# Patient Record
Sex: Female | Born: 1940
Health system: Southern US, Community
[De-identification: ages and names within clinical notes are randomized; demographics above are authoritative.]

## PROBLEM LIST (undated history)

## (undated) DIAGNOSIS — E119 Type 2 diabetes mellitus without complications: Secondary | ICD-10-CM

## (undated) DIAGNOSIS — N189 Chronic kidney disease, unspecified: Secondary | ICD-10-CM

## (undated) DIAGNOSIS — R55 Syncope and collapse: Secondary | ICD-10-CM

## (undated) DIAGNOSIS — K219 Gastro-esophageal reflux disease without esophagitis: Secondary | ICD-10-CM

## (undated) DIAGNOSIS — I10 Essential (primary) hypertension: Secondary | ICD-10-CM

## (undated) DIAGNOSIS — E7800 Pure hypercholesterolemia, unspecified: Secondary | ICD-10-CM

---

## 2003-11-12 ENCOUNTER — Ambulatory Visit: Payer: Self-pay | Admitting: Family Medicine

## 2004-11-16 ENCOUNTER — Ambulatory Visit: Payer: Self-pay | Admitting: Family Medicine

## 2005-11-29 ENCOUNTER — Emergency Department: Payer: Self-pay | Admitting: Emergency Medicine

## 2005-12-14 ENCOUNTER — Ambulatory Visit: Payer: Self-pay | Admitting: Family Medicine

## 2006-08-09 ENCOUNTER — Ambulatory Visit: Payer: Self-pay | Admitting: Gastroenterology

## 2006-09-13 ENCOUNTER — Ambulatory Visit: Payer: Self-pay | Admitting: Gastroenterology

## 2007-03-28 ENCOUNTER — Ambulatory Visit: Payer: Self-pay | Admitting: Family Medicine

## 2007-06-10 ENCOUNTER — Emergency Department: Payer: Self-pay | Admitting: Emergency Medicine

## 2007-06-16 ENCOUNTER — Ambulatory Visit: Payer: Self-pay | Admitting: Family Medicine

## 2008-05-24 ENCOUNTER — Ambulatory Visit: Payer: Self-pay | Admitting: Family Medicine

## 2008-06-06 DIAGNOSIS — E119 Type 2 diabetes mellitus without complications: Secondary | ICD-10-CM | POA: Insufficient documentation

## 2008-06-12 DIAGNOSIS — E1169 Type 2 diabetes mellitus with other specified complication: Secondary | ICD-10-CM | POA: Insufficient documentation

## 2008-06-15 DIAGNOSIS — N183 Chronic kidney disease, stage 3 unspecified: Secondary | ICD-10-CM | POA: Insufficient documentation

## 2008-06-18 LAB — HM PAP SMEAR: HM Pap smear: NORMAL

## 2008-06-26 DIAGNOSIS — D539 Nutritional anemia, unspecified: Secondary | ICD-10-CM | POA: Insufficient documentation

## 2008-06-26 DIAGNOSIS — E559 Vitamin D deficiency, unspecified: Secondary | ICD-10-CM | POA: Insufficient documentation

## 2008-07-05 DIAGNOSIS — E539 Vitamin B deficiency, unspecified: Secondary | ICD-10-CM | POA: Insufficient documentation

## 2008-12-31 ENCOUNTER — Ambulatory Visit: Payer: Self-pay | Admitting: Ophthalmology

## 2009-01-21 ENCOUNTER — Ambulatory Visit: Payer: Self-pay | Admitting: Ophthalmology

## 2009-01-21 HISTORY — PX: EYE SURGERY: SHX253

## 2009-03-20 ENCOUNTER — Ambulatory Visit: Payer: Self-pay | Admitting: Ophthalmology

## 2009-04-08 ENCOUNTER — Ambulatory Visit: Payer: Self-pay | Admitting: Ophthalmology

## 2009-04-08 HISTORY — PX: EYE SURGERY: SHX253

## 2009-05-02 DIAGNOSIS — R0789 Other chest pain: Secondary | ICD-10-CM | POA: Insufficient documentation

## 2009-06-10 ENCOUNTER — Ambulatory Visit: Payer: Self-pay | Admitting: Family Medicine

## 2009-09-22 ENCOUNTER — Ambulatory Visit: Payer: Self-pay | Admitting: General Practice

## 2009-10-08 ENCOUNTER — Inpatient Hospital Stay: Payer: Self-pay | Admitting: General Practice

## 2009-10-08 HISTORY — PX: REPLACEMENT TOTAL KNEE: SUR1224

## 2010-12-21 ENCOUNTER — Emergency Department: Payer: Self-pay | Admitting: Unknown Physician Specialty

## 2011-03-30 ENCOUNTER — Ambulatory Visit: Payer: Self-pay | Admitting: Family Medicine

## 2011-06-01 LAB — HM COLONOSCOPY

## 2012-04-21 ENCOUNTER — Ambulatory Visit: Payer: Self-pay | Admitting: Family Medicine

## 2013-04-23 ENCOUNTER — Ambulatory Visit: Payer: Self-pay | Admitting: Family Medicine

## 2013-06-11 LAB — LIPID PANEL
CHOLESTEROL: 201 mg/dL — AB (ref 0–200)
HDL: 70 mg/dL (ref 35–70)
LDL Cholesterol: 116 mg/dL
Triglycerides: 73 mg/dL (ref 40–160)

## 2013-10-03 LAB — TSH: TSH: 1.31 u[IU]/mL (ref 0.41–5.90)

## 2013-10-03 LAB — BASIC METABOLIC PANEL
BUN: 20 mg/dL (ref 4–21)
CREATININE: 1.2 mg/dL — AB (ref 0.5–1.1)
Glucose: 112 mg/dL
Potassium: 4.7 mmol/L (ref 3.4–5.3)
SODIUM: 138 mmol/L (ref 137–147)

## 2013-10-03 LAB — HEPATIC FUNCTION PANEL
ALT: 9 U/L (ref 7–35)
AST: 11 U/L — AB (ref 13–35)

## 2013-10-03 LAB — CBC AND DIFFERENTIAL
HEMATOCRIT: 33 % — AB (ref 36–46)
HEMOGLOBIN: 11.1 g/dL — AB (ref 12.0–16.0)
PLATELETS: 267 10*3/uL (ref 150–399)
WBC: 5.4 10*3/mL

## 2013-11-20 ENCOUNTER — Ambulatory Visit: Payer: Self-pay | Admitting: Family Medicine

## 2013-11-20 LAB — HM DEXA SCAN: HM Dexa Scan: NORMAL

## 2014-04-03 DIAGNOSIS — E119 Type 2 diabetes mellitus without complications: Secondary | ICD-10-CM | POA: Diagnosis not present

## 2014-04-03 DIAGNOSIS — I10 Essential (primary) hypertension: Secondary | ICD-10-CM | POA: Diagnosis not present

## 2014-04-03 DIAGNOSIS — E780 Pure hypercholesterolemia: Secondary | ICD-10-CM | POA: Diagnosis not present

## 2014-04-03 DIAGNOSIS — H6983 Other specified disorders of Eustachian tube, bilateral: Secondary | ICD-10-CM | POA: Diagnosis not present

## 2014-04-03 LAB — HEMOGLOBIN A1C: Hgb A1c MFr Bld: 6.7 % — AB (ref 4.0–6.0)

## 2014-05-02 ENCOUNTER — Ambulatory Visit
Admit: 2014-05-02 | Disposition: A | Discharge status: Home / Self Care | Payer: Self-pay | Attending: Family Medicine | Admitting: Family Medicine

## 2014-05-02 DIAGNOSIS — Z1231 Encounter for screening mammogram for malignant neoplasm of breast: Secondary | ICD-10-CM | POA: Diagnosis not present

## 2014-05-02 LAB — HM MAMMOGRAPHY

## 2014-05-30 DIAGNOSIS — K219 Gastro-esophageal reflux disease without esophagitis: Secondary | ICD-10-CM | POA: Insufficient documentation

## 2014-05-30 DIAGNOSIS — R202 Paresthesia of skin: Secondary | ICD-10-CM | POA: Insufficient documentation

## 2014-05-30 DIAGNOSIS — H6980 Other specified disorders of Eustachian tube, unspecified ear: Secondary | ICD-10-CM | POA: Insufficient documentation

## 2014-06-27 ENCOUNTER — Telehealth: Payer: Self-pay | Admitting: Family Medicine

## 2014-06-27 NOTE — Telephone Encounter (Signed)
Estill Bamberg from [facility] advised.   Thanks,   -Mickel Baas

## 2014-06-27 NOTE — Telephone Encounter (Signed)
Miranda Olsen with [facility] called to request the Rx for Ferrous Sulfate 325 changed to plain Ferrous Sulfate due to cost.  CB#[phone]/[id number]

## 2014-06-27 NOTE — Telephone Encounter (Signed)
Please call and give verbal approval.

## 2014-07-31 ENCOUNTER — Encounter: Payer: Self-pay | Admitting: Family Medicine

## 2014-07-31 ENCOUNTER — Ambulatory Visit (INDEPENDENT_AMBULATORY_CARE_PROVIDER_SITE_OTHER): Payer: Commercial Managed Care - HMO | Admitting: Family Medicine

## 2014-07-31 VITALS — BP 138/80 | HR 72 | Temp 98.3°F | Resp 16 | Ht 62.0 in | Wt 188.0 lb

## 2014-07-31 DIAGNOSIS — E119 Type 2 diabetes mellitus without complications: Secondary | ICD-10-CM

## 2014-07-31 DIAGNOSIS — E539 Vitamin B deficiency, unspecified: Secondary | ICD-10-CM | POA: Diagnosis not present

## 2014-07-31 DIAGNOSIS — E7800 Pure hypercholesterolemia, unspecified: Secondary | ICD-10-CM

## 2014-07-31 DIAGNOSIS — E559 Vitamin D deficiency, unspecified: Secondary | ICD-10-CM | POA: Diagnosis not present

## 2014-07-31 DIAGNOSIS — K219 Gastro-esophageal reflux disease without esophagitis: Secondary | ICD-10-CM | POA: Diagnosis not present

## 2014-07-31 DIAGNOSIS — N182 Chronic kidney disease, stage 2 (mild): Secondary | ICD-10-CM | POA: Diagnosis not present

## 2014-07-31 DIAGNOSIS — N183 Chronic kidney disease, stage 3 unspecified: Secondary | ICD-10-CM | POA: Insufficient documentation

## 2014-07-31 DIAGNOSIS — I129 Hypertensive chronic kidney disease with stage 1 through stage 4 chronic kidney disease, or unspecified chronic kidney disease: Secondary | ICD-10-CM

## 2014-07-31 DIAGNOSIS — I10 Essential (primary) hypertension: Secondary | ICD-10-CM

## 2014-07-31 LAB — POCT GLYCOSYLATED HEMOGLOBIN (HGB A1C): HEMOGLOBIN A1C: 6.4

## 2014-07-31 NOTE — Progress Notes (Signed)
Subjective:    Patient ID: Miranda Olsen, female    DOB: Aug 19, 1940, 74 y.o.   MRN: [id number]  Hypertension This is a chronic problem. The problem is unchanged. The problem is controlled. Associated symptoms include malaise/fatigue (Occasionally). Pertinent negatives include no anxiety, blurred vision, chest pain, headaches, neck pain, palpitations, peripheral edema, PND, shortness of breath or sweats. There are no compliance problems.   Hyperlipidemia This is a chronic problem. The problem is controlled. Recent lipid tests were reviewed and are high (Last Cholesterol check was 06/11/2013:  Total Cholesterol:  201;  Tri:  73:  HDL:  70;  LDL:  116.). Exacerbating diseases include diabetes. Pertinent negatives include no chest pain, myalgias or shortness of breath. There are no compliance problems.  Risk factors for coronary artery disease include diabetes mellitus and hypertension.  Diabetes She presents for her follow-up diabetic visit. She has type 2 diabetes mellitus. Her disease course has been stable. There are no hypoglycemic associated symptoms. Pertinent negatives for hypoglycemia include no dizziness, headaches, seizures, speech difficulty, sweats or tremors. Associated symptoms include fatigue (occassionally). Pertinent negatives for diabetes include no blurred vision, no chest pain, no foot ulcerations, no polydipsia, no polyphagia, no polyuria, no visual change and no weakness. Symptoms are stable. Current diabetic treatment includes oral agent (monotherapy). She is compliant with treatment all of the time. Her weight is stable. Home blood sugar record trend: Pt does not check her blood sugar at home. She does not see a podiatrist.Eye exam is not current.    Patient Active Problem List   Diagnosis Date Noted  . Dysfunction of eustachian tube 05/30/2014  . Acid reflux 05/30/2014  . Burning or prickling sensation 05/30/2014  . Atypical chest pain 05/02/2009  . Deficiency of vitamin  B 07/05/2008  . Anemia, deficiency 06/26/2008  . Avitaminosis D 06/26/2008  . Benign hypertension 06/15/2008  . Hypercholesteremia 06/12/2008  . Diabetes 06/06/2008  . Colon, diverticulosis 06/06/2008  . Apnea, sleep 01/26/2007  . History of colon polyps 08/09/2006   Family History  Problem Relation Age of Onset  . Parkinsonism Mother   . Leukemia Father   . Cancer Sister     breast  . Congestive Heart Failure Sister   . Congestive Heart Failure Sister   . Lung cancer Brother    History   Social History  . Marital Status: Widowed    Spouse Name: N/A  . Number of Children: 2  . Years of Education: H/S   Occupational History  . Retired    Social History Main Topics  . Smoking status: Never Smoker   . Smokeless tobacco: Never Used  . Alcohol Use: No  . Drug Use: No  . Sexual Activity: Not on file   Other Topics Concern  . Not on file   Social History Narrative   Past Surgical History  Procedure Laterality Date  . Replacement total knee Left 10/08/2009    Dr. Marry Guan  . Eye surgery Left 01/21/2009     cataract surgery   . Eye surgery Right 04/08/2009    cataract surgery   No Known Allergies Previous Medications   AMLODIPINE (NORVASC) 10 MG TABLET    Take by mouth.   BENAZEPRIL (LOTENSIN) 20 MG TABLET    Take by mouth.   CHOLECALCIFEROL (VITAMIN D) 1000 UNITS TABLET    Take by mouth.   CYANOCOBALAMIN 1000 MCG TABLET    Take by mouth.   FERROUS SULFATE 325 (65 FE) MG EC TABLET  Take by mouth.   FLUTICASONE (FLONASE) 50 MCG/ACT NASAL SPRAY    Place into the nose.   HYDROCHLOROTHIAZIDE (HYDRODIURIL) 25 MG TABLET    Take by mouth.   METFORMIN (GLUCOPHAGE) 1000 MG TABLET    Take by mouth.   OMEPRAZOLE (PRILOSEC) 40 MG CAPSULE    Take by mouth.   SIMVASTATIN (ZOCOR) 20 MG TABLET    Take by mouth.   BP 138/80 mmHg  Pulse 72  Temp(Src) 98.3 F (36.8 C) (Oral)  Resp 16  Ht 5\' 2"  (1.575 m)  Wt 188 lb (85.276 kg)  BMI 34.38 kg/m2     Review of Systems   Constitutional: Positive for malaise/fatigue (Occasionally) and fatigue (occassionally). Negative for fever, chills, diaphoresis, activity change, appetite change and unexpected weight change.  Eyes: Negative for blurred vision.  Respiratory: Negative.  Negative for shortness of breath.   Cardiovascular: Positive for leg swelling (Ankles and feet swell.). Negative for chest pain, palpitations and PND.  Gastrointestinal: Negative.  Negative for nausea, vomiting, abdominal pain, diarrhea, constipation, blood in stool, abdominal distention, anal bleeding and rectal pain.  Endocrine: Negative.  Negative for polydipsia, polyphagia and polyuria.  Musculoskeletal: Positive for arthralgias (History of Arthritis). Negative for myalgias, back pain, joint swelling, gait problem, neck pain and neck stiffness.  Neurological: Positive for numbness (Left hand). Negative for dizziness, tremors, seizures, syncope, facial asymmetry, speech difficulty, weakness, light-headedness and headaches.       Objective:   Physical Exam  Constitutional: She is oriented to person, place, and time. She appears well-developed and well-nourished.  HENT:  Head: Normocephalic.  Cardiovascular: Normal rate and regular rhythm.   Neurological: She is alert and oriented to person, place, and time.  Normal monofilament done today.           Assessment & Plan:  1. Benign hypertension Stable, continue current medications. Does have mild CKD and diagnosis code changed.   - TSH  2. Gastroesophageal reflux disease, esophagitis presence not specified Stable on current dose of omeprazole.   3. Type 2 diabetes mellitus without complication [id number] Stable at 6.4%, recheck again in four months. Doing well.  Follow up for Wellness in 2 months also.   - POCT HgB A1C - Comprehensive metabolic panel  4. Hypercholesteremia Stable, check labs today.   - Lipid panel  5. Deficiency of vitamin B Check labs today.  - CBC with  Differential/Platelet - Vitamin B12  6. Avitaminosis D Has not been taking her Vitamin D regularly;  Check labs today.   - Vit D  25 hydroxy (rtn osteoporosis monitoring)  Margarita Rana, MD

## 2014-08-01 ENCOUNTER — Telehealth: Payer: Self-pay

## 2014-08-01 LAB — CBC WITH DIFFERENTIAL/PLATELET
BASOS ABS: 0 10*3/uL (ref 0.0–0.2)
Basos: 0 %
EOS (ABSOLUTE): 0.1 10*3/uL (ref 0.0–0.4)
EOS: 2 %
HEMOGLOBIN: 11.7 g/dL (ref 11.1–15.9)
Hematocrit: 35.7 % (ref 34.0–46.6)
Immature Grans (Abs): 0 10*3/uL (ref 0.0–0.1)
Immature Granulocytes: 0 %
LYMPHS ABS: 1.3 10*3/uL (ref 0.7–3.1)
Lymphs: 25 %
MCH: 28.8 pg (ref 26.6–33.0)
MCHC: 32.8 g/dL (ref 31.5–35.7)
MCV: 88 fL (ref 79–97)
Monocytes Absolute: 0.4 10*3/uL (ref 0.1–0.9)
Monocytes: 8 %
Neutrophils Absolute: 3.4 10*3/uL (ref 1.4–7.0)
Neutrophils: 65 %
Platelets: 277 10*3/uL (ref 150–379)
RBC: 4.06 x10E6/uL (ref 3.77–5.28)
RDW: 13.9 % (ref 12.3–15.4)
WBC: 5.2 10*3/uL (ref 3.4–10.8)

## 2014-08-01 LAB — LIPID PANEL
CHOL/HDL RATIO: 3.1 ratio (ref 0.0–4.4)
CHOLESTEROL TOTAL: 232 mg/dL — AB (ref 100–199)
HDL: 76 mg/dL (ref >39)
LDL Calculated: 137 mg/dL — ABNORMAL HIGH (ref 0–99)
TRIGLYCERIDES: 94 mg/dL (ref 0–149)
VLDL Cholesterol Cal: 19 mg/dL (ref 5–40)

## 2014-08-01 LAB — VITAMIN B12: VITAMIN B 12: 385 pg/mL (ref 211–946)

## 2014-08-01 LAB — COMPREHENSIVE METABOLIC PANEL
A/G RATIO: 1.6 (ref 1.1–2.5)
ALT: 8 IU/L (ref 0–32)
AST: 13 IU/L (ref 0–40)
Albumin: 4.2 g/dL (ref 3.5–4.8)
Alkaline Phosphatase: 53 IU/L (ref 39–117)
BUN/Creatinine Ratio: 20 (ref 11–26)
BUN: 22 mg/dL (ref 8–27)
Bilirubin Total: 0.3 mg/dL (ref 0.0–1.2)
CO2: 24 mmol/L (ref 18–29)
Calcium: 9.9 mg/dL (ref 8.7–10.3)
Chloride: 99 mmol/L (ref 97–108)
Creatinine, Ser: 1.09 mg/dL — ABNORMAL HIGH (ref 0.57–1.00)
GFR calc Af Amer: 59 mL/min/{1.73_m2} — ABNORMAL LOW (ref >59)
GFR calc non Af Amer: 51 mL/min/{1.73_m2} — ABNORMAL LOW (ref >59)
Globulin, Total: 2.7 g/dL (ref 1.5–4.5)
Glucose: 105 mg/dL — ABNORMAL HIGH (ref 65–99)
POTASSIUM: 4.8 mmol/L (ref 3.5–5.2)
SODIUM: 139 mmol/L (ref 134–144)
Total Protein: 6.9 g/dL (ref 6.0–8.5)

## 2014-08-01 LAB — VITAMIN D 25 HYDROXY (VIT D DEFICIENCY, FRACTURES): Vit D, 25-Hydroxy: 42.1 ng/mL (ref 30.0–100.0)

## 2014-08-01 LAB — TSH: TSH: 1.13 u[IU]/mL (ref 0.450–4.500)

## 2014-08-01 NOTE — Telephone Encounter (Signed)
LMTCB 08/01/2014  Thanks,   -Mickel Baas

## 2014-08-01 NOTE — Telephone Encounter (Signed)
-----   Message from Margarita Rana, MD sent at 08/01/2014 11:20 AM EDT ----- Labs stable. Please notify patient and have her continue current medication and plan of care. Thanks.

## 2014-08-05 NOTE — Telephone Encounter (Signed)
Patient advised.

## 2014-10-02 ENCOUNTER — Other Ambulatory Visit: Payer: Self-pay | Admitting: Family Medicine

## 2014-10-02 DIAGNOSIS — D539 Nutritional anemia, unspecified: Secondary | ICD-10-CM

## 2014-10-09 ENCOUNTER — Encounter: Payer: Self-pay | Admitting: Family Medicine

## 2014-10-31 ENCOUNTER — Other Ambulatory Visit: Payer: Self-pay

## 2014-10-31 DIAGNOSIS — E7800 Pure hypercholesterolemia, unspecified: Secondary | ICD-10-CM

## 2014-10-31 MED ORDER — SIMVASTATIN 20 MG PO TABS: 20.0000 mg | ORAL_TABLET | Freq: Every day | ORAL | Status: DC

## 2014-11-29 ENCOUNTER — Encounter: Payer: Commercial Managed Care - HMO | Admitting: Family Medicine

## 2014-12-19 ENCOUNTER — Encounter: Payer: Self-pay | Admitting: Emergency Medicine

## 2014-12-19 ENCOUNTER — Emergency Department
Admission: EM | Admit: 2014-12-19 | Discharge: 2014-12-19 | Disposition: A | Discharge status: Home / Self Care | Payer: No Typology Code available for payment source | Attending: Emergency Medicine | Admitting: Emergency Medicine

## 2014-12-19 ENCOUNTER — Emergency Department: Payer: No Typology Code available for payment source

## 2014-12-19 DIAGNOSIS — Y92410 Unspecified street and highway as the place of occurrence of the external cause: Secondary | ICD-10-CM | POA: Diagnosis not present

## 2014-12-19 DIAGNOSIS — Z79899 Other long term (current) drug therapy: Secondary | ICD-10-CM | POA: Insufficient documentation

## 2014-12-19 DIAGNOSIS — S29001A Unspecified injury of muscle and tendon of front wall of thorax, initial encounter: Secondary | ICD-10-CM | POA: Diagnosis not present

## 2014-12-19 DIAGNOSIS — Y998 Other external cause status: Secondary | ICD-10-CM | POA: Insufficient documentation

## 2014-12-19 DIAGNOSIS — R0789 Other chest pain: Secondary | ICD-10-CM | POA: Diagnosis not present

## 2014-12-19 DIAGNOSIS — I129 Hypertensive chronic kidney disease with stage 1 through stage 4 chronic kidney disease, or unspecified chronic kidney disease: Secondary | ICD-10-CM | POA: Insufficient documentation

## 2014-12-19 DIAGNOSIS — N183 Chronic kidney disease, stage 3 (moderate): Secondary | ICD-10-CM | POA: Diagnosis not present

## 2014-12-19 DIAGNOSIS — S299XXA Unspecified injury of thorax, initial encounter: Secondary | ICD-10-CM

## 2014-12-19 DIAGNOSIS — E119 Type 2 diabetes mellitus without complications: Secondary | ICD-10-CM | POA: Diagnosis not present

## 2014-12-19 DIAGNOSIS — Y9389 Activity, other specified: Secondary | ICD-10-CM | POA: Diagnosis not present

## 2014-12-19 HISTORY — DX: Essential (primary) hypertension: I10

## 2014-12-19 HISTORY — DX: Pure hypercholesterolemia, unspecified: E78.00

## 2014-12-19 HISTORY — DX: Type 2 diabetes mellitus without complications: E11.9

## 2014-12-19 MED ORDER — TRAMADOL HCL 50 MG PO TABS: 50.0000 mg | ORAL_TABLET | Freq: Four times a day (QID) | ORAL | Status: DC | PRN

## 2014-12-19 MED ORDER — IBUPROFEN 600 MG PO TABS
600.0000 mg | ORAL_TABLET | Freq: Once | ORAL | Status: AC
  Administered 2014-12-19: 600 mg via ORAL
  Filled 2014-12-19: qty 1
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
## 2014-12-24 ENCOUNTER — Encounter: Payer: Self-pay | Admitting: Family Medicine

## 2014-12-24 ENCOUNTER — Ambulatory Visit (INDEPENDENT_AMBULATORY_CARE_PROVIDER_SITE_OTHER): Payer: Commercial Managed Care - HMO | Admitting: Family Medicine

## 2014-12-24 VITALS — BP 120/80 | HR 86 | Temp 98.5°F | Resp 16 | Ht 62.0 in | Wt 190.0 lb

## 2014-12-24 DIAGNOSIS — N182 Chronic kidney disease, stage 2 (mild): Secondary | ICD-10-CM | POA: Diagnosis not present

## 2014-12-24 DIAGNOSIS — R42 Dizziness and giddiness: Secondary | ICD-10-CM | POA: Diagnosis not present

## 2014-12-24 DIAGNOSIS — I129 Hypertensive chronic kidney disease with stage 1 through stage 4 chronic kidney disease, or unspecified chronic kidney disease: Secondary | ICD-10-CM | POA: Diagnosis not present

## 2014-12-24 DIAGNOSIS — H8110 Benign paroxysmal vertigo, unspecified ear: Secondary | ICD-10-CM | POA: Diagnosis not present

## 2014-12-24 LAB — GLUCOSE, POCT (MANUAL RESULT ENTRY): POC GLUCOSE: 123 mg/dL — AB (ref 70–99)

## 2014-12-24 MED ORDER — MECLIZINE HCL 25 MG PO TABS: 25.0000 mg | ORAL_TABLET | Freq: Three times a day (TID) | ORAL | Status: DC | PRN
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
## 2015-01-25 ENCOUNTER — Encounter: Payer: Self-pay | Admitting: Internal Medicine

## 2015-01-25 ENCOUNTER — Inpatient Hospital Stay: Payer: Medicare HMO

## 2015-01-25 ENCOUNTER — Inpatient Hospital Stay
Admission: EM | Admit: 2015-01-25 | Discharge: 2015-01-29 | DRG: 378 | Disposition: A | Discharge status: Home / Self Care | Payer: Medicare HMO | Attending: Internal Medicine | Admitting: Internal Medicine

## 2015-01-25 DIAGNOSIS — Z86010 Personal history of colonic polyps: Secondary | ICD-10-CM | POA: Diagnosis not present

## 2015-01-25 DIAGNOSIS — Z806 Family history of leukemia: Secondary | ICD-10-CM | POA: Diagnosis not present

## 2015-01-25 DIAGNOSIS — R197 Diarrhea, unspecified: Secondary | ICD-10-CM | POA: Diagnosis present

## 2015-01-25 DIAGNOSIS — Z801 Family history of malignant neoplasm of trachea, bronchus and lung: Secondary | ICD-10-CM

## 2015-01-25 DIAGNOSIS — E7800 Pure hypercholesterolemia, unspecified: Secondary | ICD-10-CM | POA: Diagnosis present

## 2015-01-25 DIAGNOSIS — K640 First degree hemorrhoids: Secondary | ICD-10-CM | POA: Diagnosis present

## 2015-01-25 DIAGNOSIS — E119 Type 2 diabetes mellitus without complications: Secondary | ICD-10-CM | POA: Diagnosis not present

## 2015-01-25 DIAGNOSIS — E785 Hyperlipidemia, unspecified: Secondary | ICD-10-CM | POA: Diagnosis present

## 2015-01-25 DIAGNOSIS — K649 Unspecified hemorrhoids: Secondary | ICD-10-CM | POA: Diagnosis not present

## 2015-01-25 DIAGNOSIS — N289 Disorder of kidney and ureter, unspecified: Secondary | ICD-10-CM | POA: Diagnosis present

## 2015-01-25 DIAGNOSIS — D62 Acute posthemorrhagic anemia: Secondary | ICD-10-CM | POA: Diagnosis present

## 2015-01-25 DIAGNOSIS — Z820 Family history of epilepsy and other diseases of the nervous system: Secondary | ICD-10-CM | POA: Diagnosis not present

## 2015-01-25 DIAGNOSIS — Z8249 Family history of ischemic heart disease and other diseases of the circulatory system: Secondary | ICD-10-CM | POA: Diagnosis not present

## 2015-01-25 DIAGNOSIS — K625 Hemorrhage of anus and rectum: Secondary | ICD-10-CM | POA: Diagnosis not present

## 2015-01-25 DIAGNOSIS — K922 Gastrointestinal hemorrhage, unspecified: Secondary | ICD-10-CM

## 2015-01-25 DIAGNOSIS — I10 Essential (primary) hypertension: Secondary | ICD-10-CM | POA: Diagnosis present

## 2015-01-25 DIAGNOSIS — K5790 Diverticulosis of intestine, part unspecified, without perforation or abscess without bleeding: Secondary | ICD-10-CM | POA: Diagnosis not present

## 2015-01-25 DIAGNOSIS — Z7982 Long term (current) use of aspirin: Secondary | ICD-10-CM | POA: Diagnosis not present

## 2015-01-25 DIAGNOSIS — D649 Anemia, unspecified: Secondary | ICD-10-CM | POA: Diagnosis not present

## 2015-01-25 DIAGNOSIS — R1030 Lower abdominal pain, unspecified: Secondary | ICD-10-CM | POA: Diagnosis not present

## 2015-01-25 DIAGNOSIS — K5731 Diverticulosis of large intestine without perforation or abscess with bleeding: Principal | ICD-10-CM | POA: Diagnosis present

## 2015-01-25 DIAGNOSIS — K5730 Diverticulosis of large intestine without perforation or abscess without bleeding: Secondary | ICD-10-CM | POA: Diagnosis not present

## 2015-01-25 LAB — CBC
HCT: 31.6 % — ABNORMAL LOW (ref 35.0–47.0)
Hemoglobin: 10.6 g/dL — ABNORMAL LOW (ref 12.0–16.0)
MCH: 29.4 pg (ref 26.0–34.0)
MCHC: 33.4 g/dL (ref 32.0–36.0)
MCV: 88 fL (ref 80.0–100.0)
PLATELETS: 247 10*3/uL (ref 150–440)
RBC: 3.58 MIL/uL — AB (ref 3.80–5.20)
RDW: 13.9 % (ref 11.5–14.5)
WBC: 6.9 10*3/uL (ref 3.6–11.0)

## 2015-01-25 LAB — COMPREHENSIVE METABOLIC PANEL
ALK PHOS: 49 U/L (ref 38–126)
ALT: 11 U/L — AB (ref 14–54)
AST: 12 U/L — ABNORMAL LOW (ref 15–41)
Albumin: 4.1 g/dL (ref 3.5–5.0)
Anion gap: 7 (ref 5–15)
BILIRUBIN TOTAL: 0.5 mg/dL (ref 0.3–1.2)
BUN: 30 mg/dL — ABNORMAL HIGH (ref 6–20)
CALCIUM: 9.3 mg/dL (ref 8.9–10.3)
CHLORIDE: 107 mmol/L (ref 101–111)
CO2: 27 mmol/L (ref 22–32)
CREATININE: 1.38 mg/dL — AB (ref 0.44–1.00)
GFR, EST AFRICAN AMERICAN: 43 mL/min — AB (ref >60)
GFR, EST NON AFRICAN AMERICAN: 37 mL/min — AB (ref >60)
Glucose, Bld: 115 mg/dL — ABNORMAL HIGH (ref 65–99)
Potassium: 4.7 mmol/L (ref 3.5–5.1)
Sodium: 141 mmol/L (ref 135–145)
TOTAL PROTEIN: 7.2 g/dL (ref 6.5–8.1)

## 2015-01-25 LAB — ABO/RH: ABO/RH(D): O POS

## 2015-01-25 LAB — HEMOGLOBIN: Hemoglobin: 8.9 g/dL — ABNORMAL LOW (ref 12.0–16.0)

## 2015-01-25 LAB — PREPARE RBC (CROSSMATCH)

## 2015-01-25 MED ORDER — PANTOPRAZOLE SODIUM 40 MG IV SOLR: 40.0000 mg | Freq: Two times a day (BID) | INTRAVENOUS | Status: DC

## 2015-01-25 MED ORDER — SODIUM CHLORIDE 0.9 % IV SOLN
INTRAVENOUS | Status: DC
  Administered 2015-01-26 – 2015-01-28 (×5): via INTRAVENOUS

## 2015-01-25 MED ORDER — INSULIN ASPART 100 UNIT/ML ~~LOC~~ SOLN: 0.0000 [IU] | Freq: Every day | SUBCUTANEOUS | Status: DC

## 2015-01-25 MED ORDER — TECHNETIUM TC 99M-LABELED RED BLOOD CELLS IV KIT
20.0000 | PACK | Freq: Once | INTRAVENOUS | Status: AC | PRN
  Administered 2015-01-25: 21.69 via INTRAVENOUS

## 2015-01-25 MED ORDER — SODIUM CHLORIDE 0.9 % IJ SOLN
3.0000 mL | Freq: Two times a day (BID) | INTRAMUSCULAR | Status: DC
  Administered 2015-01-26 – 2015-01-28 (×3): 3 mL via INTRAVENOUS

## 2015-01-25 MED ORDER — INSULIN ASPART 100 UNIT/ML ~~LOC~~ SOLN: 0.0000 [IU] | Freq: Three times a day (TID) | SUBCUTANEOUS | Status: DC

## 2015-01-25 MED ORDER — PANTOPRAZOLE SODIUM 40 MG IV SOLR
80.0000 mg | Freq: Once | INTRAVENOUS | Status: AC
  Administered 2015-01-25: 80 mg via INTRAVENOUS
  Filled 2015-01-25: qty 80

## 2015-01-25 MED ORDER — SODIUM CHLORIDE 0.9 % IV BOLUS (SEPSIS)
1000.0000 mL | Freq: Once | INTRAVENOUS | Status: AC
  Administered 2015-01-25: 1000 mL via INTRAVENOUS

## 2015-01-25 MED ORDER — ACETAMINOPHEN 325 MG PO TABS
650.0000 mg | ORAL_TABLET | Freq: Four times a day (QID) | ORAL | Status: DC | PRN
  Administered 2015-01-27: 650 mg via ORAL
  Filled 2015-01-25: qty 2

## 2015-01-25 MED ORDER — SODIUM CHLORIDE 0.9 % IV SOLN
8.0000 mg/h | INTRAVENOUS | Status: DC
  Administered 2015-01-25 – 2015-01-26 (×3): 8 mg/h via INTRAVENOUS
  Filled 2015-01-25 (×5): qty 80

## 2015-01-25 MED ORDER — ACETAMINOPHEN 650 MG RE SUPP: 650.0000 mg | Freq: Four times a day (QID) | RECTAL | Status: DC | PRN

## 2015-01-25 MED ORDER — SODIUM CHLORIDE 0.9 % IV SOLN
Freq: Once | INTRAVENOUS | Status: AC
  Administered 2015-01-25: 20:00:00 via INTRAVENOUS
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
## 2015-01-25 NOTE — ED Provider Notes (Signed)
[facility] Emergency Department Provider Note   ____________________________________________  Time seen: Approximately 5 PM I have reviewed the triage vital signs and the triage nursing note.  HISTORY  Chief Complaint Rectal Bleeding and Diarrhea   Historian Patient, daughter and other family members  HPI Miranda Olsen is a 74 y.o. female with no history of GI bleeding, is here after multiple episodes of dark red blood per rectum today. First episode was this morning around 8 AM, when she thought she had a diarrheal movement but on the toilet paper she saw blood. She had flushed before she saw the amount of blood. She did have another episode this afternoon which was fair amount of dark red blood. In the emergency department waiting room she did have another fairly large episode.  Denies nausea, or vomiting. She does have some mid and lower abdominal cramping which is mild. No exacerbating or alleviating factors. No recent fevers. No trouble breathing or shortness of breath or chest pain.  Does take a baby aspirin no other blood thinners.    Past Medical History  Diagnosis Date  . Hypertension   . Diabetes mellitus without complication ([place])   . Hypercholesterolemia     Patient Active Problem List   Diagnosis Date Noted  . Chronic kidney disease (CKD), stage III (moderate) 07/31/2014  . Dysfunction of eustachian tube 05/30/2014  . Acid reflux 05/30/2014  . Burning or prickling sensation 05/30/2014  . Deficiency of vitamin B 07/05/2008  . Anemia, deficiency 06/26/2008  . Avitaminosis D 06/26/2008  . Benign hypertension with CKD (chronic kidney disease), stage II 06/15/2008  . Hypercholesteremia 06/12/2008  . Diabetes ([place]) 06/06/2008  . Colon, diverticulosis 06/06/2008  . Apnea, sleep 01/26/2007  . History of colon polyps 08/09/2006    Past Surgical History  Procedure Laterality Date  . Replacement total knee Left 10/08/2009    Dr. Marry Guan   . Eye surgery Left 01/21/2009     cataract surgery   . Eye surgery Right 04/08/2009    cataract surgery    Current Outpatient Rx  Name  Route  Sig  Dispense  Refill  . amLODipine (NORVASC) 10 MG tablet   Oral   Take 10 mg by mouth daily.          . benazepril (LOTENSIN) 20 MG tablet   Oral   Take 20 mg by mouth 2 (two) times daily.          . cholecalciferol (VITAMIN D) 1000 UNITS tablet   Oral   Take 1,000 Units by mouth daily.          . cyanocobalamin 1000 MCG tablet   Oral   Take 100 mcg by mouth daily.          . ferrous sulfate 325 (65 FE) MG tablet      TAKE 1 TABLET BY MOUTH EVERY DAY   90 tablet   3   . fluticasone (FLONASE) 50 MCG/ACT nasal spray   Nasal   Place into the nose.         . hydrochlorothiazide (HYDRODIURIL) 25 MG tablet   Oral   Take 25 mg by mouth daily.          . meclizine (ANTIVERT) 25 MG tablet   Oral   Take 1 tablet (25 mg total) by mouth 3 (three) times daily as needed for dizziness.   30 tablet   0   . metFORMIN (GLUCOPHAGE) 1000 MG tablet   Oral  Take 1,000 mg by mouth 2 (two) times daily with a meal.          . omeprazole (PRILOSEC) 40 MG capsule   Oral   Take 40 mg by mouth daily.          . simvastatin (ZOCOR) 20 MG tablet   Oral   Take 1 tablet (20 mg total) by mouth at bedtime.   90 tablet   3     Allergies Review of patient's allergies indicates no known allergies.  Family History  Problem Relation Age of Onset  . Parkinsonism Mother   . Leukemia Father   . Cancer Sister     breast  . Congestive Heart Failure Sister   . Congestive Heart Failure Sister   . Lung cancer Brother     Social History Social History  Substance Use Topics  . Smoking status: Never Smoker   . Smokeless tobacco: Never Used  . Alcohol Use: No    Review of Systems  Constitutional: Negative for fever. Eyes: Negative for visual changes. ENT: Negative for sore throat. Cardiovascular: Negative for chest  pain. Respiratory: Negative for shortness of breath. Gastrointestinal: As per history of present illness. Genitourinary: Negative for dysuria. Musculoskeletal: Negative for back pain. Skin: Negative for rash. Neurological: Negative for headache. 10 point Review of Systems otherwise negative ____________________________________________   PHYSICAL EXAM:  VITAL SIGNS: ED Triage Vitals  Enc Vitals Group     BP 01/25/15 1347 163/101 mmHg     Pulse Rate 01/25/15 1347 107     Resp 01/25/15 1347 18     Temp 01/25/15 1347 98.2 F (36.8 C)     Temp Source 01/25/15 1347 Oral     SpO2 01/25/15 1347 97 %     Weight 01/25/15 1347 190 lb (86.183 kg)     Height 01/25/15 1347 5\' 2"  (1.575 m)     Head Cir --      Peak Flow --      Pain Score --      Pain Loc --      Pain Edu? --      Excl. in [place]? --      Constitutional: Alert and oriented. Well appearing and in no distress. Eyes: Conjunctivae are normal. PERRL. Normal extraocular movements. ENT   Head: Normocephalic and atraumatic.   Nose: No congestion/rhinnorhea.   Mouth/Throat: Mucous membranes are moist.   Neck: No stridor. Cardiovascular/Chest: Normal rate, regular rhythm.  No murmurs, rubs, or gallops. Respiratory: Normal respiratory effort without tachypnea nor retractions. Breath sounds are clear and equal bilaterally. No wheezes/rales/rhonchi. Gastrointestinal: Soft. No distention, no guarding, no rebound. Obese mild lower abdominal tenderness.  Genitourinary/rectal: Deferred rectal visualization.  Large melena witnessed in toilet. Musculoskeletal: Nontender with normal range of motion in all extremities. No joint effusions.  No lower extremity tenderness.  No edema. Neurologic:  Normal speech and language. No gross or focal neurologic deficits are appreciated. Skin:  Skin is warm, dry and intact. No rash noted. Psychiatric: Mood and affect are normal. Speech and behavior are normal. Patient exhibits appropriate  insight and judgment.  ____________________________________________   EKG I, Lisa Roca, MD, the attending physician have personally viewed and interpreted all ECGs.   96 bpm. Normal sinus rhythm. Narrow QRS. Normal axis. Nonspecific ST and T-wave ____________________________________________  LABS (pertinent positives/negatives)  Comprehensive metabolic panel significant for BUN 30 and creatinine 1.38 White blood count 6.9, hemoglobin 10.6 and platelet count 47 Troponin pending  ____________________________________________  RADIOLOGY All Xrays  were viewed by me. Imaging interpreted by Radiologist.  None __________________________________________  PROCEDURES  Procedure(s) performed: None  Critical Care performed: None  ____________________________________________   ED COURSE / ASSESSMENT AND PLAN  CONSULTATIONS: Hospitalist for admission, discussed face-to-face with Dr.Weiting  Pertinent labs & imaging results that were available during my care of the patient were reviewed by me and considered in my medical decision making (see chart for details).  Patient has stable vital signs when I see her, however she did have tachycardia on triage. When I witnessed the melena episode, it is fairly concerning especially that she's had 2 episodes before the blood counts were drawn, and she's had 2 episodes since then. She is also having some cramping now, making me concerned that she may have another one.  She does consent to blood transfusion if needed an emergency situation. At this point I am not ordering it, however if she has additional large bloody bowel movement or any abnormal vital signs, she will need emergency blood.    Patient / Family / Caregiver informed of clinical course, medical decision-making process, and agree with plan.     ___________________________________________   FINAL CLINICAL IMPRESSION(S) / ED DIAGNOSES   Final diagnoses:  Acute GI bleeding        Lisa Roca, MD 01/25/15 1753

## 2015-01-25 NOTE — ED Notes (Signed)
Pt reports 3 episodes of bloody stools.  Takes 81 mg ASA daily.

## 2015-01-25 NOTE — ED Notes (Signed)
Dr. Reita Cliche at bedside to evaluate and discuss plan of care.

## 2015-01-25 NOTE — ED Notes (Signed)
No changes in patient status.  Remains in nuclear med with RN on monitor with protonix and blood transfusing

## 2015-01-25 NOTE — ED Notes (Signed)
Pt was assisted out of bed to commode.   RN remained at bedside,   While on commode pt c/o feeling slightly dizzy she became diaphoretic.   She remained alert and was able to finish her BM,  The stretcher was rolled to her at the commode and she was assisted back onto the stretcher.  And reports no complaints once she is back on the stretcher. She produced approx 350 ml of maroon colored blood.   Dr Leslye Peer was notified of patient episode- output charted in flow sheet

## 2015-01-25 NOTE — Progress Notes (Signed)
Patient ID: Miranda Olsen, female   DOB: July 20, 1940, 74 y.o.   MRN: [id number]  Spoke with Dr. Rayann Heman gastroenterology. We will get a bleeding scan. The nurse drew off another hemoglobin. The patient was actively having another episode of bleeding at this point. I will order 1 unit of blood transfusion. Patient will be admitted to the CCU stepdown for close monitoring.  Dictated by Dr. Loletha Grayer

## 2015-01-25 NOTE — ED Notes (Signed)
Pt going to nuclear med after blood started to get scanned before

## 2015-01-25 NOTE — ED Notes (Signed)
Pt remains in nuclear medicine,   Nurse from CCU came down to assume care and she will be transporting patient to CCU when study is complete.

## 2015-01-25 NOTE — ED Notes (Signed)
Admitting physician at bedside

## 2015-01-25 NOTE — H&P (Signed)
[place] [facility] [place] NAME: Miranda Olsen    MR#:  [id number]  DATE OF BIRTH:  Sep 04, 1940  DATE OF ADMISSION:  01/25/2015  PRIMARY CARE PHYSICIAN: Margarita Rana, MD   REQUESTING/REFERRING PHYSICIAN: Lisa Roca  CHIEF COMPLAINT:   Chief Complaint  Patient presents with  . Rectal Bleeding  . Diarrhea    HISTORY OF PRESENT ILLNESS:  Miranda Olsen  is a 74 y.o. female presented after 3 bleeding episodes today first started at 8 AM. The next was after that and the next was this afternoon. She had 2 more episodes of bleeding here in the hospital with one in the lobby and again one here in the emergency room. Burgundy color to the bleeding. She describes abdominal cramping but not pain prior to the episode. No previous bleeding before. She has had a history of colon polyps in the past. She does take aspirin on a daily basis. First hemoglobin was 10.6 but that was prior to the 2 episodes of bleeding here in the hospital.  PAST MEDICAL HISTORY:   Past Medical History  Diagnosis Date  . Hypertension   . Diabetes mellitus without complication ([place])   . Hypercholesterolemia     PAST SURGICAL HISTORY:   Past Surgical History  Procedure Laterality Date  . Replacement total knee Left 10/08/2009    Dr. Marry Guan  . Eye surgery Left 01/21/2009     cataract surgery   . Eye surgery Right 04/08/2009    cataract surgery    SOCIAL HISTORY:   Social History  Substance Use Topics  . Smoking status: Never Smoker   . Smokeless tobacco: Never Used  . Alcohol Use: 0.6 oz/week    1 Glasses of wine per week    FAMILY HISTORY:   Family History  Problem Relation Age of Onset  . Parkinsonism Mother   . Leukemia Father   . Cancer Sister     breast  . Congestive Heart Failure Sister   . Congestive Heart Failure Sister   . Lung cancer Brother     DRUG ALLERGIES:  No Known Allergies  REVIEW OF SYSTEMS:  CONSTITUTIONAL: No fever.  Positive for fatigue. EYES: No blurred or double vision. Wears glasses EARS, NOSE, AND THROAT: No tinnitus or ear pain. No sore throat RESPIRATORY: No cough, occasional shortness of breath, no wheezing or hemoptysis.  CARDIOVASCULAR: No chest pain, orthopnea, edema.  GASTROINTESTINAL: No nausea, 2 weeks ago she vomited, positive for abdominal cramping. Positive for diarrhea and [place] colored stool GENITOURINARY: No dysuria, hematuria.  ENDOCRINE: No polyuria, nocturia,  HEMATOLOGY: No anemia, easy bruising or bleeding SKIN: No rash or lesion. MUSCULOSKELETAL: Some knee and shoulder pain  NEUROLOGIC: No tingling, numbness, weakness.  PSYCHIATRY: No anxiety or depression.   MEDICATIONS AT HOME:   Prior to Admission medications   Medication Sig Start Date End Date Taking? Authorizing Provider  amLODipine (NORVASC) 10 MG tablet Take 10 mg by mouth daily.  04/03/14  Yes Historical Provider, MD  benazepril (LOTENSIN) 20 MG tablet Take 40 mg by mouth every morning.  06/11/13  Yes Historical Provider, MD  cholecalciferol (VITAMIN D) 1000 UNITS tablet Take 1,000 Units by mouth daily.  03/25/12  Yes Historical Provider, MD  cyanocobalamin 1000 MCG tablet Take 100 mcg by mouth daily.    Yes Historical Provider, MD  ferrous sulfate 325 (65 FE) MG tablet TAKE 1 TABLET BY MOUTH EVERY DAY 10/02/14  Yes Margarita Rana, MD  hydrochlorothiazide (HYDRODIURIL) 25  MG tablet Take 25 mg by mouth daily.  04/07/14  Yes Historical Provider, MD  metFORMIN (GLUCOPHAGE) 1000 MG tablet Take 1,000 mg by mouth 2 (two) times daily with a meal.  05/18/14  Yes Historical Provider, MD  omeprazole (PRILOSEC) 40 MG capsule Take 40 mg by mouth daily.  04/09/14  Yes Historical Provider, MD  simvastatin (ZOCOR) 20 MG tablet Take 1 tablet (20 mg total) by mouth at bedtime. Patient taking differently: Take 20 mg by mouth every morning.  10/31/14  Yes Margarita Rana, MD  aspirin EC 81 MG tablet Take 81 mg by mouth daily.    Historical  Provider, MD      VITAL SIGNS:  Blood pressure 129/79, pulse 98, temperature 98.2 F (36.8 C), temperature source Oral, resp. rate 18, height 5\' 2"  (1.575 m), weight 86.183 kg (190 lb), SpO2 99 %.  PHYSICAL EXAMINATION:  GENERAL:  74 y.o.-year-old patient lying in the bed with no acute distress.  EYES: Pupils equal, round, reactive to light and accommodation. No scleral icterus. Extraocular muscles intact.  HEENT: Head atraumatic, normocephalic. Oropharynx and nasopharynx clear.  NECK:  Supple, no jugular venous distention. No thyroid enlargement, no tenderness.  LUNGS: Normal breath sounds bilaterally, no wheezing, rales,rhonchi or crepitation. No use of accessory muscles of respiration.  CARDIOVASCULAR: S1, S2 normal. No murmurs, rubs, or gallops.  ABDOMEN: Soft, nontender, nondistended. Bowel sounds present. No organomegaly or mass.  EXTREMITIES: No pedal edema, cyanosis, or clubbing.  NEUROLOGIC: Cranial nerves II through XII are intact. Muscle strength 5/5 in all extremities. Sensation intact. Gait not checked.  PSYCHIATRIC: The patient is alert and oriented x 3.  SKIN: No rash, lesion, or ulcer.   LABORATORY PANEL:   CBC  Recent Labs Lab 01/25/15 1354  WBC 6.9  HGB 10.6*  HCT 31.6*  PLT 247   ------------------------------------------------------------------------------------------------------------------  Chemistries   Recent Labs Lab 01/25/15 1354  NA 141  K 4.7  CL 107  CO2 27  GLUCOSE 115*  BUN 30*  CREATININE 1.38*  CALCIUM 9.3  AST 12*  ALT 11*  ALKPHOS 49  BILITOT 0.5   ------------------------------------------------------------------------------------------------------------------   EKG:   Normal sinus rhythm 96 bpm left axis deviation  IMPRESSION AND PLAN:   1. Acute blood loss anemia with gastrointestinal hemorrhage. Blood is burgundy color so it's likely an upper source. Patient will be started on a Protonix bolus and drip. I will stop  aspirin. Serial hemoglobins ordered. Patient agreed to blood transfusion if needed. I explained the benefits and risks of transfusion and answered all questions. I am paging Dr. Rayann Heman gastroenterology. I will leave nothing by mouth after midnight. Will admit to the CCU stepdown for right now. Can consider a bleeding scan if continues to bleed. 2. Hypertension- for right now can continue blood pressure medications 3. Hyperlipidemia unspecified continue statin 4. Type 2 diabetes without complication. Hold Glucophage for right now and put on sliding scale  All the records are reviewed and case discussed with ED provider. Management plans discussed with the patient, family and they are in agreement.  CODE STATUS: Full code  TOTAL TIME TAKING CARE OF THIS PATIENT: 55 minutes.    Loletha Grayer M.D on 01/25/2015 at 6:22 PM  Between 7am to 6pm - Pager - [phone]  After 6pm call admission pager [facility]  Office  [phone]  CC: Primary care physician; Margarita Rana, MD

## 2015-01-26 LAB — URINALYSIS COMPLETE WITH MICROSCOPIC (ARMC ONLY)
Bacteria, UA: NONE SEEN
Bilirubin Urine: NEGATIVE
GLUCOSE, UA: NEGATIVE mg/dL
KETONES UR: NEGATIVE mg/dL
LEUKOCYTES UA: NEGATIVE
NITRITE: NEGATIVE
Protein, ur: NEGATIVE mg/dL
SPECIFIC GRAVITY, URINE: 1.013 (ref 1.005–1.030)
pH: 5 (ref 5.0–8.0)

## 2015-01-26 LAB — BASIC METABOLIC PANEL
Anion gap: 4 — ABNORMAL LOW (ref 5–15)
BUN: 26 mg/dL — AB (ref 6–20)
CO2: 23 mmol/L (ref 22–32)
CREATININE: 1.03 mg/dL — AB (ref 0.44–1.00)
Calcium: 8.2 mg/dL — ABNORMAL LOW (ref 8.9–10.3)
Chloride: 111 mmol/L (ref 101–111)
GFR calc Af Amer: >60 mL/min (ref >60)
GFR, EST NON AFRICAN AMERICAN: 53 mL/min — AB (ref >60)
GLUCOSE: 107 mg/dL — AB (ref 65–99)
Potassium: 3.9 mmol/L (ref 3.5–5.1)
SODIUM: 138 mmol/L (ref 135–145)

## 2015-01-26 LAB — HEMOGLOBIN
HEMOGLOBIN: 7.1 g/dL — AB (ref 12.0–16.0)
Hemoglobin: 8.9 g/dL — ABNORMAL LOW (ref 12.0–16.0)

## 2015-01-26 LAB — CBC
HEMATOCRIT: 26.4 % — AB (ref 35.0–47.0)
Hemoglobin: 8.9 g/dL — ABNORMAL LOW (ref 12.0–16.0)
MCH: 29.1 pg (ref 26.0–34.0)
MCHC: 33.6 g/dL (ref 32.0–36.0)
MCV: 86.6 fL (ref 80.0–100.0)
PLATELETS: 180 10*3/uL (ref 150–440)
RBC: 3.04 MIL/uL — AB (ref 3.80–5.20)
RDW: 13.8 % (ref 11.5–14.5)
WBC: 7.4 10*3/uL (ref 3.6–11.0)

## 2015-01-26 LAB — GLUCOSE, CAPILLARY
GLUCOSE-CAPILLARY: 93 mg/dL (ref 65–99)
GLUCOSE-CAPILLARY: 91 mg/dL (ref 65–99)
Glucose-Capillary: 107 mg/dL — ABNORMAL HIGH (ref 65–99)
Glucose-Capillary: 200 mg/dL — ABNORMAL HIGH (ref 65–99)

## 2015-01-26 LAB — C DIFFICILE QUICK SCREEN W PCR REFLEX
C Diff antigen: NEGATIVE
C Diff interpretation: NEGATIVE
C Diff toxin: NEGATIVE

## 2015-01-26 LAB — MRSA PCR SCREENING: MRSA BY PCR: NEGATIVE

## 2015-01-26 LAB — PREPARE RBC (CROSSMATCH)

## 2015-01-26 MED ORDER — POLYETHYLENE GLYCOL 3350 17 GM/SCOOP PO POWD
1.0000 | Freq: Once | ORAL | Status: AC
  Administered 2015-01-26: 255 g via ORAL
  Filled 2015-01-26: qty 255

## 2015-01-26 MED ORDER — ONDANSETRON HCL 4 MG/2ML IJ SOLN
INTRAMUSCULAR | Status: AC
  Administered 2015-01-26: 21:00:00
  Filled 2015-01-26: qty 2

## 2015-01-26 MED ORDER — BENAZEPRIL HCL 20 MG PO TABS
40.0000 mg | ORAL_TABLET | ORAL | Status: DC
  Administered 2015-01-26 – 2015-01-29 (×4): 40 mg via ORAL
  Filled 2015-01-26 (×4): qty 2

## 2015-01-26 MED ORDER — SIMVASTATIN 20 MG PO TABS
20.0000 mg | ORAL_TABLET | ORAL | Status: DC
  Administered 2015-01-26 – 2015-01-29 (×4): 20 mg via ORAL
  Filled 2015-01-26 (×4): qty 1

## 2015-01-26 MED ORDER — VITAMIN D 1000 UNITS PO TABS
1000.0000 [IU] | ORAL_TABLET | Freq: Every day | ORAL | Status: DC
Start: 2015-01-26 — End: 2015-01-29
  Administered 2015-01-26 – 2015-01-29 (×3): 1000 [IU] via ORAL
  Filled 2015-01-26 (×3): qty 1

## 2015-01-26 MED ORDER — POLYETHYLENE GLYCOL 3350 17 GM/SCOOP PO POWD
1.0000 | Freq: Once | ORAL | Status: AC
  Administered 2015-01-27: 255 g via ORAL
  Filled 2015-01-26: qty 255

## 2015-01-26 MED ORDER — AMLODIPINE BESYLATE 10 MG PO TABS
10.0000 mg | ORAL_TABLET | Freq: Every day | ORAL | Status: DC
  Administered 2015-01-26 – 2015-01-29 (×3): 10 mg via ORAL
  Filled 2015-01-26 (×3): qty 1

## 2015-01-26 MED ORDER — SODIUM CHLORIDE 0.9 % IV SOLN
Freq: Once | INTRAVENOUS | Status: AC
  Administered 2015-01-26: 22:00:00 via INTRAVENOUS

## 2015-01-26 MED ORDER — VITAMIN B-12 100 MCG PO TABS
100.0000 ug | ORAL_TABLET | Freq: Every day | ORAL | Status: DC
  Administered 2015-01-26 – 2015-01-29 (×3): 100 ug via ORAL
  Filled 2015-01-26 (×3): qty 1

## 2015-01-26 NOTE — Progress Notes (Signed)
GI Inpatient Consult Note  Reason for Consult: rectal bleeding   Attending Requesting Consult: [place]  History of Present Illness: Miranda Olsen is a 75 y.o. female with PMH below presenting for evaluation of rectal bleeding.  Reports several episodes of burgundy stool today prior to presenting to ED. Had another episode in the ED.  No prev rectal bleeding.  Last colonoscopy about 3 years ago at [facility] with Dr Tyson Babinski. NO fam hx colon ca.  No anti-coag, does take baby asa.  No abd pain or diarrhea assoc with the bleeding.    In ED, had some hypotension, got fluid and 1U prbc. Went for tagged rbc scan which was negative.   Here in ICU, had several more small episodes of rectal bleeding.  Hgb has been stable but did not respond approp to 1 U prbc.   Denies n/v, c/p, SOB, dysphagia, GERD.   Past Medical History:  Past Medical History  Diagnosis Date  . Hypertension   . Diabetes mellitus without complication ([place])   . Hypercholesterolemia     Problem List: Patient Active Problem List   Diagnosis Date Noted  . Acute blood loss anemia 01/25/2015  . Chronic kidney disease (CKD), stage III (moderate) 07/31/2014  . Dysfunction of eustachian tube 05/30/2014  . Acid reflux 05/30/2014  . Burning or prickling sensation 05/30/2014  . Deficiency of vitamin B 07/05/2008  . Anemia, deficiency 06/26/2008  . Avitaminosis D 06/26/2008  . Benign hypertension with CKD (chronic kidney disease), stage II 06/15/2008  . Hypercholesteremia 06/12/2008  . Diabetes ([place]) 06/06/2008  . Colon, diverticulosis 06/06/2008  . Apnea, sleep 01/26/2007  . History of colon polyps 08/09/2006    Past Surgical History: Past Surgical History  Procedure Laterality Date  . Replacement total knee Left 10/08/2009    Dr. Marry Guan  . Eye surgery Left 01/21/2009     cataract surgery   . Eye surgery Right 04/08/2009    cataract surgery    Allergies: No Known Allergies  Home Medications: Prescriptions prior to  admission  Medication Sig Dispense Refill Last Dose  . amLODipine (NORVASC) 10 MG tablet Take 10 mg by mouth daily.    01/25/2015 at Unknown time  . benazepril (LOTENSIN) 20 MG tablet Take 40 mg by mouth every morning.    01/25/2015 at Unknown time  . cholecalciferol (VITAMIN D) 1000 UNITS tablet Take 1,000 Units by mouth daily.    Past Week at Unknown time  . cyanocobalamin 1000 MCG tablet Take 100 mcg by mouth daily.    Past Week at Unknown time  . ferrous sulfate 325 (65 FE) MG tablet TAKE 1 TABLET BY MOUTH EVERY DAY 90 tablet 3 01/25/2015 at Unknown time  . hydrochlorothiazide (HYDRODIURIL) 25 MG tablet Take 25 mg by mouth daily.    01/25/2015 at Unknown time  . metFORMIN (GLUCOPHAGE) 1000 MG tablet Take 1,000 mg by mouth 2 (two) times daily with a meal.    01/25/2015 at Unknown time  . omeprazole (PRILOSEC) 40 MG capsule Take 40 mg by mouth daily.    01/25/2015 at Unknown time  . simvastatin (ZOCOR) 20 MG tablet Take 1 tablet (20 mg total) by mouth at bedtime. (Patient taking differently: Take 20 mg by mouth every morning. ) 90 tablet 3 01/25/2015 at Unknown time  . aspirin EC 81 MG tablet Take 81 mg by mouth daily.   Not Taking at Unknown time   Home medication reconciliation was completed with the patient.   Scheduled Inpatient Medications:   .  amLODipine  10 mg Oral Daily  . benazepril  40 mg Oral BH-q7a  . cholecalciferol  1,000 Units Oral Daily  . insulin aspart  0-5 Units Subcutaneous QHS  . insulin aspart  0-9 Units Subcutaneous TID WC  . ondansetron      . [START ON 01/29/2015] pantoprazole (PROTONIX) IV  40 mg Intravenous Q12H  . [START ON 01/27/2015] polyethylene glycol powder  1 Container Oral Once  . simvastatin  20 mg Oral BH-q7a  . sodium chloride  3 mL Intravenous Q12H  . cyanocobalamin  100 mcg Oral Daily    Continuous Inpatient Infusions:   . sodium chloride 75 mL/hr at 01/26/15 2136  . pantoprozole (PROTONIX) infusion 8 mg/hr (01/26/15 1532)    PRN Inpatient  Medications:  acetaminophen **OR** acetaminophen  Family History: family history includes Cancer in her sister; Congestive Heart Failure in her sister and sister; Leukemia in her father; Lung cancer in her brother; Parkinsonism in her mother.  The patient's family history is negative for inflammatory bowel disorders, GI malignancy, or solid organ transplantation.  Social History:   reports that she has never smoked. She has never used smokeless tobacco. She reports that she drinks about 0.6 oz of alcohol per week. She reports that she does not use illicit drugs.    Review of Systems: Constitutional: Weight is stable.  Eyes: No changes in vision. ENT: No oral lesions, sore throat.  GI: see HPI.  Heme/Lymph: No easy bruising.  CV: No chest pain.  GU: No hematuria.  Integumentary: No rashes.  Neuro: No headaches.  Psych: No depression/anxiety.  Endocrine: No heat/cold intolerance.  Allergic/Immunologic: No urticaria.  Resp: No cough, SOB.  Musculoskeletal: No joint swelling.    Physical Examination: BP 109/68 mmHg  Pulse 83  Temp(Src) 97.5 F (36.4 C) (Oral)  Resp 16  Ht 5\' 2"  (1.575 m)  Wt 86.183 kg (190 lb)  BMI 34.74 kg/m2  SpO2 100% Gen: NAD, alert and oriented x 4 HEENT: PEERLA, EOMI, Neck: supple, no JVD or thyromegaly Chest: CTA bilaterally, no wheezes, crackles, or other adventitious sounds CV: RRR, no m/g/c/r Abd: soft, NT, ND, +BS in all four quadrants; no HSM, guarding, ridigity, or rebound tenderness Ext: no edema, well perfused with 2+ pulses, Skin: no rash or lesions noted Lymph: no LAD  Data: Lab Results  Component Value Date   WBC 7.4 01/26/2015   HGB 7.1* 01/26/2015   HCT 26.4* 01/26/2015   MCV 86.6 01/26/2015   PLT 180 01/26/2015    Recent Labs Lab 01/26/15 0339 01/26/15 1050 01/26/15 1817  HGB 8.9* 8.9* 7.1*   Lab Results  Component Value Date   NA 138 01/26/2015   K 3.9 01/26/2015   CL 111 01/26/2015   CO2 23 01/26/2015   BUN 26*  01/26/2015   CREATININE 1.03* 01/26/2015   GLU 112 10/03/2013   Lab Results  Component Value Date   ALT 11* 01/25/2015   AST 12* 01/25/2015   ALKPHOS 49 01/25/2015   BILITOT 0.5 01/25/2015   No results for input(s): APTT, INR, PTT in the last 168 hours.   Assessment/Plan: Ms. Bautz is a 75 y.o. female with mutiple episodes of rectal bleeding and inappr response to 1 U prbc.  Last colon 3 years ago.  Tagged scan negative.  Likely diverticular bleeding, but given last colon 3 years ago and ongoing bleeding today, would warrant colonoscopy to further investigate source.   Recommendations:  - colonoscopy tomorrow afternoon - miralax this evening and again  in am - npo after mn except for colon prep - must finish prep by 10 am.   Thank you for the consult. Please call with questions or concerns.  REIN, Grace Blight, MD

## 2015-01-26 NOTE — Progress Notes (Addendum)
Patient transferred from ICU. Settled in room with no complaints. Telemetry box hooked up and verified with CCU, box 40-50. Second RN, Doroteo Bradford verified box as well. Still actively bleeding in stools per CCU nurse with dark red clots. Repeat Hemoglobin drawn around 1830. Resulted at 7.1. Dr. Posey Pronto paged and stated to get in touch with Dr. Rayann Heman. MD stated to transfused one unit of PRBC. Will notify night shift RN or new order.

## 2015-01-26 NOTE — Progress Notes (Signed)
Seen by Dr Rayann Heman and DR V today.Aware of several medium loose bloody stools with clot. Permit for colonoscopy with propofol obtained. Transferred to room 211.

## 2015-01-26 NOTE — Progress Notes (Signed)
eLink Physician-Brief Progress Note Patient Name: Miranda Olsen DOB: 11-Oct-1940 MRN: [id number]   Date of Service  01/26/2015  HPI/Events of Note  75 yo female with PMH of HTN, Hyperlipidemia and Type 2 DM. Presents with UGI bleed. Hgb 10.6 >> 8.9. Patient has been transfused 1 unit PRBC since last Hgb. Current medical regimen includes Protonix IV bolus and infusion, Norvasc, Lotensin, Vit Dm Novolog SSI, Zocor and Vit B-12. H/H being checked Q 8 hours. GI has been consulted. Management per Hospitalist Service.   eICU Interventions  Continue present management.      Intervention Category Evaluation Type: New Patient Evaluation  Lysle Dingwall 01/26/2015, 12:36 AM

## 2015-01-26 NOTE — Progress Notes (Signed)
GI Note:  Full note to follow.    Tagged scan neg but several bloody stools today.    Recs: - colonoscopy tomorrow afternoon - miralax this evening and again in am - npo after mn except for colon prep - must finish prep by 10 am.

## 2015-01-26 NOTE — Progress Notes (Signed)
CRITICAL VALUE ALERT  Critical value received:  hgb 8.9  Date of notification:  01/26/2015  Time of notification:  [id number]  Critical value read back:yes  Nurse who received alert:  Bridgette Habermann RN  MD notified (1st page):  Dr. Darvin Neighbours  Time of first page:  0445  MD notified (2nd page):  Time of second page:  Responding MD:  Sudini  Time MD responded:  [phone]  Continue to monitor. Vital signs look stable.

## 2015-01-26 NOTE — Progress Notes (Signed)
[place] at [place] NAME: Miranda Olsen    MR#:  [id number]  DATE OF BIRTH:  1940/10/05  SUBJECTIVE:  CHIEF COMPLAINT:   Chief Complaint  Patient presents with  . Rectal Bleeding  . Diarrhea   the patient is 75 year old African-American female with past medical history significant for history of hypertension, hyperlipidemia, diabetes mellitus who presents to the hospital with complaints of a 3 rectal bleeding episodes with diarrheal stool. Patient admits of taking aspirin daily. She was presyncopal and had ongoing bleeding in the hospital so she was transfused 1 unit of packed red blood cells after which her hemoglobin level remained stable despite ongoing intermittent bleeding. Patient admits of having some tenesmus in the lower part of abdomen, just before bowel movements.   Review of Systems  Constitutional: Negative for fever, chills and weight loss.  HENT: Negative for congestion.   Eyes: Negative for blurred vision and double vision.  Respiratory: Negative for cough, sputum production, shortness of breath and wheezing.   Cardiovascular: Negative for chest pain, palpitations, orthopnea, leg swelling and PND.  Gastrointestinal: Positive for abdominal pain, diarrhea and blood in stool. Negative for nausea, vomiting and constipation.  Genitourinary: Negative for dysuria, urgency, frequency and hematuria.  Musculoskeletal: Negative for falls.  Neurological: Negative for dizziness, tremors, focal weakness and headaches.  Endo/Heme/Allergies: Does not bruise/bleed easily.  Psychiatric/Behavioral: Negative for depression. The patient does not have insomnia.     VITAL SIGNS: Blood pressure 142/83, pulse 100, temperature 98.3 F (36.8 C), temperature source Oral, resp. rate 18, height 5\' 2"  (1.575 m), weight 86.183 kg (190 lb), SpO2 99 %.  PHYSICAL EXAMINATION:   GENERAL:  75 y.o.-year-old patient lying in the bed with no acute  distress.  EYES: Pupils equal, round, reactive to light and accommodation. No scleral icterus. Extraocular muscles intact.  HEENT: Head atraumatic, normocephalic. Oropharynx and nasopharynx clear.  NECK:  Supple, no jugular venous distention. No thyroid enlargement, no tenderness.  LUNGS: Normal breath sounds bilaterally, no wheezing, rales,rhonchi or crepitation. No use of accessory muscles of respiration.  CARDIOVASCULAR: S1, S2 normal. No murmurs, rubs, or gallops.  ABDOMEN: Soft, mild discomfort in the suprapubic area on palpation but no rebound or guarding was noted, nondistended. Bowel sounds present. No organomegaly or mass.  EXTREMITIES: No pedal edema, cyanosis, or clubbing.  NEUROLOGIC: Cranial nerves II through XII are intact. Muscle strength 5/5 in all extremities. Sensation intact. Gait not checked.  PSYCHIATRIC: The patient is alert and oriented x 3.  SKIN: No obvious rash, lesion, or ulcer.   ORDERS/RESULTS REVIEWED:   CBC  Recent Labs Lab 01/25/15 1354 01/25/15 1827 01/26/15 0339 01/26/15 1050  WBC 6.9  --  7.4  --   HGB 10.6* 8.9* 8.9* 8.9*  HCT 31.6*  --  26.4*  --   PLT 247  --  180  --   MCV 88.0  --  86.6  --   MCH 29.4  --  29.1  --   MCHC 33.4  --  33.6  --   RDW 13.9  --  13.8  --    ------------------------------------------------------------------------------------------------------------------  Chemistries   Recent Labs Lab 01/25/15 1354 01/26/15 0339  NA 141 138  K 4.7 3.9  CL 107 111  CO2 27 23  GLUCOSE 115* 107*  BUN 30* 26*  CREATININE 1.38* 1.03*  CALCIUM 9.3 8.2*  AST 12*  --   ALT 11*  --   ALKPHOS 49  --  BILITOT 0.5  --    ------------------------------------------------------------------------------------------------------------------ estimated creatinine clearance is 49.5 mL/min (by C-G formula based on Cr of  1.03). ------------------------------------------------------------------------------------------------------------------ No results for input(s): TSH, T4TOTAL, T3FREE, THYROIDAB in the last 72 hours.  Invalid input(s): FREET3  Cardiac Enzymes No results for input(s): CKMB, TROPONINI, MYOGLOBIN in the last 168 hours.  Invalid input(s): CK ------------------------------------------------------------------------------------------------------------------ Invalid input(s): POCBNP ---------------------------------------------------------------------------------------------------------------  RADIOLOGY: Nm Gi Blood Loss  01/25/2015  CLINICAL DATA:  Acute onset of GI bleeding.  Initial encounter. EXAM: NUCLEAR MEDICINE GASTROINTESTINAL BLEEDING SCAN TECHNIQUE: Sequential abdominal images were obtained following intravenous administration of Tc-14m labeled red blood cells. RADIOPHARMACEUTICALS:  21.69 mCi Tc-28m in-vitro labeled red cells. COMPARISON:  None. FINDINGS: No definite abnormal accumulation of activity is noted. Normal blood pool is visualized, with activity also pooling at the renal calyces and bladder. There is no definite evidence for acute GI bleed. IMPRESSION: No definite evidence for acute GI bleed. Electronically Signed   By: Garald Balding M.D.   On: 01/25/2015 23:45    EKG:  Orders placed or performed during the hospital encounter of 01/25/15  . EKG 12-Lead  . EKG 12-Lead    ASSESSMENT AND PLAN:  Active Problems:   Acute blood loss anemia 1. Acute posthemorrhagic anemia due to gastrointestinal bleed, status post 1 unit packed red blood cell transfusion, follow hemoglobin level and transfuse as needed 2. Gastrointestinal bleed, suspected lower, bleeding scan was negative, repeat bleeding scan if bleeding gets significant, continue Protonix intravenously and awaiting for gastroenterologist evaluation 3. Diarrheal stool, unclear if related to gastrointestinal bleed, get C.  Difficile 4. Renal insufficiency, continue IV fluids and follow kidney function closely, get urinalysis 5 . Diabetes mellitus. Continue patient on sliding scale insulin. Initiate clear liquid diet,  6. Essential hypertension. Continue outpatient medications. Blood pressure is well controlled   Management plans discussed with the patient, family and they are in agreement.   DRUG ALLERGIES: No Known Allergies  CODE STATUS:     Code Status Orders        Start     Ordered   01/25/15 1813  Full code   Continuous     01/25/15 1813      TOTAL TIME TAKING CARE OF THIS PATIENT: 50 minutes.    discussed this patient's daughter and other family members. Time spent in discussions approximately 10-15 minutes  Domenik Trice M.D on 01/26/2015 at 1:41 PM  Between 7am to 6pm - Pager - [phone]  After 6pm go to [email or web address] - password [email or web address] [facility]  [facility]  Office  [phone]  CC: Primary care physician; Margarita Rana, MD

## 2015-01-26 NOTE — Progress Notes (Signed)
Stat hemoglobin for 0215 drawn now. Awaiting results.

## 2015-01-26 NOTE — Plan of Care (Signed)
Problem: Education: Goal: Knowledge of [facility] Education information/materials will improve Outcome: Progressing Educated by Dr Rayann Heman, Dr V and myself regarding GI bleed, colonoscopy and serial H& H's  Problem: Safety: Goal: Ability to remain free from injury will improve Outcome: Progressing Up with assist.Steady on feet. No Injury  Problem: Health Behavior/Discharge Planning: Goal: Ability to manage health-related needs will improve Outcome: Progressing Plans to return to her home at discharge.  Problem: Pain Managment: Goal: General experience of comfort will improve Outcome: Progressing Denies pain and cramping  Problem: Physical Regulation: Goal: Ability to maintain clinical measurements within normal limits will improve Outcome: Progressing VSS.  Tolerates antihypertensives Goal: Will remain free from infection Outcome: Progressing No sign of infection.  Afebrile. Urine and stool specimens sent to lab  Problem: Skin Integrity: Goal: Risk for impaired skin integrity will decrease Outcome: Progressing No impaired skin. Bath done and positions changed frequently by pt.  Problem: Activity: Goal: Risk for activity intolerance will decrease Outcome: Progressing Up to [facility] several times.  Mild generalized weakness.  No dizziness  Problem: Fluid Volume: Goal: Ability to maintain a balanced intake and output will improve Outcome: Progressing NS at 22ml/hr.  Clear liquid diet.. Hgb stable. Loose bloody stool with dark clots.  Last stool dark clot only  Problem: Nutrition: Goal: Adequate nutrition will be maintained Outcome: Progressing Clear liquid diet tolerated without cramping  Problem: Bowel/Gastric: Goal: Will not experience complications related to bowel motility Outcome: Progressing Hgb stable at present.  Bloody stools with dark clots.  Dr Rayann Heman and DR V aware.

## 2015-01-27 ENCOUNTER — Inpatient Hospital Stay: Payer: Medicare HMO | Admitting: Anesthesiology

## 2015-01-27 ENCOUNTER — Encounter
Admission: EM | Disposition: A | Discharge status: Home / Self Care | Payer: Self-pay | Source: Home / Self Care | Attending: Internal Medicine

## 2015-01-27 ENCOUNTER — Encounter: Payer: Self-pay | Admitting: *Deleted

## 2015-01-27 HISTORY — PX: COLONOSCOPY WITH PROPOFOL: SHX5780

## 2015-01-27 LAB — BASIC METABOLIC PANEL
ANION GAP: 5 (ref 5–15)
BUN: 20 mg/dL (ref 6–20)
CHLORIDE: 111 mmol/L (ref 101–111)
CO2: 22 mmol/L (ref 22–32)
Calcium: 8 mg/dL — ABNORMAL LOW (ref 8.9–10.3)
Creatinine, Ser: 1 mg/dL (ref 0.44–1.00)
GFR calc Af Amer: >60 mL/min (ref >60)
GFR, EST NON AFRICAN AMERICAN: 55 mL/min — AB (ref >60)
GLUCOSE: 132 mg/dL — AB (ref 65–99)
POTASSIUM: 4.1 mmol/L (ref 3.5–5.1)
Sodium: 138 mmol/L (ref 135–145)

## 2015-01-27 LAB — GLUCOSE, CAPILLARY
GLUCOSE-CAPILLARY: 89 mg/dL (ref 65–99)
GLUCOSE-CAPILLARY: 155 mg/dL — AB (ref 65–99)
Glucose-Capillary: 131 mg/dL — ABNORMAL HIGH (ref 65–99)
Glucose-Capillary: 100 mg/dL — ABNORMAL HIGH (ref 65–99)

## 2015-01-27 LAB — MAGNESIUM: Magnesium: 1.7 mg/dL (ref 1.7–2.4)

## 2015-01-27 LAB — HEMOGLOBIN
Hemoglobin: 8.3 g/dL — ABNORMAL LOW (ref 12.0–16.0)
Hemoglobin: 7.8 g/dL — ABNORMAL LOW (ref 12.0–16.0)

## 2015-01-27 SURGERY — COLONOSCOPY WITH PROPOFOL
Anesthesia: Moderate Sedation

## 2015-01-27 SURGERY — COLONOSCOPY WITH PROPOFOL
Anesthesia: General

## 2015-01-27 MED ORDER — ONDANSETRON HCL 4 MG/2ML IJ SOLN: 4.0000 mg | Freq: Four times a day (QID) | INTRAMUSCULAR | Status: DC | PRN

## 2015-01-27 MED ORDER — PROPOFOL 500 MG/50ML IV EMUL
INTRAVENOUS | Status: DC | PRN
  Administered 2015-01-27: 75 ug/kg/min via INTRAVENOUS

## 2015-01-27 MED ORDER — SODIUM CHLORIDE 0.9 % IV SOLN
INTRAVENOUS | Status: DC | PRN
  Administered 2015-01-27: 14:00:00 via INTRAVENOUS

## 2015-01-27 NOTE — Anesthesia Preprocedure Evaluation (Addendum)
Anesthesia Evaluation  Patient identified by MRN, date of birth, ID band  Airway Mallampati: II       Dental  (+) Teeth Intact   Pulmonary    breath sounds clear to auscultation       Cardiovascular hypertension,  Rhythm:Regular     Neuro/Psych    GI/Hepatic   Endo/Other  diabetes  Renal/GU      Musculoskeletal   Abdominal (+) + obese,   Peds  Hematology   Anesthesia Other Findings   Reproductive/Obstetrics                            Anesthesia Physical Anesthesia Plan  ASA: II  Anesthesia Plan: General   Post-op Pain Management:    Induction: Intravenous  Airway Management Planned: Nasal Cannula  Additional Equipment:   Intra-op Plan:   Post-operative Plan:   Informed Consent: I have reviewed the patients History and Physical, chart, labs and discussed the procedure including the risks, benefits and alternatives for the proposed anesthesia with the patient or authorized representative who has indicated his/her understanding and acceptance.     Plan Discussed with: CRNA  Anesthesia Plan Comments:         Anesthesia Quick Evaluation

## 2015-01-27 NOTE — Op Note (Signed)
[facility] Patient Name: Miranda Olsen Procedure Date: 01/27/2015 2:14 PM MRN: [id number] Account #: [id number] Date of Birth: March 09, 1940 Admit Type: Inpatient Age: 75 Room: [facility] ENDO ROOM 1 Gender: Female Note Status: Finalized Procedure:         Colonoscopy Indications:       Last colonoscopy 3 years ago, Hematochezia Patient Profile:   This is a 75 year old female. Providers:         Gerrit Heck. Rayann Heman, MD Referring MD:      Jerrell Belfast, MD (Referring MD) Medicines:         Propofol per Anesthesia Complications:     No immediate complications. Procedure:         Pre-Anesthesia Assessment:                    - Prior to the procedure, a History and Physical was                     performed, and patient medications, allergies and                     sensitivities were reviewed. The patient's tolerance of                     previous anesthesia was reviewed.                    After obtaining informed consent, the colonoscope was                     passed under direct vision. Throughout the procedure, the                     patient's blood pressure, pulse, and oxygen saturations                     were monitored continuously. The Colonoscope was                     introduced through the anus and advanced to the the cecum,                     identified by appendiceal orifice and ileocecal valve. The                     colonoscopy was performed without difficulty. The patient                     tolerated the procedure well. The quality of the bowel                     preparation was good. Findings:      The perianal and digital rectal examinations were normal.      Many large-mouthed diverticula were found in the sigmoid colon and in       the descending colon.      A few small and large-mouthed diverticula were found at the hepatic       flexure and in the ascending colon.      Internal hemorrhoids were found during retroflexion. The  hemorrhoids       were Grade I (internal hemorrhoids that do not prolapse).      The exam was otherwise without abnormality. Impression:        - Diverticulosis in the sigmoid colon  and in the                     descending colon.                    - Diverticulosis at the hepatic flexure and in the                     ascending colon.                    - Internal hemorrhoids.                    - The examination was otherwise normal.                    - Likely source of bleeding is diverticular.                    - No specimens collected. Recommendation:    - Observe patient in GI recovery unit.                    - Resume regular diet.                    - Continue present medications.                    - Repeat colonoscopy for screening of surveillance in 5                     years if hx of adenomas, otherwise repeat in 10 years.                    - safe for d/c once Hgb stable                    - The findings and recommendations were discussed with the                     patient.                    - The findings and recommendations were discussed with the                     patient's family. Procedure Code(s): --- Professional ---                    [phone], Colonoscopy, flexible; diagnostic, including                     collection of specimen(s) by brushing or washing, when                     performed (separate procedure) Diagnosis Code(s): --- Professional ---                    K64.0, First degree hemorrhoids                    K92.1, Melena                    K57.30, Diverticulosis of large intestine without                     perforation or abscess without bleeding CPT copyright 2014 American Medical Association. All rights reserved. The  codes documented in this report are preliminary and upon coder review may  be revised to meet current compliance requirements. Mellody Life, MD 01/27/2015 2:35:23 PM This report has been signed electronically. Number of  Addenda: 0 Note Initiated On: 01/27/2015 2:14 PM Scope Withdrawal Time: 0 hours 5 minutes 36 seconds  Total Procedure Duration: 0 hours 12 minutes 17 seconds       [facility]

## 2015-01-27 NOTE — Progress Notes (Signed)
Miranda Olsen has just finished drinking the last of the prep for her colonoscopy later today.

## 2015-01-27 NOTE — Care Management Important Message (Signed)
Important Message  Patient Details  Name: Miranda Olsen MRN: [id number] Date of Birth: 05-14-1940   Medicare Important Message Given:  Yes    Juliann Pulse A Elliannah Wayment 01/27/2015, 10:40 AM

## 2015-01-27 NOTE — Transfer of Care (Signed)
Immediate Anesthesia Transfer of Care Note  Patient: Miranda Olsen  Procedure(s) Performed: Procedure(s): COLONOSCOPY WITH PROPOFOL (N/A)  Patient Location: PACU  Anesthesia Type:General  Level of Consciousness: awake and alert   Airway & Oxygen Therapy: Patient Spontanous Breathing  Post-op Assessment: Report given to RN  Post vital signs: stable  Last Vitals:  Filed Vitals:   01/27/15 0002 01/27/15 0647  BP: 122/67 130/67  Pulse: 85 83  Temp: 36.8 C 36.7 C  Resp: 16     Complications: No apparent anesthesia complications

## 2015-01-27 NOTE — Progress Notes (Signed)
[place] at [place] NAME: Miranda Olsen    MR#:  [id number]  DATE OF BIRTH:  1940-02-06  SUBJECTIVE:  CHIEF COMPLAINT:   Chief Complaint  Patient presents with  . Rectal Bleeding  . Diarrhea   the patient is 75 year old African-American female with past medical history significant for history of hypertension, hyperlipidemia, diabetes mellitus who presents to the hospital with complaints of a 3 rectal bleeding episodes with diarrheal stool. Patient admits of taking aspirin daily. She was presyncopal and had ongoing bleeding in the hospital so she was transfused 2 unit of packed red blood cells. Satisfactory today. Denies having diarrheal stool or bleeding. Underwent colonoscopy which revealed diverticulosis and internal hemorrhoids. No bleeding was noted. Suspected bleeding source was diverticulosis per gastroenterologist. Regular diet as ordered  Review of Systems  Constitutional: Negative for fever, chills and weight loss.  HENT: Negative for congestion.   Eyes: Negative for blurred vision and double vision.  Respiratory: Negative for cough, sputum production, shortness of breath and wheezing.   Cardiovascular: Negative for chest pain, palpitations, orthopnea, leg swelling and PND.  Gastrointestinal: Positive for abdominal pain, diarrhea and blood in stool. Negative for nausea, vomiting and constipation.  Genitourinary: Negative for dysuria, urgency, frequency and hematuria.  Musculoskeletal: Negative for falls.  Neurological: Negative for dizziness, tremors, focal weakness and headaches.  Endo/Heme/Allergies: Does not bruise/bleed easily.  Psychiatric/Behavioral: Negative for depression. The patient does not have insomnia.     VITAL SIGNS: Blood pressure 107/69, pulse 84, temperature 97 F (36.1 C), temperature source Oral, resp. rate 18, height 5\' 2"  (1.575 m), weight 86.183 kg (190 lb), SpO2 100 %.  PHYSICAL EXAMINATION:    GENERAL:  75 y.o.-year-old patient lying in the bed with no acute distress.  EYES: Pupils equal, round, reactive to light and accommodation. No scleral icterus. Extraocular muscles intact.  HEENT: Head atraumatic, normocephalic. Oropharynx and nasopharynx clear.  NECK:  Supple, no jugular venous distention. No thyroid enlargement, no tenderness.  LUNGS: Normal breath sounds bilaterally, no wheezing, rales,rhonchi or crepitation. No use of accessory muscles of respiration.  CARDIOVASCULAR: S1, S2 normal. No murmurs, rubs, or gallops.  ABDOMEN: Soft, mild discomfort in the suprapubic area on palpation but no rebound or guarding was noted, nondistended. Bowel sounds present. No organomegaly or mass.  EXTREMITIES: No pedal edema, cyanosis, or clubbing.  NEUROLOGIC: Cranial nerves II through XII are intact. Muscle strength 5/5 in all extremities. Sensation intact. Gait not checked.  PSYCHIATRIC: The patient is alert and oriented x 3.  SKIN: No obvious rash, lesion, or ulcer.   ORDERS/RESULTS REVIEWED:   CBC  Recent Labs Lab 01/25/15 1354  01/26/15 0339 01/26/15 1050 01/26/15 1817 01/27/15 0220 01/27/15 1002  WBC 6.9  --  7.4  --   --   --   --   HGB 10.6*  < > 8.9* 8.9* 7.1* 8.3* 7.8*  HCT 31.6*  --  26.4*  --   --   --   --   PLT 247  --  180  --   --   --   --   MCV 88.0  --  86.6  --   --   --   --   MCH 29.4  --  29.1  --   --   --   --   MCHC 33.4  --  33.6  --   --   --   --   RDW 13.9  --  13.8  --   --   --   --   < > =  values in this interval not displayed. ------------------------------------------------------------------------------------------------------------------  Chemistries   Recent Labs Lab 01/25/15 1354 01/26/15 0339 01/27/15 0220  NA 141 138 138  K 4.7 3.9 4.1  CL 107 111 111  CO2 27 23 22   GLUCOSE 115* 107* 132*  BUN 30* 26* 20  CREATININE 1.38* 1.03* 1.00  CALCIUM 9.3 8.2* 8.0*  MG  --   --  1.7  AST 12*  --   --   ALT 11*  --   --   ALKPHOS  49  --   --   BILITOT 0.5  --   --    ------------------------------------------------------------------------------------------------------------------ estimated creatinine clearance is 51 mL/min (by C-G formula based on Cr of 1). ------------------------------------------------------------------------------------------------------------------ No results for input(s): TSH, T4TOTAL, T3FREE, THYROIDAB in the last 72 hours.  Invalid input(s): FREET3  Cardiac Enzymes No results for input(s): CKMB, TROPONINI, MYOGLOBIN in the last 168 hours.  Invalid input(s): CK ------------------------------------------------------------------------------------------------------------------ Invalid input(s): POCBNP ---------------------------------------------------------------------------------------------------------------  RADIOLOGY: Nm Gi Blood Loss  01/25/2015  CLINICAL DATA:  Acute onset of GI bleeding.  Initial encounter. EXAM: NUCLEAR MEDICINE GASTROINTESTINAL BLEEDING SCAN TECHNIQUE: Sequential abdominal images were obtained following intravenous administration of Tc-59m labeled red blood cells. RADIOPHARMACEUTICALS:  21.69 mCi Tc-37m in-vitro labeled red cells. COMPARISON:  None. FINDINGS: No definite abnormal accumulation of activity is noted. Normal blood pool is visualized, with activity also pooling at the renal calyces and bladder. There is no definite evidence for acute GI bleed. IMPRESSION: No definite evidence for acute GI bleed. Electronically Signed   By: Garald Balding M.D.   On: 01/25/2015 23:45    EKG:  Orders placed or performed during the hospital encounter of 01/25/15  . EKG 12-Lead  . EKG 12-Lead    ASSESSMENT AND PLAN:  Active Problems:   Acute blood loss anemia 1. Acute posthemorrhagic anemia due to gastrointestinal bleed, suspected diverticular, status post 2 unit packed red blood cell transfusion, follow hemoglobin level tomorrow morning and transfuse as needed 2.  Gastrointestinal bleed, suspected diverticular, bleeding scan was negative, continue Protonix orally , patient underwent colonoscopy revealing diverticular disease of colon , also internal hemorrhoids , diet was advanced , follow clinically and discharge patient home if no recurrent bleeding , appreciate gastroenterologist input 3. Diarrheal stool, negative for C. Difficile 4. Renal insufficiency, resolved with blood transfusion and IV fluids , urinalysis was normal 5 . Diabetes mellitus. Continue patient on sliding scale insulin. Initiated diabetic diet  6. Essential hypertension. Continue outpatient medications. Blood pressure is well controlled   Management plans discussed with the patient, family and they are in agreement.   DRUG ALLERGIES: No Known Allergies  CODE STATUS:     Code Status Orders        Start     Ordered   01/25/15 1813  Full code   Continuous     01/25/15 1813      TOTAL TIME TAKING CARE OF THIS PATIENT: 35 minutes.    discussed this patient's daughter   Theodoro Grist M.D on 01/27/2015 at 4:13 PM  Between 7am to 6pm - Pager - [phone]  After 6pm go to [email or web address] - password [email or web address] [facility]  [facility]  Office  [phone]  CC: Primary care physician; Margarita Rana, MD

## 2015-01-27 NOTE — Anesthesia Postprocedure Evaluation (Signed)
Anesthesia Post Note  Patient: Oswaldo Conroy  Procedure(s) Performed: Procedure(s) (LRB): COLONOSCOPY WITH PROPOFOL (N/A)  Patient location during evaluation: PACU Anesthesia Type: General Level of consciousness: awake Pain management: pain level controlled Vital Signs Assessment: post-procedure vital signs reviewed and stable Respiratory status: spontaneous breathing Cardiovascular status: stable Anesthetic complications: no    Last Vitals:  Filed Vitals:   01/27/15 1438 01/27/15 1449  BP: 96/63 107/69  Pulse: 84   Temp: 36.6 C   Resp: 18     Last Pain: There were no vitals filed for this visit.               VAN STAVEREN,Brei Pociask

## 2015-01-27 NOTE — H&P (View-Only) (Signed)
GI Inpatient Consult Note  Reason for Consult: rectal bleeding   Attending Requesting Consult: [place]  History of Present Illness: Miranda Olsen is a 75 y.o. female with PMH below presenting for evaluation of rectal bleeding.  Reports several episodes of burgundy stool today prior to presenting to ED. Had another episode in the ED.  No prev rectal bleeding.  Last colonoscopy about 3 years ago at [facility] with Dr Tyson Babinski. NO fam hx colon ca.  No anti-coag, does take baby asa.  No abd pain or diarrhea assoc with the bleeding.    In ED, had some hypotension, got fluid and 1U prbc. Went for tagged rbc scan which was negative.   Here in ICU, had several more small episodes of rectal bleeding.  Hgb has been stable but did not respond approp to 1 U prbc.   Denies n/v, c/p, SOB, dysphagia, GERD.   Past Medical History:  Past Medical History  Diagnosis Date  . Hypertension   . Diabetes mellitus without complication ([place])   . Hypercholesterolemia     Problem List: Patient Active Problem List   Diagnosis Date Noted  . Acute blood loss anemia 01/25/2015  . Chronic kidney disease (CKD), stage III (moderate) 07/31/2014  . Dysfunction of eustachian tube 05/30/2014  . Acid reflux 05/30/2014  . Burning or prickling sensation 05/30/2014  . Deficiency of vitamin B 07/05/2008  . Anemia, deficiency 06/26/2008  . Avitaminosis D 06/26/2008  . Benign hypertension with CKD (chronic kidney disease), stage II 06/15/2008  . Hypercholesteremia 06/12/2008  . Diabetes ([place]) 06/06/2008  . Colon, diverticulosis 06/06/2008  . Apnea, sleep 01/26/2007  . History of colon polyps 08/09/2006    Past Surgical History: Past Surgical History  Procedure Laterality Date  . Replacement total knee Left 10/08/2009    Dr. Marry Guan  . Eye surgery Left 01/21/2009     cataract surgery   . Eye surgery Right 04/08/2009    cataract surgery    Allergies: No Known Allergies  Home Medications: Prescriptions prior to  admission  Medication Sig Dispense Refill Last Dose  . amLODipine (NORVASC) 10 MG tablet Take 10 mg by mouth daily.    01/25/2015 at Unknown time  . benazepril (LOTENSIN) 20 MG tablet Take 40 mg by mouth every morning.    01/25/2015 at Unknown time  . cholecalciferol (VITAMIN D) 1000 UNITS tablet Take 1,000 Units by mouth daily.    Past Week at Unknown time  . cyanocobalamin 1000 MCG tablet Take 100 mcg by mouth daily.    Past Week at Unknown time  . ferrous sulfate 325 (65 FE) MG tablet TAKE 1 TABLET BY MOUTH EVERY DAY 90 tablet 3 01/25/2015 at Unknown time  . hydrochlorothiazide (HYDRODIURIL) 25 MG tablet Take 25 mg by mouth daily.    01/25/2015 at Unknown time  . metFORMIN (GLUCOPHAGE) 1000 MG tablet Take 1,000 mg by mouth 2 (two) times daily with a meal.    01/25/2015 at Unknown time  . omeprazole (PRILOSEC) 40 MG capsule Take 40 mg by mouth daily.    01/25/2015 at Unknown time  . simvastatin (ZOCOR) 20 MG tablet Take 1 tablet (20 mg total) by mouth at bedtime. (Patient taking differently: Take 20 mg by mouth every morning. ) 90 tablet 3 01/25/2015 at Unknown time  . aspirin EC 81 MG tablet Take 81 mg by mouth daily.   Not Taking at Unknown time   Home medication reconciliation was completed with the patient.   Scheduled Inpatient Medications:   .  amLODipine  10 mg Oral Daily  . benazepril  40 mg Oral BH-q7a  . cholecalciferol  1,000 Units Oral Daily  . insulin aspart  0-5 Units Subcutaneous QHS  . insulin aspart  0-9 Units Subcutaneous TID WC  . ondansetron      . [START ON 01/29/2015] pantoprazole (PROTONIX) IV  40 mg Intravenous Q12H  . [START ON 01/27/2015] polyethylene glycol powder  1 Container Oral Once  . simvastatin  20 mg Oral BH-q7a  . sodium chloride  3 mL Intravenous Q12H  . cyanocobalamin  100 mcg Oral Daily    Continuous Inpatient Infusions:   . sodium chloride 75 mL/hr at 01/26/15 2136  . pantoprozole (PROTONIX) infusion 8 mg/hr (01/26/15 1532)    PRN Inpatient  Medications:  acetaminophen **OR** acetaminophen  Family History: family history includes Cancer in her sister; Congestive Heart Failure in her sister and sister; Leukemia in her father; Lung cancer in her brother; Parkinsonism in her mother.  The patient's family history is negative for inflammatory bowel disorders, GI malignancy, or solid organ transplantation.  Social History:   reports that she has never smoked. She has never used smokeless tobacco. She reports that she drinks about 0.6 oz of alcohol per week. She reports that she does not use illicit drugs.    Review of Systems: Constitutional: Weight is stable.  Eyes: No changes in vision. ENT: No oral lesions, sore throat.  GI: see HPI.  Heme/Lymph: No easy bruising.  CV: No chest pain.  GU: No hematuria.  Integumentary: No rashes.  Neuro: No headaches.  Psych: No depression/anxiety.  Endocrine: No heat/cold intolerance.  Allergic/Immunologic: No urticaria.  Resp: No cough, SOB.  Musculoskeletal: No joint swelling.    Physical Examination: BP 109/68 mmHg  Pulse 83  Temp(Src) 97.5 F (36.4 C) (Oral)  Resp 16  Ht 5\' 2"  (1.575 m)  Wt 86.183 kg (190 lb)  BMI 34.74 kg/m2  SpO2 100% Gen: NAD, alert and oriented x 4 HEENT: PEERLA, EOMI, Neck: supple, no JVD or thyromegaly Chest: CTA bilaterally, no wheezes, crackles, or other adventitious sounds CV: RRR, no m/g/c/r Abd: soft, NT, ND, +BS in all four quadrants; no HSM, guarding, ridigity, or rebound tenderness Ext: no edema, well perfused with 2+ pulses, Skin: no rash or lesions noted Lymph: no LAD  Data: Lab Results  Component Value Date   WBC 7.4 01/26/2015   HGB 7.1* 01/26/2015   HCT 26.4* 01/26/2015   MCV 86.6 01/26/2015   PLT 180 01/26/2015    Recent Labs Lab 01/26/15 0339 01/26/15 1050 01/26/15 1817  HGB 8.9* 8.9* 7.1*   Lab Results  Component Value Date   NA 138 01/26/2015   K 3.9 01/26/2015   CL 111 01/26/2015   CO2 23 01/26/2015   BUN 26*  01/26/2015   CREATININE 1.03* 01/26/2015   GLU 112 10/03/2013   Lab Results  Component Value Date   ALT 11* 01/25/2015   AST 12* 01/25/2015   ALKPHOS 49 01/25/2015   BILITOT 0.5 01/25/2015   No results for input(s): APTT, INR, PTT in the last 168 hours.   Assessment/Plan: Ms. Tanis is a 75 y.o. female with mutiple episodes of rectal bleeding and inappr response to 1 U prbc.  Last colon 3 years ago.  Tagged scan negative.  Likely diverticular bleeding, but given last colon 3 years ago and ongoing bleeding today, would warrant colonoscopy to further investigate source.   Recommendations:  - colonoscopy tomorrow afternoon - miralax this evening and again  in am - npo after mn except for colon prep - must finish prep by 10 am.   Thank you for the consult. Please call with questions or concerns.  REIN, Grace Blight, MD

## 2015-01-27 NOTE — Interval H&P Note (Signed)
History and Physical Interval Note:  01/27/2015 2:01 PM  Miranda Olsen  has presented today for surgery, with the diagnosis of gi bleed  The various methods of treatment have been discussed with the patient and family. After consideration of risks, benefits and other options for treatment, the patient has consented to  Procedure(s): COLONOSCOPY WITH PROPOFOL (N/A) as a surgical intervention .  The patient's history has been reviewed, patient examined, no change in status, stable for surgery.  I have reviewed the patient's chart and labs.  Questions were answered to the patient's satisfaction.     Tiyanna Larcom GORDON

## 2015-01-28 LAB — CBC
HCT: 24.1 % — ABNORMAL LOW (ref 35.0–47.0)
Hemoglobin: 8.1 g/dL — ABNORMAL LOW (ref 12.0–16.0)
MCH: 29.2 pg (ref 26.0–34.0)
MCHC: 33.6 g/dL (ref 32.0–36.0)
MCV: 86.8 fL (ref 80.0–100.0)
PLATELETS: 162 10*3/uL (ref 150–440)
RBC: 2.78 MIL/uL — ABNORMAL LOW (ref 3.80–5.20)
RDW: 14.4 % (ref 11.5–14.5)
WBC: 6.9 10*3/uL (ref 3.6–11.0)

## 2015-01-28 LAB — GLUCOSE, CAPILLARY
GLUCOSE-CAPILLARY: 85 mg/dL (ref 65–99)
GLUCOSE-CAPILLARY: 123 mg/dL — AB (ref 65–99)
Glucose-Capillary: 134 mg/dL — ABNORMAL HIGH (ref 65–99)
Glucose-Capillary: 115 mg/dL — ABNORMAL HIGH (ref 65–99)

## 2015-01-28 LAB — HEMOGLOBIN: HEMOGLOBIN: 6.6 g/dL — AB (ref 12.0–16.0)

## 2015-01-28 LAB — PREPARE RBC (CROSSMATCH)

## 2015-01-28 MED ORDER — SODIUM CHLORIDE 0.9 % IV SOLN
Freq: Once | INTRAVENOUS | Status: AC
  Administered 2015-01-28: 15:00:00 via INTRAVENOUS

## 2015-01-28 MED ORDER — PANTOPRAZOLE SODIUM 40 MG PO TBEC
40.0000 mg | DELAYED_RELEASE_TABLET | Freq: Two times a day (BID) | ORAL | Status: DC
  Administered 2015-01-29: 40 mg via ORAL
  Filled 2015-01-28: qty 1

## 2015-01-28 NOTE — Progress Notes (Signed)
[facility] at [place] NAME: Miranda Olsen    MR#:  [id number]  DATE OF BIRTH:  December 05, 1940  SUBJECTIVE:  CHIEF COMPLAINT:   Chief Complaint  Patient presents with  . Rectal Bleeding  . Diarrhea   - Patient admitted for rectal bleed. GI bleeding scan is negative.  -Colonoscopy with the large diverticulosis. Likely diverticular bleed. Received 2 units packed RBC on admission. -No active bleeding now. Hemoglobin still dropped to 6.6 -For 1 more unit of packed RBC today  REVIEW OF SYSTEMS:  Review of Systems  Constitutional: Negative for fever and chills.  HENT: Negative for ear discharge, ear pain and tinnitus.   Eyes: Negative for blurred vision.  Respiratory: Negative for cough, shortness of breath and wheezing.   Cardiovascular: Negative for chest pain, palpitations and leg swelling.  Gastrointestinal: Negative for nausea, vomiting, abdominal pain, diarrhea, constipation and blood in stool.  Genitourinary: Negative for dysuria.  Skin: Negative for rash.  Neurological: Negative for dizziness, tremors, focal weakness, seizures and headaches.  Psychiatric/Behavioral: Negative for depression.    DRUG ALLERGIES:  No Known Allergies  VITALS:  Blood pressure 137/76, pulse 90, temperature 98.4 F (36.9 C), temperature source Oral, resp. rate 17, height 5\' 2"  (1.575 m), weight 86.183 kg (190 lb), SpO2 100 %.  PHYSICAL EXAMINATION:  Physical Exam  GENERAL:  75 y.o.-year-old patient lying in the bed with no acute distress.  EYES: Pupils equal, round, reactive to light and accommodation. No scleral icterus. Extraocular muscles intact.  HEENT: Head atraumatic, normocephalic. Oropharynx and nasopharynx clear.  NECK:  Supple, no jugular venous distention. No thyroid enlargement, no tenderness.  LUNGS: Normal breath sounds bilaterally, no wheezing, rales,rhonchi or crepitation. No use of accessory muscles of respiration.   CARDIOVASCULAR: S1, S2 normal. No  rubs, or gallops. 3/6 systolic murmur is present ABDOMEN: Soft, nontender, nondistended. Bowel sounds present. No organomegaly or mass.  EXTREMITIES: No pedal edema, cyanosis, or clubbing.  NEUROLOGIC: Cranial nerves II through XII are intact. Muscle strength 5/5 in all extremities. Sensation intact. Gait not checked.  PSYCHIATRIC: The patient is alert and oriented x 3.  SKIN: No obvious rash, lesion, or ulcer.    LABORATORY PANEL:   CBC  Recent Labs Lab 01/26/15 0339  01/28/15 0636  WBC 7.4  --   --   HGB 8.9*  < > 6.6*  HCT 26.4*  --   --   PLT 180  --   --   < > = values in this interval not displayed. ------------------------------------------------------------------------------------------------------------------  Chemistries   Recent Labs Lab 01/25/15 1354  01/27/15 0220  NA 141  < > 138  K 4.7  < > 4.1  CL 107  < > 111  CO2 27  < > 22  GLUCOSE 115*  < > 132*  BUN 30*  < > 20  CREATININE 1.38*  < > 1.00  CALCIUM 9.3  < > 8.0*  MG  --   --  1.7  AST 12*  --   --   ALT 11*  --   --   ALKPHOS 49  --   --   BILITOT 0.5  --   --   < > = values in this interval not displayed. ------------------------------------------------------------------------------------------------------------------  Cardiac Enzymes No results for input(s): TROPONINI in the last 168 hours. ------------------------------------------------------------------------------------------------------------------  RADIOLOGY:  No results found.  EKG:   Orders placed or performed during the hospital encounter of 01/25/15  . EKG 12-Lead  .  EKG 12-Lead    ASSESSMENT AND PLAN:   75 year old female with past medical history significant for hypertension, hyperlipidemia, diabetes mellitus presents to the hospital secondary to rectal bleed.  #1 GI bleed-likely lower GI bleed - Diverticular bleed. - Appreciate GI consult. -Status post colonoscopy and large  diverticula seen. -No active bleeding at this time. Hemoglobin stable now. Recheck hemoglobin. -1 more unit of packed RBC to be transfused today. Hemoglobin again later today and tomorrow morning. -If stable then will discharge home tomorrow. -Change Protonix to by mouth.  #2 acute on chronic anemia-hemoglobin at baseline seems to be around 10 -Now at 6.6, anemia secondary to GI bleed. -Received 2 units already, transfuse with 1 more unit today. Recheck hemoglobin later today and then tomorrow morning.  #3 hypertension-on Norvasc and Lotensin  #4 hyperlipidemia-on simvastatin.   #5 DVT prophylaxis-Ted's and SCDs. No heparin products due to GI bleed.   All the records are reviewed and case discussed with Care Management/Social Workerr. Management plans discussed with the patient, family and they are in agreement.  CODE STATUS: Full code  TOTAL TIME TAKING CARE OF THIS PATIENT: 33 minutes.   POSSIBLE D/C IN 1-2 DAYS, DEPENDING ON CLINICAL CONDITION.   Gladstone Lighter M.D on 01/28/2015 at 10:12 AM  Between 7am to 6pm - Pager - [phone]  After 6pm go to [email or web address] - password [email or web address]  [facility]  Office  [phone]  CC: Primary care physician; Margarita Rana, MD

## 2015-01-29 ENCOUNTER — Encounter: Payer: Self-pay | Admitting: Gastroenterology

## 2015-01-29 LAB — CBC
HCT: 24.8 % — ABNORMAL LOW (ref 35.0–47.0)
Hemoglobin: 8.4 g/dL — ABNORMAL LOW (ref 12.0–16.0)
MCH: 29.8 pg (ref 26.0–34.0)
MCHC: 34 g/dL (ref 32.0–36.0)
MCV: 87.6 fL (ref 80.0–100.0)
PLATELETS: 165 10*3/uL (ref 150–440)
RBC: 2.83 MIL/uL — ABNORMAL LOW (ref 3.80–5.20)
RDW: 14.8 % — AB (ref 11.5–14.5)
WBC: 6.4 10*3/uL (ref 3.6–11.0)

## 2015-01-29 LAB — TYPE AND SCREEN
ABO/RH(D): O POS
Antibody Screen: NEGATIVE
UNIT DIVISION: 0
Unit division: 0
Unit division: 0

## 2015-01-29 LAB — GLUCOSE, CAPILLARY
Glucose-Capillary: 104 mg/dL — ABNORMAL HIGH (ref 65–99)
Glucose-Capillary: 131 mg/dL — ABNORMAL HIGH (ref 65–99)

## 2015-01-29 NOTE — Care Management (Signed)
Spoke with patient who is alert and oriented from home with family, states drives self and pick grand kids up from school every day. States that she gets around well at home and that she doesn't use and DME .    No CM needs identified.

## 2015-01-29 NOTE — Discharge Summary (Signed)
[facility] at [place] NAME: Miranda Olsen    MR#:  [id number]  DATE OF BIRTH:  12-24-1940  DATE OF ADMISSION:  01/25/2015 ADMITTING PHYSICIAN: Loletha Grayer, MD  DATE OF DISCHARGE: 01/29/2015  1:05 PM  PRIMARY CARE PHYSICIAN: Margarita Rana, MD    ADMISSION DIAGNOSIS:  Gastrointestinal bleeding [K92.2] Acute GI bleeding [K92.2]  DISCHARGE DIAGNOSIS:  Active Problems:   Acute blood loss anemia   SECONDARY DIAGNOSIS:   Past Medical History  Diagnosis Date  . Hypertension   . Diabetes mellitus without complication ([place])   . Hypercholesterolemia     HOSPITAL COURSE:   75 year old female with past medical history significant for hypertension, hyperlipidemia, diabetes mellitus presents to the hospital secondary to rectal bleed.  #1 GI bleed-lower GI bleed - Diverticular bleed. - Appreciate GI consult. -Status post colonoscopy and large diverticula seen. -No active bleeding at this time. Hemoglobin stable - received a total of 3 units packed RBC transfusion this admission -Hemoglobin is stable around 8. -Change Protonix to by mouth. -GI follow up as outpatient recommended  #2 acute on chronic anemia-hemoglobin at baseline seems to be around 10 -Now at around 8 after transfusions, anemia secondary to GI bleed. -Stable for now.  #3 hypertension-on Norvasc and Lotensin  #4 hyperlipidemia-on simvastatin.  Discharge home today  DISCHARGE CONDITIONS:   Stable  CONSULTS OBTAINED:  Treatment Team:  Loletha Grayer, MD Josefine Class, MD  DRUG ALLERGIES:  No Known Allergies  DISCHARGE MEDICATIONS:   Discharge Medication List as of 01/29/2015 11:06 AM    CONTINUE these medications which have NOT CHANGED   Details  amLODipine (NORVASC) 10 MG tablet Take 10 mg by mouth daily. , Starting 04/03/2014, Until Discontinued, Historical Med    benazepril (LOTENSIN) 20 MG tablet Take 40 mg by mouth every morning. ,  Starting 06/11/2013, Until Discontinued, Historical Med    cholecalciferol (VITAMIN D) 1000 UNITS tablet Take 1,000 Units by mouth daily. , Starting 03/25/2012, Until Discontinued, Historical Med    cyanocobalamin 1000 MCG tablet Take 100 mcg by mouth daily. , Until Discontinued, Historical Med    ferrous sulfate 325 (65 FE) MG tablet TAKE 1 TABLET BY MOUTH EVERY DAY, Normal    hydrochlorothiazide (HYDRODIURIL) 25 MG tablet Take 25 mg by mouth daily. , Starting 04/07/2014, Until Discontinued, Historical Med    metFORMIN (GLUCOPHAGE) 1000 MG tablet Take 1,000 mg by mouth 2 (two) times daily with a meal. , Starting 05/18/2014, Until Discontinued, Historical Med    omeprazole (PRILOSEC) 40 MG capsule Take 40 mg by mouth daily. , Starting 04/09/2014, Until Discontinued, Historical Med    simvastatin (ZOCOR) 20 MG tablet Take 1 tablet (20 mg total) by mouth at bedtime., Starting 10/31/2014, Until Discontinued, Normal      STOP taking these medications     aspirin EC 81 MG tablet          DISCHARGE INSTRUCTIONS:   1. PCP follow-up in 2 weeks 2. GI follow up in 4 weeks  If you experience worsening of your admission symptoms, develop shortness of breath, life threatening emergency, suicidal or homicidal thoughts you must seek medical attention immediately by calling 911 or calling your MD immediately  if symptoms less severe.  You Must read complete instructions/literature along with all the possible adverse reactions/side effects for all the Medicines you take and that have been prescribed to you. Take any new Medicines after you have completely understood and accept all the possible adverse  reactions/side effects.   Please note  You were cared for by a hospitalist during your hospital stay. If you have any questions about your discharge medications or the care you received while you were in the hospital after you are discharged, you can call the unit and asked to speak with the hospitalist on  call if the hospitalist that took care of you is not available. Once you are discharged, your primary care physician will handle any further medical issues. Please note that NO REFILLS for any discharge medications will be authorized once you are discharged, as it is imperative that you return to your primary care physician (or establish a relationship with a primary care physician if you do not have one) for your aftercare needs so that they can reassess your need for medications and monitor your lab values.    Today   CHIEF COMPLAINT:   Chief Complaint  Patient presents with  . Rectal Bleeding  . Diarrhea     VITAL SIGNS:  Blood pressure 154/82, pulse 90, temperature 98.9 F (37.2 C), temperature source Oral, resp. rate 16, height 5\' 2"  (1.575 m), weight 86.183 kg (190 lb), SpO2 99 %.  I/O:   Intake/Output Summary (Last 24 hours) at 01/29/15 1407 Last data filed at 01/29/15 [id number]  Gross per 24 hour  Intake 2216.54 ml  Output   2700 ml  Net -483.46 ml    PHYSICAL EXAMINATION:   Physical Exam  GENERAL: 75 y.o.-year-old patient lying in the bed with no acute distress.  EYES: Pupils equal, round, reactive to light and accommodation. No scleral icterus. Extraocular muscles intact.  HEENT: Head atraumatic, normocephalic. Oropharynx and nasopharynx clear.  NECK: Supple, no jugular venous distention. No thyroid enlargement, no tenderness.  LUNGS: Normal breath sounds bilaterally, no wheezing, rales,rhonchi or crepitation. No use of accessory muscles of respiration.  CARDIOVASCULAR: S1, S2 normal. No rubs, or gallops. 3/6 systolic murmur is present ABDOMEN: Soft, nontender, nondistended. Bowel sounds present. No organomegaly or mass.  EXTREMITIES: No pedal edema, cyanosis, or clubbing.  NEUROLOGIC: Cranial nerves II through XII are intact. Muscle strength 5/5 in all extremities. Sensation intact. Gait not checked.  PSYCHIATRIC: The patient is alert and oriented x 3.   SKIN: No obvious rash, lesion, or ulcer.    DATA REVIEW:   CBC  Recent Labs Lab 01/29/15 0521  WBC 6.4  HGB 8.4*  HCT 24.8*  PLT 165    Chemistries   Recent Labs Lab 01/25/15 1354  01/27/15 0220  NA 141  < > 138  K 4.7  < > 4.1  CL 107  < > 111  CO2 27  < > 22  GLUCOSE 115*  < > 132*  BUN 30*  < > 20  CREATININE 1.38*  < > 1.00  CALCIUM 9.3  < > 8.0*  MG  --   --  1.7  AST 12*  --   --   ALT 11*  --   --   ALKPHOS 49  --   --   BILITOT 0.5  --   --   < > = values in this interval not displayed.  Cardiac Enzymes No results for input(s): TROPONINI in the last 168 hours.  Microbiology Results  Results for orders placed or performed during the hospital encounter of 01/25/15  MRSA PCR Screening     Status: None   Collection Time: 01/25/15 11:45 PM  Result Value Ref Range Status   MRSA by PCR NEGATIVE NEGATIVE Final  Comment:        The GeneXpert MRSA Assay (FDA approved for NASAL specimens only), is one component of a comprehensive MRSA colonization surveillance program. It is not intended to diagnose MRSA infection nor to guide or monitor treatment for MRSA infections.   C difficile quick scan w PCR reflex     Status: None   Collection Time: 01/26/15  4:33 PM  Result Value Ref Range Status   C Diff antigen NEGATIVE NEGATIVE Final   C Diff toxin NEGATIVE NEGATIVE Final   C Diff interpretation Negative for C. difficile  Final    RADIOLOGY:  No results found.  EKG:   Orders placed or performed during the hospital encounter of 01/25/15  . EKG 12-Lead  . EKG 12-Lead      Management plans discussed with the patient, family and they are in agreement.  CODE STATUS:     Code Status Orders        Start     Ordered   01/25/15 1813  Full code   Continuous     01/25/15 1813      TOTAL TIME TAKING CARE OF THIS PATIENT: 37 minutes.    Gladstone Lighter M.D on 01/29/2015 at 2:07 PM  Between 7am to 6pm - Pager - [phone]  After 6pm  go to [email or web address] - password [email or web address]  [place] [facility]  Office  [phone]  CC: Primary care physician; Margarita Rana, MD

## 2015-01-29 NOTE — Care Management Important Message (Signed)
Important Message  Patient Details  Name: Miranda Olsen MRN: [id number] Date of Birth: 1940/02/24   Medicare Important Message Given:  Yes    Juliann Pulse A Amanada Philbrick 01/29/2015, 9:06 AM

## 2015-01-29 NOTE — Progress Notes (Signed)
Refused to have bed alarm back on. Educated on safety precaution.

## 2015-02-07 ENCOUNTER — Ambulatory Visit (INDEPENDENT_AMBULATORY_CARE_PROVIDER_SITE_OTHER): Payer: Commercial Managed Care - HMO | Admitting: Family Medicine

## 2015-02-07 ENCOUNTER — Encounter: Payer: Self-pay | Admitting: Family Medicine

## 2015-02-07 VITALS — BP 134/82 | HR 84 | Temp 98.2°F | Resp 16 | Ht 62.0 in | Wt 189.0 lb

## 2015-02-07 DIAGNOSIS — D62 Acute posthemorrhagic anemia: Secondary | ICD-10-CM | POA: Diagnosis not present

## 2015-02-07 DIAGNOSIS — E7800 Pure hypercholesterolemia, unspecified: Secondary | ICD-10-CM

## 2015-02-07 DIAGNOSIS — N182 Chronic kidney disease, stage 2 (mild): Secondary | ICD-10-CM

## 2015-02-07 DIAGNOSIS — E559 Vitamin D deficiency, unspecified: Secondary | ICD-10-CM

## 2015-02-07 DIAGNOSIS — Z0000 Encounter for general adult medical examination without abnormal findings: Secondary | ICD-10-CM | POA: Diagnosis not present

## 2015-02-07 DIAGNOSIS — I129 Hypertensive chronic kidney disease with stage 1 through stage 4 chronic kidney disease, or unspecified chronic kidney disease: Secondary | ICD-10-CM | POA: Diagnosis not present

## 2015-02-07 DIAGNOSIS — E119 Type 2 diabetes mellitus without complications: Secondary | ICD-10-CM

## 2015-02-07 NOTE — Patient Instructions (Signed)
Start over the counter senna-s 1 tablet two times daily for constipation.

## 2015-02-07 NOTE — Progress Notes (Signed)
Patient ID: Miranda Olsen, female   DOB: 1940/05/03, 75 y.o.   MRN: [id number]       Patient: Miranda Olsen, Female    DOB: 1940-02-12, 75 y.o.   MRN: [id number] Visit Date: 02/07/2015  Today's Provider: Margarita Rana, MD   Chief Complaint  Patient presents with  . Medicare Wellness   Subjective:    Annual wellness visit Miranda Olsen is a 75 y.o. female. She feels well. She reports exercising active with daily activities. She reports she is sleeping well. 10/03/13 CPE 05/02/14 Mammo-BI-RADS 1 01/27/15 Colon-diverticulosis, recheck in 5 yrs 11/20/13 BMD-normal, recheck in 3-5 yrs  Lab Results  Component Value Date   WBC 6.4 01/29/2015   HGB 8.4* 01/29/2015   HCT 24.8* 01/29/2015   PLT 165 01/29/2015   GLUCOSE 132* 01/27/2015   CHOL 232* 07/31/2014   TRIG 94 07/31/2014   HDL 76 07/31/2014   LDLCALC 137* 07/31/2014   ALT 11* 01/25/2015   AST 12* 01/25/2015   NA 138 01/27/2015   K 4.1 01/27/2015   CL 111 01/27/2015   CREATININE 1.00 01/27/2015   BUN 20 01/27/2015   CO2 22 01/27/2015   TSH 1.130 07/31/2014   HGBA1C 6.4 07/31/2014   -----------------------------------------------------------   Follow up Hospitalization  Patient was admitted to [facility] on 01/25/2015 and discharged on 01/29/2015. She was treated for acute GI bleed. Treatment for this included colonoscopy. She reports excellent compliance with treatment. She reports this condition is Improved.  ------------------------------------------------------------------------------------    Diabetes Mellitus Type II, Follow-up:   Lab Results  Component Value Date   HGBA1C 6.4 07/31/2014   HGBA1C 6.7* 04/03/2014    Last seen for diabetes 6 months ago.  Management since then includes check labs. She reports excellent compliance with treatment. She is not having side effects.  Current symptoms include none and have been stable. Home blood sugar records: stable  Episodes of hypoglycemia?  no   Current Insulin Regimen: none Most Recent Eye Exam:  Weight trend: stable Prior visit with dietician: no Current diet: in general, a "healthy" diet   Current exercise: none   Wt Readings from Last 3 Encounters:  02/07/15 189 lb (85.73 kg)  01/25/15 190 lb (86.183 kg)  12/24/14 190 lb (86.183 kg)    ------------------------------------------------------------------------    Review of Systems  Constitutional: Positive for fatigue.  HENT: Negative.   Eyes: Negative.   Respiratory: Negative.   Cardiovascular: Negative.   Gastrointestinal: Positive for blood in stool.  Endocrine: Negative.   Genitourinary: Negative.   Musculoskeletal: Positive for back pain and arthralgias.  Skin: Negative.   Allergic/Immunologic: Negative.   Neurological: Negative.   Hematological: Negative.   Psychiatric/Behavioral: Negative.     Social History   Social History  . Marital Status: Widowed    Spouse Name: N/A  . Number of Children: 2  . Years of Education: H/S   Occupational History  . Retired    Social History Main Topics  . Smoking status: Never Smoker   . Smokeless tobacco: Never Used  . Alcohol Use: 0.6 oz/week    1 Glasses of wine per week     Comment: occasional  . Drug Use: No  . Sexual Activity: Not on file   Other Topics Concern  . Not on file   Social History Narrative    Past Medical History  Diagnosis Date  . Hypertension   . Diabetes mellitus without complication ([place])   . Hypercholesterolemia      Patient  Active Problem List   Diagnosis Date Noted  . Acute blood loss anemia 01/25/2015  . Chronic kidney disease (CKD), stage III (moderate) 07/31/2014  . Dysfunction of eustachian tube 05/30/2014  . Acid reflux 05/30/2014  . Burning or prickling sensation 05/30/2014  . Deficiency of vitamin B 07/05/2008  . Anemia, deficiency 06/26/2008  . Avitaminosis D 06/26/2008  . Benign hypertension with CKD (chronic kidney disease), stage II 06/15/2008  .  Hypercholesteremia 06/12/2008  . Diabetes ([place]) 06/06/2008  . Colon, diverticulosis 06/06/2008  . Apnea, sleep 01/26/2007  . History of colon polyps 08/09/2006    Past Surgical History  Procedure Laterality Date  . Replacement total knee Left 10/08/2009    Dr. Marry Guan  . Eye surgery Left 01/21/2009     cataract surgery   . Eye surgery Right 04/08/2009    cataract surgery  . Colonoscopy with propofol N/A 01/27/2015    Procedure: COLONOSCOPY WITH PROPOFOL;  Surgeon: Josefine Class, MD;  Location: [facility] ENDOSCOPY;  Service: Endoscopy;  Laterality: N/A;    Her family history includes Cancer in her sister; Congestive Heart Failure in her sister and sister; Leukemia in her father; Lung cancer in her brother; Parkinsonism in her mother.    Previous Medications   AMLODIPINE (NORVASC) 10 MG TABLET    Take 10 mg by mouth daily.    BENAZEPRIL (LOTENSIN) 20 MG TABLET    Take 40 mg by mouth every morning.    CHOLECALCIFEROL (VITAMIN D) 1000 UNITS TABLET    Take 1,000 Units by mouth daily.    CYANOCOBALAMIN 1000 MCG TABLET    Take 1,000 mcg by mouth daily.    FERROUS SULFATE 325 (65 FE) MG TABLET    TAKE 1 TABLET BY MOUTH EVERY DAY   HYDROCHLOROTHIAZIDE (HYDRODIURIL) 25 MG TABLET    Take 25 mg by mouth daily.    METFORMIN (GLUCOPHAGE) 1000 MG TABLET    Take 1,000 mg by mouth 2 (two) times daily with a meal.    OMEPRAZOLE (PRILOSEC) 40 MG CAPSULE    Take 40 mg by mouth daily.    SIMVASTATIN (ZOCOR) 20 MG TABLET    Take 1 tablet (20 mg total) by mouth at bedtime.    Patient Care Team: Margarita Rana, MD as PCP - General (Family Medicine)     Objective:   Vitals: BP 134/82 mmHg  Pulse 84  Temp(Src) 98.2 F (36.8 C) (Oral)  Resp 16  Ht 5\' 2"  (1.575 m)  Wt 189 lb (85.73 kg)  BMI 34.56 kg/m2  SpO2 97%  Physical Exam  Constitutional: She is oriented to person, place, and time. She appears well-developed and well-nourished.  HENT:  Head: Normocephalic and atraumatic.  Right Ear: Tympanic  membrane, external ear and ear canal normal.  Left Ear: Tympanic membrane, external ear and ear canal normal.  Nose: Nose normal.  Mouth/Throat: Uvula is midline, oropharynx is clear and moist and mucous membranes are normal.  Eyes: Conjunctivae, EOM and lids are normal. Pupils are equal, round, and reactive to light.  Neck: Trachea normal and normal range of motion. Neck supple. Carotid bruit is not present. No thyroid mass and no thyromegaly present.  Cardiovascular: Normal rate, regular rhythm and normal heart sounds.   Pulmonary/Chest: Effort normal and breath sounds normal.  Abdominal: Soft. Normal appearance and bowel sounds are normal. There is no hepatosplenomegaly. There is no tenderness.  Genitourinary: No breast swelling, tenderness or discharge.  Musculoskeletal: Normal range of motion.  Lymphadenopathy:    She has  no cervical adenopathy.    She has no axillary adenopathy.  Neurological: She is alert and oriented to person, place, and time. She has normal strength. No cranial nerve deficit.  Skin: Skin is warm, dry and intact.  Psychiatric: She has a normal mood and affect. Her speech is normal and behavior is normal. Judgment and thought content normal. Cognition and memory are normal.    Activities of Daily Living In your present state of health, do you have any difficulty performing the following activities: 02/07/2015 01/26/2015  Hearing? Tempie Donning  Vision? N N  Difficulty concentrating or making decisions? N N  Walking or climbing stairs? N Y  Dressing or bathing? N N  Doing errands, shopping? N N    Fall Risk Assessment Fall Risk  02/07/2015 07/31/2014  Falls in the past year? No No     Depression Screen PHQ 2/9 Scores 02/07/2015 07/31/2014  PHQ - 2 Score 0 0    Cognitive Testing - 6-CIT  Correct? Score   What year is it? yes 0 0 or 4  What month is it? yes 0 0 or 3  Memorize:    Pia Mau,  43,  [address],  [place],      What time is it? (within 1 hour) yes 0 0 or 3   Count backwards from 20 yes 0 0, 2, or 4  Name the months of the year yes 0 0, 2, or 4  Repeat name & address above yes 3 0, 2, 4, 6, 8, or 10       TOTAL SCORE  3/28   Interpretation:  Normal  Normal (0-7) Abnormal (8-28)       Assessment & Plan:     Annual Wellness Visit  Reviewed patient's Family Medical History Reviewed and updated list of patient's medical providers Assessment of cognitive impairment was done Assessed patient's functional ability Established a written schedule for health screening [place] Completed and Reviewed  Exercise Activities and Dietary recommendations Goals    None      Immunization History  Administered Date(s) Administered  . Pneumococcal Conjugate-13 10/03/2013  . Pneumococcal Polysaccharide-23 03/25/2011    ------------------------------------------------------------------------------------------------------------  1. Medicare annual wellness visit, subsequent Stable. Patient advised to continue eating healthy and exercise daily.  2. Benign hypertension with CKD (chronic kidney disease), stage II F/U pending lab report. - Comprehensive metabolic panel - CBC with Differential/Platelet  3. Diabetes mellitus without complication ([place]) - Hemoglobin A1c Diabetic Foot Exam - Simple   Simple Foot Form  Diabetic Foot exam was performed with the following findings:  Yes 02/07/2015 11:01 AM  Visual Inspection  No deformities, no ulcerations, no other skin breakdown bilaterally:  Yes  Sensation Testing  Intact to touch and monofilament testing bilaterally:  Yes  Pulse Check  Posterior Tibialis and Dorsalis pulse intact bilaterally:  Yes  Comments     4. Hypercholesteremia - Lipid Panel With LDL/HDL Ratio  5. Acute blood loss anemia F/U pending lab report.  6. Avitaminosis D - VITAMIN D 25 Hydroxy (Vit-D Deficiency, Fractures)  Patient was seen and examined by Jerrell Belfast, MD, and note scribed by  Lynford Humphrey, [place].   I have reviewed the document for accuracy and completeness and I agree with above. Jerrell Belfast, MD   Margarita Rana, MD

## 2015-02-10 ENCOUNTER — Inpatient Hospital Stay: Payer: Commercial Managed Care - HMO | Admitting: Family Medicine

## 2015-02-20 DIAGNOSIS — E119 Type 2 diabetes mellitus without complications: Secondary | ICD-10-CM | POA: Diagnosis not present

## 2015-02-20 DIAGNOSIS — E7800 Pure hypercholesterolemia, unspecified: Secondary | ICD-10-CM | POA: Diagnosis not present

## 2015-02-20 DIAGNOSIS — E559 Vitamin D deficiency, unspecified: Secondary | ICD-10-CM | POA: Diagnosis not present

## 2015-02-20 DIAGNOSIS — I129 Hypertensive chronic kidney disease with stage 1 through stage 4 chronic kidney disease, or unspecified chronic kidney disease: Secondary | ICD-10-CM | POA: Diagnosis not present

## 2015-02-20 DIAGNOSIS — N182 Chronic kidney disease, stage 2 (mild): Secondary | ICD-10-CM | POA: Diagnosis not present

## 2015-02-21 LAB — COMPREHENSIVE METABOLIC PANEL
A/G RATIO: 1.6 (ref 1.1–2.5)
ALT: 6 IU/L (ref 0–32)
AST: 8 IU/L (ref 0–40)
Albumin: 3.9 g/dL (ref 3.5–4.8)
Alkaline Phosphatase: 45 IU/L (ref 39–117)
BUN/Creatinine Ratio: 18 (ref 11–26)
BUN: 22 mg/dL (ref 8–27)
Bilirubin Total: 0.2 mg/dL (ref 0.0–1.2)
CALCIUM: 9.3 mg/dL (ref 8.7–10.3)
CO2: 24 mmol/L (ref 18–29)
CREATININE: 1.21 mg/dL — AB (ref 0.57–1.00)
Chloride: 101 mmol/L (ref 96–106)
GFR, EST AFRICAN AMERICAN: 51 mL/min/{1.73_m2} — AB (ref >59)
GFR, EST NON AFRICAN AMERICAN: 44 mL/min/{1.73_m2} — AB (ref >59)
Globulin, Total: 2.5 g/dL (ref 1.5–4.5)
Glucose: 93 mg/dL (ref 65–99)
POTASSIUM: 4.6 mmol/L (ref 3.5–5.2)
Sodium: 140 mmol/L (ref 134–144)
TOTAL PROTEIN: 6.4 g/dL (ref 6.0–8.5)

## 2015-02-21 LAB — LIPID PANEL WITH LDL/HDL RATIO
CHOLESTEROL TOTAL: 259 mg/dL — AB (ref 100–199)
HDL: 72 mg/dL (ref >39)
LDL CALC: 172 mg/dL — AB (ref 0–99)
LDL/HDL RATIO: 2.4 ratio (ref 0.0–3.2)
TRIGLYCERIDES: 77 mg/dL (ref 0–149)
VLDL CHOLESTEROL CAL: 15 mg/dL (ref 5–40)

## 2015-02-21 LAB — CBC WITH DIFFERENTIAL/PLATELET
BASOS: 0 %
Basophils Absolute: 0 10*3/uL (ref 0.0–0.2)
EOS (ABSOLUTE): 0.2 10*3/uL (ref 0.0–0.4)
EOS: 4 %
HEMATOCRIT: 29.8 % — AB (ref 34.0–46.6)
HEMOGLOBIN: 9.8 g/dL — AB (ref 11.1–15.9)
IMMATURE GRANS (ABS): 0 10*3/uL (ref 0.0–0.1)
IMMATURE GRANULOCYTES: 0 %
LYMPHS: 29 %
Lymphocytes Absolute: 1.4 10*3/uL (ref 0.7–3.1)
MCH: 28.9 pg (ref 26.6–33.0)
MCHC: 32.9 g/dL (ref 31.5–35.7)
MCV: 88 fL (ref 79–97)
MONOCYTES: 8 %
Monocytes Absolute: 0.4 10*3/uL (ref 0.1–0.9)
NEUTROS ABS: 2.9 10*3/uL (ref 1.4–7.0)
NEUTROS PCT: 59 %
Platelets: 290 10*3/uL (ref 150–379)
RBC: 3.39 x10E6/uL — ABNORMAL LOW (ref 3.77–5.28)
RDW: 13.9 % (ref 12.3–15.4)
WBC: 4.9 10*3/uL (ref 3.4–10.8)

## 2015-02-21 LAB — VITAMIN D 25 HYDROXY (VIT D DEFICIENCY, FRACTURES): Vit D, 25-Hydroxy: 28.3 ng/mL — ABNORMAL LOW (ref 30.0–100.0)

## 2015-02-21 LAB — HEMOGLOBIN A1C
Est. average glucose Bld gHb Est-mCnc: 123 mg/dL
Hgb A1c MFr Bld: 5.9 % — ABNORMAL HIGH (ref 4.8–5.6)

## 2015-02-24 ENCOUNTER — Telehealth: Payer: Self-pay

## 2015-02-24 DIAGNOSIS — E7800 Pure hypercholesterolemia, unspecified: Secondary | ICD-10-CM

## 2015-02-24 MED ORDER — PRAVASTATIN SODIUM 10 MG PO TABS: 10.0000 mg | ORAL_TABLET | Freq: Every day | ORAL | Status: DC

## 2015-02-24 NOTE — Telephone Encounter (Signed)
Pt agrees to chang medication. Sent Pravastatin 10 mg into pharmacy per verbal order from Dr. Venia Minks. Renaldo Fiddler, CMA

## 2015-02-24 NOTE — Telephone Encounter (Signed)
Opened in error. Renaldo Fiddler, CMA

## 2015-02-24 NOTE — Telephone Encounter (Signed)
-----   Message from Margarita Rana, MD sent at 02/24/2015  2:29 PM EST ----- Can try Pravachol if she has not tried this.  Sometimes better tolerated. Thanks.

## 2015-03-27 ENCOUNTER — Other Ambulatory Visit: Payer: Self-pay | Admitting: Family Medicine

## 2015-03-27 DIAGNOSIS — N182 Chronic kidney disease, stage 2 (mild): Principal | ICD-10-CM

## 2015-03-27 DIAGNOSIS — I129 Hypertensive chronic kidney disease with stage 1 through stage 4 chronic kidney disease, or unspecified chronic kidney disease: Secondary | ICD-10-CM

## 2015-05-02 ENCOUNTER — Other Ambulatory Visit: Payer: Self-pay

## 2015-05-02 DIAGNOSIS — I129 Hypertensive chronic kidney disease with stage 1 through stage 4 chronic kidney disease, or unspecified chronic kidney disease: Secondary | ICD-10-CM

## 2015-05-02 DIAGNOSIS — K219 Gastro-esophageal reflux disease without esophagitis: Secondary | ICD-10-CM

## 2015-05-02 DIAGNOSIS — E7800 Pure hypercholesterolemia, unspecified: Secondary | ICD-10-CM

## 2015-05-02 DIAGNOSIS — E119 Type 2 diabetes mellitus without complications: Secondary | ICD-10-CM

## 2015-05-02 DIAGNOSIS — N182 Chronic kidney disease, stage 2 (mild): Secondary | ICD-10-CM

## 2015-05-02 MED ORDER — PRAVASTATIN SODIUM 10 MG PO TABS: 10.0000 mg | ORAL_TABLET | Freq: Every day | ORAL | Status: DC

## 2015-05-02 MED ORDER — METFORMIN HCL 1000 MG PO TABS: 1000.0000 mg | ORAL_TABLET | Freq: Two times a day (BID) | ORAL | Status: DC

## 2015-05-02 MED ORDER — HYDROCHLOROTHIAZIDE 25 MG PO TABS: 25.0000 mg | ORAL_TABLET | Freq: Every day | ORAL | Status: DC

## 2015-05-02 MED ORDER — BENAZEPRIL HCL 20 MG PO TABS: 40.0000 mg | ORAL_TABLET | ORAL | Status: DC

## 2015-05-02 MED ORDER — OMEPRAZOLE 40 MG PO CPDR: 40.0000 mg | DELAYED_RELEASE_CAPSULE | Freq: Every day | ORAL | Status: DC

## 2015-05-02 MED ORDER — AMLODIPINE BESYLATE 10 MG PO TABS: 10.0000 mg | ORAL_TABLET | Freq: Every day | ORAL | Status: DC

## 2015-05-20 ENCOUNTER — Other Ambulatory Visit: Payer: Self-pay

## 2015-05-20 DIAGNOSIS — N182 Chronic kidney disease, stage 2 (mild): Principal | ICD-10-CM

## 2015-05-20 DIAGNOSIS — K219 Gastro-esophageal reflux disease without esophagitis: Secondary | ICD-10-CM

## 2015-05-20 DIAGNOSIS — I129 Hypertensive chronic kidney disease with stage 1 through stage 4 chronic kidney disease, or unspecified chronic kidney disease: Secondary | ICD-10-CM

## 2015-05-20 DIAGNOSIS — E7800 Pure hypercholesterolemia, unspecified: Secondary | ICD-10-CM

## 2015-05-20 DIAGNOSIS — E119 Type 2 diabetes mellitus without complications: Secondary | ICD-10-CM

## 2015-05-20 MED ORDER — AMLODIPINE BESYLATE 10 MG PO TABS: 10.0000 mg | ORAL_TABLET | Freq: Every day | ORAL | Status: DC

## 2015-05-20 MED ORDER — METFORMIN HCL 1000 MG PO TABS: 1000.0000 mg | ORAL_TABLET | Freq: Two times a day (BID) | ORAL | Status: DC

## 2015-05-20 MED ORDER — HYDROCHLOROTHIAZIDE 25 MG PO TABS: 25.0000 mg | ORAL_TABLET | Freq: Every day | ORAL | Status: DC

## 2015-05-20 MED ORDER — OMEPRAZOLE 40 MG PO CPDR: 40.0000 mg | DELAYED_RELEASE_CAPSULE | Freq: Every day | ORAL | Status: DC

## 2015-05-20 MED ORDER — BENAZEPRIL HCL 20 MG PO TABS: 40.0000 mg | ORAL_TABLET | ORAL | Status: DC

## 2015-05-20 MED ORDER — PRAVASTATIN SODIUM 10 MG PO TABS: 10.0000 mg | ORAL_TABLET | Freq: Every day | ORAL | Status: DC

## 2015-06-06 ENCOUNTER — Ambulatory Visit: Payer: Commercial Managed Care - HMO | Admitting: Family Medicine

## 2015-06-09 ENCOUNTER — Ambulatory Visit: Payer: Commercial Managed Care - HMO | Admitting: Family Medicine

## 2015-06-28 ENCOUNTER — Other Ambulatory Visit: Payer: Self-pay | Admitting: Family Medicine

## 2015-06-28 DIAGNOSIS — N182 Chronic kidney disease, stage 2 (mild): Principal | ICD-10-CM

## 2015-06-28 DIAGNOSIS — I129 Hypertensive chronic kidney disease with stage 1 through stage 4 chronic kidney disease, or unspecified chronic kidney disease: Secondary | ICD-10-CM

## 2015-07-10 ENCOUNTER — Other Ambulatory Visit: Payer: Self-pay | Admitting: Family Medicine

## 2015-07-10 DIAGNOSIS — K219 Gastro-esophageal reflux disease without esophagitis: Secondary | ICD-10-CM

## 2015-07-11 ENCOUNTER — Telehealth: Payer: Self-pay

## 2015-07-11 ENCOUNTER — Encounter: Payer: Self-pay | Admitting: Family Medicine

## 2015-07-11 ENCOUNTER — Ambulatory Visit (INDEPENDENT_AMBULATORY_CARE_PROVIDER_SITE_OTHER): Payer: Commercial Managed Care - HMO | Admitting: Family Medicine

## 2015-07-11 ENCOUNTER — Ambulatory Visit
Admission: RE | Admit: 2015-07-11 | Discharge: 2015-07-11 | Disposition: A | Discharge status: Home / Self Care | Payer: Medicare HMO | Source: Ambulatory Visit | Attending: Family Medicine | Admitting: Family Medicine

## 2015-07-11 VITALS — BP 136/84 | HR 84 | Temp 98.4°F | Resp 16 | Wt 192.0 lb

## 2015-07-11 DIAGNOSIS — M545 Low back pain, unspecified: Secondary | ICD-10-CM | POA: Insufficient documentation

## 2015-07-11 DIAGNOSIS — M47816 Spondylosis without myelopathy or radiculopathy, lumbar region: Secondary | ICD-10-CM | POA: Diagnosis not present

## 2015-07-11 DIAGNOSIS — E7800 Pure hypercholesterolemia, unspecified: Secondary | ICD-10-CM

## 2015-07-11 DIAGNOSIS — E119 Type 2 diabetes mellitus without complications: Secondary | ICD-10-CM | POA: Diagnosis not present

## 2015-07-11 DIAGNOSIS — M5441 Lumbago with sciatica, right side: Secondary | ICD-10-CM | POA: Diagnosis not present

## 2015-07-11 DIAGNOSIS — N182 Chronic kidney disease, stage 2 (mild): Secondary | ICD-10-CM | POA: Diagnosis not present

## 2015-07-11 DIAGNOSIS — M4186 Other forms of scoliosis, lumbar region: Secondary | ICD-10-CM | POA: Diagnosis not present

## 2015-07-11 DIAGNOSIS — G9519 Other vascular myelopathies: Secondary | ICD-10-CM | POA: Diagnosis not present

## 2015-07-11 DIAGNOSIS — D539 Nutritional anemia, unspecified: Secondary | ICD-10-CM

## 2015-07-11 DIAGNOSIS — I129 Hypertensive chronic kidney disease with stage 1 through stage 4 chronic kidney disease, or unspecified chronic kidney disease: Secondary | ICD-10-CM | POA: Diagnosis not present

## 2015-07-11 LAB — POCT GLYCOSYLATED HEMOGLOBIN (HGB A1C): HEMOGLOBIN A1C: 6.7

## 2015-07-11 MED ORDER — AMLODIPINE BESYLATE 5 MG PO TABS: 5.0000 mg | ORAL_TABLET | Freq: Every day | ORAL | Status: DC

## 2015-07-11 MED ORDER — HYDROCHLOROTHIAZIDE 50 MG PO TABS: 50.0000 mg | ORAL_TABLET | Freq: Every day | ORAL | Status: DC

## 2015-07-11 MED ORDER — MELOXICAM 7.5 MG PO TABS: 7.5000 mg | ORAL_TABLET | Freq: Every day | ORAL | Status: DC

## 2015-07-11 MED ORDER — FERROUS SULFATE 325 (65 FE) MG PO TABS
325.0000 mg | ORAL_TABLET | Freq: Every day | ORAL | Status: DC
Start: 2015-07-11 — End: 2016-02-02

## 2015-07-11 NOTE — Telephone Encounter (Signed)
-----   Message from Margarita Rana, MD sent at 07/11/2015 12:19 PM EDT ----- Arthritis and degenerative disc disease as suspected. Thanks.

## 2015-07-11 NOTE — Telephone Encounter (Signed)
Patient advised as below.  

## 2015-07-11 NOTE — Progress Notes (Signed)
Patient ID: Miranda Olsen, female   DOB: 1940-11-15, 75 y.o.   MRN: [id number]         Patient: Miranda Olsen Female    DOB: 22-Dec-1940   75 y.o.   MRN: [id number] Visit Date: 07/11/2015  Today's Provider: Margarita Rana, MD   Chief Complaint  Patient presents with  . Hypertension  . Diabetes  . Hyperlipidemia   Subjective:    Hip Pain  There was no injury mechanism. The pain is present in the right hip and right knee (Right leg). The quality of the pain is described as aching (Throbbing). Associated symptoms include numbness (Left hand and Left foot goes numb and tingles.). Treatments tried: Stopped Pravastatin. The treatment provided no relief.      Diabetes Mellitus Type II, Follow-up:   Lab Results  Component Value Date   HGBA1C 5.9* 02/20/2015   HGBA1C 6.4 07/31/2014   HGBA1C 6.7* 04/03/2014   Last seen for diabetes 5 months ago.  Management since then includes None. She reports excellent compliance with treatment. She is not having side effects.  Current symptoms include none and have been stable. Home blood sugar records: Pt reports not checking her Blood Sugar at home.  Episodes of hypoglycemia? no   Most Recent Eye Exam: Has been over a year. Weight trend: stable Prior visit with dietician: no Current diet: in general, a "healthy" diet   Current exercise: walking  Pt reports she doesn't walk like she used to.    ------------------------------------------------------------------------   Hypertension, follow-up:  BP Readings from Last 3 Encounters:  07/11/15 136/84  02/07/15 134/82  01/29/15 154/82    She was last seen for hypertension 5 months ago.  Management since that visit includes None .She reports excellent compliance with treatment. She is not having side effects.  She is exercising. She is adherent to low salt diet.   Outside blood pressures are 130-140/80's. She is experiencing lower extremity edema.  Patient denies chest pain,  fatigue, irregular heart beat, syncope and tachypnea.   Cardiovascular risk factors include advanced age (older than 76 for men, 102 for women), diabetes mellitus and dyslipidemia.  ------------------------------------------------------------------------    Lipid/Cholesterol, Follow-up:   Last seen for this 5 months ago.   Management since that visit includes D/C Simvastatin and started Pravastatin.  Last Lipid Panel:    Component Value Date/Time   CHOL 259* 02/20/2015 0821   CHOL 201* 06/11/2013   TRIG 77 02/20/2015 0821   HDL 72 02/20/2015 0821   HDL 70 06/11/2013   CHOLHDL 3.1 07/31/2014 0955   LDLCALC 172* 02/20/2015 0821   LDLCALC 116 06/11/2013    She reports poor compliance with treatment.  Pt stopped Pravastatin a few weeks ago secondary to muscle pain. She is having side effects. She reports having muscle and joint pain.  She stopped Pravastatin a few weeks ago and she reports still having pain.    Wt Readings from Last 3 Encounters:  07/11/15 192 lb (87.091 kg)  02/07/15 189 lb (85.73 kg)  01/25/15 190 lb (86.183 kg)    ------------------------------------------------------------------------    No Known Allergies No outpatient prescriptions have been marked as taking for the 07/11/15 encounter (Office Visit) with Margarita Rana, MD.    Review of Systems  Constitutional: Negative.   Respiratory: Negative.   Cardiovascular: Positive for leg swelling (Ankles). Negative for chest pain and palpitations.  Gastrointestinal: Negative.   Endocrine: Negative.   Musculoskeletal: Positive for myalgias (Still having muscle pain. ) and arthralgias (Right  hip, knee and leg pain.  ). Negative for back pain, joint swelling, gait problem, neck pain and neck stiffness.  Neurological: Positive for numbness (Left hand and Left foot goes numb and tingles.). Negative for dizziness, tremors, weakness, light-headedness and headaches.    Social History  Substance Use Topics  .  Smoking status: Never Smoker   . Smokeless tobacco: Never Used  . Alcohol Use: 0.6 oz/week    1 Glasses of wine per week     Comment: occasional   Objective:   BP 136/84 mmHg  Pulse 84  Temp(Src) 98.4 F (36.9 C) (Oral)  Resp 16  Wt 192 lb (87.091 kg)  Physical Exam  Constitutional: She is oriented to person, place, and time. She appears well-developed and well-nourished.  Musculoskeletal: She exhibits tenderness (Over right SI joint. ).  No pain with internal and external rotation.  Reflexes symmetrical; no weakness noted.    Neurological: She is alert and oriented to person, place, and time.  Skin: Skin is warm and dry.  Psychiatric: She has a normal mood and affect. Her behavior is normal. Judgment and thought content normal.   Results for orders placed or performed in visit on 07/11/15  POCT glycosylated hemoglobin (Hb A1C)  Result Value Ref Range   Hemoglobin A1C 6.7         Assessment & Plan:     1. Benign hypertension with CKD (chronic kidney disease), stage II Stable; but having trouble with ankle swelling;  Will decrease Amlodipine to 5mg  a day and increase HCTZ to 50mg  a day.    - amLODipine (NORVASC) 5 MG tablet; Take 1 tablet (5 mg total) by mouth daily.  Dispense: 90 tablet; Refill: 1 - hydrochlorothiazide (HYDRODIURIL) 50 MG tablet; Take 1 tablet (50 mg total) by mouth daily.  Dispense: 90 tablet; Refill: 1  2. Diabetes mellitus without complication ([place]) 123XX123 slightly elevated at 6.7%;  No med changes for now.  Will recheck in three months with Tawanna Sat.  - POCT glycosylated hemoglobin (Hb A1C) - Comprehensive metabolic panel  3. Hypercholesteremia Stable; Pt advised to restart Pravastatin since that is likely NOT the cause of her muscle/joint pain.  Recheck labs in the Fall.  4. Anemia, deficiency Stable refills provided.   - ferrous sulfate 325 (65 FE) MG tablet; Take 1 tablet (325 mg total) by mouth daily.  Dispense: 90 tablet; Refill: 3  5.  Right-sided low back pain with right-sided sciatica Worsening; will obtain xray and start Meloxicam as below.   - DG Lumbar Spine 2-3 Views - meloxicam (MOBIC) 7.5 MG tablet; Take 1 tablet (7.5 mg total) by mouth daily.  Dispense: 30 tablet; Refill: 1  Patient was seen and examined by Jerrell Belfast, MD, and note scribed by Ashley Royalty, CMA.  I have reviewed the document for accuracy and completeness and I agree with above. - Jerrell Belfast, MD      Margarita Rana, MD  [facility]

## 2015-07-12 LAB — COMPREHENSIVE METABOLIC PANEL
A/G RATIO: 1.5 (ref 1.2–2.2)
ALBUMIN: 4.4 g/dL (ref 3.5–4.8)
ALT: 13 IU/L (ref 0–32)
AST: 13 IU/L (ref 0–40)
Alkaline Phosphatase: 57 IU/L (ref 39–117)
BUN / CREAT RATIO: 21 (ref 12–28)
BUN: 22 mg/dL (ref 8–27)
Bilirubin Total: 0.4 mg/dL (ref 0.0–1.2)
CALCIUM: 9.9 mg/dL (ref 8.7–10.3)
CO2: 25 mmol/L (ref 18–29)
Chloride: 98 mmol/L (ref 96–106)
Creatinine, Ser: 1.07 mg/dL — ABNORMAL HIGH (ref 0.57–1.00)
GFR, EST AFRICAN AMERICAN: 60 mL/min/{1.73_m2} (ref >59)
GFR, EST NON AFRICAN AMERICAN: 52 mL/min/{1.73_m2} — AB (ref >59)
GLUCOSE: 92 mg/dL (ref 65–99)
Globulin, Total: 2.9 g/dL (ref 1.5–4.5)
Potassium: 5.3 mmol/L — ABNORMAL HIGH (ref 3.5–5.2)
Sodium: 138 mmol/L (ref 134–144)
TOTAL PROTEIN: 7.3 g/dL (ref 6.0–8.5)

## 2015-07-14 ENCOUNTER — Telehealth: Payer: Self-pay

## 2015-07-14 DIAGNOSIS — N182 Chronic kidney disease, stage 2 (mild): Principal | ICD-10-CM

## 2015-07-14 DIAGNOSIS — I129 Hypertensive chronic kidney disease with stage 1 through stage 4 chronic kidney disease, or unspecified chronic kidney disease: Secondary | ICD-10-CM

## 2015-07-14 NOTE — Telephone Encounter (Signed)
-----   Message from Margarita Rana, MD sent at 07/13/2015  6:18 PM EDT ----- Kidney function improved. Mildly elevated potassium. Stay well hydrated and recheck later this week. May be lab error.  Thanks.

## 2015-07-14 NOTE — Telephone Encounter (Signed)
Tried calling; no answer  07/14/2015  Thanks,   -Mickel Baas

## 2015-07-15 NOTE — Telephone Encounter (Signed)
Tried calling; no answer.  07/15/2015  Thanks,   -Mickel Baas

## 2015-07-21 NOTE — Telephone Encounter (Signed)
Patient advised as below.  

## 2015-07-22 DIAGNOSIS — N182 Chronic kidney disease, stage 2 (mild): Secondary | ICD-10-CM | POA: Diagnosis not present

## 2015-07-22 DIAGNOSIS — I129 Hypertensive chronic kidney disease with stage 1 through stage 4 chronic kidney disease, or unspecified chronic kidney disease: Secondary | ICD-10-CM | POA: Diagnosis not present

## 2015-07-23 ENCOUNTER — Telehealth: Payer: Self-pay

## 2015-07-23 LAB — COMPREHENSIVE METABOLIC PANEL
ALBUMIN: 4.1 g/dL (ref 3.5–4.8)
ALT: 8 IU/L (ref 0–32)
AST: 13 IU/L (ref 0–40)
Albumin/Globulin Ratio: 1.2 (ref 1.2–2.2)
Alkaline Phosphatase: 50 IU/L (ref 39–117)
BILIRUBIN TOTAL: 0.3 mg/dL (ref 0.0–1.2)
BUN / CREAT RATIO: 24 (ref 12–28)
BUN: 25 mg/dL (ref 8–27)
CALCIUM: 9.7 mg/dL (ref 8.7–10.3)
CO2: 27 mmol/L (ref 18–29)
CREATININE: 1.03 mg/dL — AB (ref 0.57–1.00)
Chloride: 98 mmol/L (ref 96–106)
GFR, EST AFRICAN AMERICAN: 62 mL/min/{1.73_m2} (ref >59)
GFR, EST NON AFRICAN AMERICAN: 54 mL/min/{1.73_m2} — AB (ref >59)
GLUCOSE: 93 mg/dL (ref 65–99)
Globulin, Total: 3.3 g/dL (ref 1.5–4.5)
Potassium: 4.4 mmol/L (ref 3.5–5.2)
Sodium: 137 mmol/L (ref 134–144)
TOTAL PROTEIN: 7.4 g/dL (ref 6.0–8.5)

## 2015-07-23 NOTE — Telephone Encounter (Signed)
Tried calling; no answer 07/23/2015  Thanks,   -Mickel Baas

## 2015-07-23 NOTE — Telephone Encounter (Signed)
-----   Message from Margarita Rana, MD sent at 07/23/2015  9:30 AM EDT ----- Labs stable. Please notify patient. Thanks.

## 2015-07-24 NOTE — Telephone Encounter (Signed)
Tried calling; no answer 07/24/2015  Thanks,   -Mickel Baas

## 2015-07-24 NOTE — Telephone Encounter (Signed)
Pt advised.   Thanks,   -Laura  

## 2015-09-10 ENCOUNTER — Encounter: Payer: Self-pay | Admitting: Physician Assistant

## 2015-09-10 ENCOUNTER — Telehealth: Payer: Self-pay | Admitting: Physician Assistant

## 2015-09-10 ENCOUNTER — Ambulatory Visit (INDEPENDENT_AMBULATORY_CARE_PROVIDER_SITE_OTHER): Payer: Commercial Managed Care - HMO | Admitting: Physician Assistant

## 2015-09-10 VITALS — BP 130/96 | HR 72 | Temp 97.9°F | Resp 16 | Wt 192.2 lb

## 2015-09-10 DIAGNOSIS — N644 Mastodynia: Secondary | ICD-10-CM | POA: Diagnosis not present

## 2015-09-10 NOTE — Telephone Encounter (Signed)
Order placed

## 2015-09-10 NOTE — Patient Instructions (Signed)

## 2015-09-10 NOTE — Telephone Encounter (Signed)
Per [facility] they will also need an order for right breast (Limited) ultrasound.[id number] and clock position of pain

## 2015-09-10 NOTE — Progress Notes (Signed)
Patient: Miranda Olsen Female    DOB: [birth year]   75 y.o.   MRN: [id number] Visit Date: 09/10/2015  Today's Provider: Mar Daring, PA-C   Chief Complaint  Patient presents with  . Breast Pain   Subjective:    HPI Patient presents with left side breast pain. Patient states pain has been sharp and intermittent for the past 2 years. She reports that earlier this year the pain changed. Pain is now dull ache that comes and goes. She has been doing self breast exams and denies any changes. She does not notice tenderness to palpation when she does self breast exams. Family history is positive for breast cancer in her sister (sister just recently had recurrence this year and is going through treatment now), niece and her aunt.    Previous Medications   AMLODIPINE (NORVASC) 5 MG TABLET    Take 1 tablet (5 mg total) by mouth daily.   BENAZEPRIL (LOTENSIN) 20 MG TABLET    TAKE 1 TABLET BY MOUTH TWICE A DAY   CHOLECALCIFEROL (VITAMIN D) 1000 UNITS TABLET    Take 1,000 Units by mouth daily.    CYANOCOBALAMIN 1000 MCG TABLET    Take 1,000 mcg by mouth daily.    FERROUS SULFATE 325 (65 FE) MG TABLET    Take 1 tablet (325 mg total) by mouth daily.   HYDROCHLOROTHIAZIDE (HYDRODIURIL) 50 MG TABLET    Take 1 tablet (50 mg total) by mouth daily.   MELOXICAM (MOBIC) 7.5 MG TABLET    Take 1 tablet (7.5 mg total) by mouth daily.   METFORMIN (GLUCOPHAGE) 1000 MG TABLET    Take 1 tablet (1,000 mg total) by mouth 2 (two) times daily with a meal.   OMEPRAZOLE (PRILOSEC) 40 MG CAPSULE    1 CAPSULE BY MOUTH ONCE A DAY   PRAVASTATIN (PRAVACHOL) 10 MG TABLET    Take 1 tablet (10 mg total) by mouth at bedtime.   SENNOSIDES-DOCUSATE SODIUM (SENNA S PO)    Take 1 tablet by mouth 2 (two) times daily. Reported on 07/11/2015    Review of Systems  Constitutional: Negative.   Respiratory: Negative.        Breast tenderness/pain  Cardiovascular: Negative.   Gastrointestinal: Negative.   Musculoskeletal:   Breast pain   Psychiatric/Behavioral: Negative.     Social History  Substance Use Topics  . Smoking status: Never Smoker  . Smokeless tobacco: Never Used  . Alcohol use 0.6 oz/week    1 Glasses of wine per week     Comment: occasional   Objective:   BP (!) 130/96 (BP Location: Right Arm, Patient Position: Sitting, Cuff Size: Large)   Pulse 72   Temp 97.9 F (36.6 C) (Oral)   Resp 16   Wt 192 lb 3.2 oz (87.2 kg)   BMI 35.15 kg/m   Physical Exam  Constitutional: She appears well-developed and well-nourished. No distress.  Cardiovascular: Normal rate, regular rhythm and normal heart sounds.  Exam reveals no gallop and no friction rub.   No murmur heard. Pulmonary/Chest: Effort normal and breath sounds normal. No respiratory distress. She has no wheezes. She has no rales. Right breast exhibits no inverted nipple, no mass, no nipple discharge, no skin change and no tenderness. Left breast exhibits inverted nipple (stated the left nipple has always had the central area drawn in) and tenderness. Left breast exhibits no mass, no nipple discharge and no skin change. Breasts are symmetrical.    Musculoskeletal:  Cervical back: Normal.       Thoracic back: Normal.  Skin: She is not diaphoretic.  Vitals reviewed.      Assessment & Plan:     1. Breast pain, left Will order imaging as below for breast pain. No masses noted on exam. Question possible stretching/pulling of Cooper's ligaments as cause since she is quite large chested. - MM Digital Diagnostic Bilat; Future - US BREAST COMPLETE UNI LEFT INC AXILLA; Future   Follow up: No Follow-up on file.

## 2015-09-15 ENCOUNTER — Other Ambulatory Visit: Payer: Self-pay | Admitting: Physician Assistant

## 2015-09-15 DIAGNOSIS — N182 Chronic kidney disease, stage 2 (mild): Principal | ICD-10-CM

## 2015-09-15 DIAGNOSIS — I129 Hypertensive chronic kidney disease with stage 1 through stage 4 chronic kidney disease, or unspecified chronic kidney disease: Secondary | ICD-10-CM

## 2015-09-15 MED ORDER — HYDROCHLOROTHIAZIDE 50 MG PO TABS: 50.0000 mg | ORAL_TABLET | Freq: Every day | ORAL | 1 refills | Status: DC

## 2015-09-15 NOTE — Telephone Encounter (Signed)
Pt contacted office for refill request on the following medications:  hydrochlorothiazide (HYDRODIURIL) 50 MG tablet.  Pt is requesting this resent to [facility] mail order.  CB#[phone]/MW

## 2015-09-25 ENCOUNTER — Ambulatory Visit
Admission: RE | Admit: 2015-09-25 | Discharge: 2015-09-25 | Disposition: A | Discharge status: Home / Self Care | Payer: Commercial Managed Care - HMO | Source: Ambulatory Visit | Attending: Physician Assistant | Admitting: Physician Assistant

## 2015-09-25 ENCOUNTER — Other Ambulatory Visit: Payer: Self-pay | Admitting: Physician Assistant

## 2015-09-25 DIAGNOSIS — N644 Mastodynia: Secondary | ICD-10-CM

## 2015-09-26 ENCOUNTER — Telehealth: Payer: Self-pay

## 2015-09-26 NOTE — Telephone Encounter (Signed)
Patient advised as directed below.  Thanks,  -Keili Hasten 

## 2015-09-26 NOTE — Telephone Encounter (Signed)
-----   Message from Mar Daring, [place] sent at 09/26/2015 12:25 PM EDT ----- Normal mammogram. Repeat screening in one year.

## 2015-10-14 ENCOUNTER — Ambulatory Visit: Payer: Commercial Managed Care - HMO | Admitting: Physician Assistant

## 2015-10-29 ENCOUNTER — Ambulatory Visit (INDEPENDENT_AMBULATORY_CARE_PROVIDER_SITE_OTHER): Payer: Commercial Managed Care - HMO | Admitting: Physician Assistant

## 2015-10-29 ENCOUNTER — Encounter: Payer: Self-pay | Admitting: Physician Assistant

## 2015-10-29 DIAGNOSIS — E119 Type 2 diabetes mellitus without complications: Secondary | ICD-10-CM | POA: Diagnosis not present

## 2015-10-29 DIAGNOSIS — N182 Chronic kidney disease, stage 2 (mild): Secondary | ICD-10-CM | POA: Diagnosis not present

## 2015-10-29 DIAGNOSIS — D539 Nutritional anemia, unspecified: Secondary | ICD-10-CM | POA: Diagnosis not present

## 2015-10-29 DIAGNOSIS — I129 Hypertensive chronic kidney disease with stage 1 through stage 4 chronic kidney disease, or unspecified chronic kidney disease: Secondary | ICD-10-CM | POA: Diagnosis not present

## 2015-10-29 DIAGNOSIS — E7800 Pure hypercholesterolemia, unspecified: Secondary | ICD-10-CM

## 2015-10-29 LAB — POCT GLYCOSYLATED HEMOGLOBIN (HGB A1C)
ESTIMATED AVERAGE GLUCOSE: 143
Hemoglobin A1C: 6.6

## 2015-10-29 NOTE — Progress Notes (Signed)
Patient: Miranda Olsen Female    DOB: [birth year]   75 y.o.   MRN: [id number] Visit Date: 10/29/2015  Today's Provider: Mar Daring, PA-C   Chief Complaint  Patient presents with  . Follow-up    diabetes, hypertension,hypercholesteremia   Subjective:    HPI  Diabetes Mellitus Type II, Follow-up:   Lab Results  Component Value Date   HGBA1C 6.7 07/11/2015   HGBA1C 5.9 (H) 02/20/2015   HGBA1C 6.4 07/31/2014   Last seen for diabetes 3 months ago.  Management since then includes none. She reports excellent compliance with treatment. She is not having side effects.  Current symptoms include none and have been stable. Weight trend: stable Current diet: in general, an "unhealthy" diet Current exercise: none  ------------------------------------------------------------------------   Hypertension, follow-up:  BP Readings from Last 3 Encounters:  09/10/15 (!) 130/96  07/11/15 136/84  02/07/15 134/82    She was last seen for hypertension 3 months ago.  BP at that visit was 136/84. Management since that visit includes decreased Amlodipine to 5 MG and increased HCTZ to 50MG  .She reports excellent compliance with treatment. Patient reports she is unsure if she is taking amlodipine 5mg  or still taking the 10mg . She is not having side effects.  She is not exercising. She is adherent to low salt diet.   She is experiencing none.  Patient denies chest pain, chest pressure/discomfort, exertional chest pressure/discomfort, fatigue, irregular heart beat, lower extremity edema and palpitations.   Cardiovascular risk factors include advanced age (older than 67 for men, 23 for women), diabetes mellitus, dyslipidemia and hypertension.  ------------------------------------------------------------------------    Lipid/Cholesterol, Follow-up:   Last seen for this 3 months ago.  Management since that visit includes re started Pravastatin.  Last Lipid Panel:      Component Value Date/Time   CHOL 259 (H) 02/20/2015 0821   TRIG 77 02/20/2015 0821   HDL 72 02/20/2015 0821   CHOLHDL 3.1 07/31/2014 0955   LDLCALC 172 (H) 02/20/2015 0160    She reports excellent compliance with treatment. She is not having side effects.   Wt Readings from Last 3 Encounters:  09/10/15 192 lb 3.2 oz (87.2 kg)  07/11/15 192 lb (87.1 kg)  02/07/15 189 lb (85.7 kg)    ------------------------------------------------------------------------ Patient declined Influenza vaccine.    No Known Allergies   Current Outpatient Prescriptions:  .  amLODipine (NORVASC) 5 MG tablet, Take 1 tablet (5 mg total) by mouth daily., Disp: 90 tablet, Rfl: 1 .  benazepril (LOTENSIN) 20 MG tablet, TAKE 1 TABLET BY MOUTH TWICE A DAY, Disp: 180 tablet, Rfl: 5 .  ferrous sulfate 325 (65 FE) MG tablet, Take 1 tablet (325 mg total) by mouth daily., Disp: 90 tablet, Rfl: 3 .  hydrochlorothiazide (HYDRODIURIL) 50 MG tablet, Take 1 tablet (50 mg total) by mouth daily., Disp: 90 tablet, Rfl: 1 .  metFORMIN (GLUCOPHAGE) 1000 MG tablet, Take 1 tablet (1,000 mg total) by mouth 2 (two) times daily with a meal., Disp: 180 tablet, Rfl: 1 .  omeprazole (PRILOSEC) 40 MG capsule, 1 CAPSULE BY MOUTH ONCE A DAY, Disp: 90 capsule, Rfl: 1 .  pravastatin (PRAVACHOL) 10 MG tablet, Take 1 tablet (10 mg total) by mouth at bedtime., Disp: 90 tablet, Rfl: 1 .  Sennosides-Docusate Sodium (SENNA S PO), Take 1 tablet by mouth 2 (two) times daily. Reported on 07/11/2015, Disp: , Rfl:  .  cholecalciferol (VITAMIN D) 1000 UNITS tablet, Take 1,000 Units by mouth daily. ,  Disp: , Rfl:  .  cyanocobalamin 1000 MCG tablet, Take 1,000 mcg by mouth daily. , Disp: , Rfl:  .  meloxicam (MOBIC) 7.5 MG tablet, Take 1 tablet (7.5 mg total) by mouth daily. (Patient not taking: Reported on 10/29/2015), Disp: 30 tablet, Rfl: 1  Review of Systems  Constitutional: Negative.   Respiratory: Negative.   Cardiovascular: Positive for leg  swelling (ankles ). Negative for chest pain and palpitations.  Gastrointestinal: Negative.   Neurological: Negative for dizziness, light-headedness and headaches.    Social History  Substance Use Topics  . Smoking status: Never Smoker  . Smokeless tobacco: Never Used  . Alcohol use 0.6 oz/week    1 Glasses of wine per week     Comment: occasional   Objective:   There were no vitals taken for this visit.  Physical Exam  Constitutional: She appears well-developed and well-nourished. No distress.  Neck: Normal range of motion. Neck supple. No JVD present. No tracheal deviation present. No thyromegaly present.  Cardiovascular: Normal rate, regular rhythm and normal heart sounds.  Exam reveals no gallop and no friction rub.   No murmur heard. Pulmonary/Chest: Effort normal and breath sounds normal. No respiratory distress. She has no wheezes. She has no rales.  Musculoskeletal: She exhibits no edema.  Lymphadenopathy:    She has no cervical adenopathy.  Skin: She is not diaphoretic.  Vitals reviewed.     Assessment & Plan:     1. Benign hypertension with CKD (chronic kidney disease), stage II Stable. She is to check to see if she is taking amlodipine 5mg  or 10mg  and call to let us know. Will check labs as below. Continue amlodipine, HCTZ 50mg , benazepril 20mg . She will return in 3 months for AWV and we will see her back in 3 months after AWV (6 months from now) for HTN, HLD and T2DM f/u. - Comprehensive Metabolic Panel (CMET)  2. Diabetes mellitus without complication (HCC) Stable and at goal of < 7.0. HgBA1c is 6.6 today which is fairly stable, was 6.7. Continue metformin 1000mg  BID. Will recheck in 6 months. - POCT glycosylated hemoglobin (Hb A1C) - Comprehensive Metabolic Panel (CMET)  3. Anemia, deficiency Will check labs as below and f/u pending results. - CBC with Differential  4. Hypercholesteremia Stable on pravastatin 10mg . She does report occasional myalgias. Will  check labs as below and f/u pending results. If labs stable will see her back in 6 months. - Lipid Profile - CK (Creatine Kinase)       Mar Daring, PA-C  [facility]

## 2015-10-29 NOTE — Patient Instructions (Signed)
DASH Eating Plan  DASH stands for "Dietary Approaches to Stop Hypertension." The DASH eating plan is a healthy eating plan that has been shown to reduce high blood pressure (hypertension). Additional health benefits may include reducing the risk of type 2 diabetes mellitus, heart disease, and stroke. The DASH eating plan may also help with weight loss.  WHAT DO I NEED TO KNOW ABOUT THE DASH EATING PLAN?  For the DASH eating plan, you will follow these general guidelines:  · Choose foods with a percent daily value for sodium of less than 5% (as listed on the food label).  · Use salt-free seasonings or herbs instead of table salt or sea salt.  · Check with your health care provider or pharmacist before using salt substitutes.  · Eat lower-sodium products, often labeled as "lower sodium" or "no salt added."  · Eat fresh foods.  · Eat more vegetables, fruits, and low-fat dairy products.  · Choose whole grains. Look for the word "whole" as the first word in the ingredient list.  · Choose fish and skinless chicken or turkey more often than red meat. Limit fish, poultry, and meat to 6 oz (170 g) each day.  · Limit sweets, desserts, sugars, and sugary drinks.  · Choose heart-healthy fats.  · Limit cheese to 1 oz (28 g) per day.  · Eat more home-cooked food and less restaurant, buffet, and fast food.  · Limit fried foods.  · Cook foods using methods other than frying.  · Limit canned vegetables. If you do use them, rinse them well to decrease the sodium.  · When eating at a restaurant, ask that your food be prepared with less salt, or no salt if possible.  WHAT FOODS CAN I EAT?  Seek help from a dietitian for individual calorie needs.  Grains  Whole grain or whole wheat bread. Brown rice. Whole grain or whole wheat pasta. Quinoa, bulgur, and whole grain cereals. Low-sodium cereals. Corn or whole wheat flour tortillas. Whole grain cornbread. Whole grain crackers. Low-sodium crackers.  Vegetables  Fresh or frozen vegetables  (raw, steamed, roasted, or grilled). Low-sodium or reduced-sodium tomato and vegetable juices. Low-sodium or reduced-sodium tomato sauce and paste. Low-sodium or reduced-sodium canned vegetables.   Fruits  All fresh, canned (in natural juice), or frozen fruits.  Meat and Other Protein Products  Ground beef (85% or leaner), grass-fed beef, or beef trimmed of fat. Skinless chicken or turkey. Ground chicken or turkey. Pork trimmed of fat. All fish and seafood. Eggs. Dried beans, peas, or lentils. Unsalted nuts and seeds. Unsalted canned beans.  Dairy  Low-fat dairy products, such as skim or 1% milk, 2% or reduced-fat cheeses, low-fat ricotta or cottage cheese, or plain low-fat yogurt. Low-sodium or reduced-sodium cheeses.  Fats and Oils  Tub margarines without trans fats. Light or reduced-fat mayonnaise and salad dressings (reduced sodium). Avocado. Safflower, olive, or canola oils. Natural peanut or almond butter.  Other  Unsalted popcorn and pretzels.  The items listed above may not be a complete list of recommended foods or beverages. Contact your dietitian for more options.  WHAT FOODS ARE NOT RECOMMENDED?  Grains  White bread. White pasta. White rice. Refined cornbread. Bagels and croissants. Crackers that contain trans fat.  Vegetables  Creamed or fried vegetables. Vegetables in a cheese sauce. Regular canned vegetables. Regular canned tomato sauce and paste. Regular tomato and vegetable juices.  Fruits  Dried fruits. Canned fruit in light or heavy syrup. Fruit juice.  Meat and Other Protein   Products  Fatty cuts of meat. Ribs, chicken wings, bacon, sausage, bologna, salami, chitterlings, fatback, hot dogs, bratwurst, and packaged luncheon meats. Salted nuts and seeds. Canned beans with salt.  Dairy  Whole or 2% milk, cream, half-and-half, and cream cheese. Whole-fat or sweetened yogurt. Full-fat cheeses or blue cheese. Nondairy creamers and whipped toppings. Processed cheese, cheese spreads, or cheese  curds.  Condiments  Onion and garlic salt, seasoned salt, table salt, and sea salt. Canned and packaged gravies. Worcestershire sauce. Tartar sauce. Barbecue sauce. Teriyaki sauce. Soy sauce, including reduced sodium. Steak sauce. Fish sauce. Oyster sauce. Cocktail sauce. Horseradish. Ketchup and mustard. Meat flavorings and tenderizers. Bouillon cubes. Hot sauce. Tabasco sauce. Marinades. Taco seasonings. Relishes.  Fats and Oils  Butter, stick margarine, lard, shortening, ghee, and bacon fat. Coconut, palm kernel, or palm oils. Regular salad dressings.  Other  Pickles and olives. Salted popcorn and pretzels.  The items listed above may not be a complete list of foods and beverages to avoid. Contact your dietitian for more information.  WHERE CAN I FIND MORE INFORMATION?  National Heart, Lung, and Blood Institute: www.nhlbi.nih.gov/health/health-topics/topics/dash/     This information is not intended to replace advice given to you by your health care provider. Make sure you discuss any questions you have with your health care provider.     Document Released: 12/31/2010 Document Revised: 02/01/2014 Document Reviewed: 11/15/2012  Elsevier Interactive Patient Education ©2016 Elsevier Inc.

## 2015-10-31 DIAGNOSIS — N182 Chronic kidney disease, stage 2 (mild): Secondary | ICD-10-CM | POA: Diagnosis not present

## 2015-10-31 DIAGNOSIS — E119 Type 2 diabetes mellitus without complications: Secondary | ICD-10-CM | POA: Diagnosis not present

## 2015-10-31 DIAGNOSIS — E7800 Pure hypercholesterolemia, unspecified: Secondary | ICD-10-CM | POA: Diagnosis not present

## 2015-10-31 DIAGNOSIS — I129 Hypertensive chronic kidney disease with stage 1 through stage 4 chronic kidney disease, or unspecified chronic kidney disease: Secondary | ICD-10-CM | POA: Diagnosis not present

## 2015-10-31 DIAGNOSIS — D539 Nutritional anemia, unspecified: Secondary | ICD-10-CM | POA: Diagnosis not present

## 2015-11-01 LAB — LIPID PANEL
CHOLESTEROL TOTAL: 255 mg/dL — AB (ref 100–199)
Chol/HDL Ratio: 3.9 ratio units (ref 0.0–4.4)
HDL: 66 mg/dL (ref >39)
LDL Calculated: 170 mg/dL — ABNORMAL HIGH (ref 0–99)
TRIGLYCERIDES: 97 mg/dL (ref 0–149)
VLDL CHOLESTEROL CAL: 19 mg/dL (ref 5–40)

## 2015-11-01 LAB — COMPREHENSIVE METABOLIC PANEL
A/G RATIO: 1.6 (ref 1.2–2.2)
ALK PHOS: 51 IU/L (ref 39–117)
ALT: 5 IU/L (ref 0–32)
AST: 9 IU/L (ref 0–40)
Albumin: 4.2 g/dL (ref 3.5–4.8)
BILIRUBIN TOTAL: 0.3 mg/dL (ref 0.0–1.2)
BUN/Creatinine Ratio: 17 (ref 12–28)
BUN: 21 mg/dL (ref 8–27)
CHLORIDE: 99 mmol/L (ref 96–106)
CO2: 23 mmol/L (ref 18–29)
Calcium: 9.4 mg/dL (ref 8.7–10.3)
Creatinine, Ser: 1.21 mg/dL — ABNORMAL HIGH (ref 0.57–1.00)
GFR calc non Af Amer: 44 mL/min/{1.73_m2} — ABNORMAL LOW (ref >59)
GFR, EST AFRICAN AMERICAN: 51 mL/min/{1.73_m2} — AB (ref >59)
GLUCOSE: 84 mg/dL (ref 65–99)
Globulin, Total: 2.7 g/dL (ref 1.5–4.5)
POTASSIUM: 4.6 mmol/L (ref 3.5–5.2)
Sodium: 139 mmol/L (ref 134–144)
TOTAL PROTEIN: 6.9 g/dL (ref 6.0–8.5)

## 2015-11-01 LAB — CBC WITH DIFFERENTIAL/PLATELET
BASOS ABS: 0 10*3/uL (ref 0.0–0.2)
Basos: 0 %
EOS (ABSOLUTE): 0.1 10*3/uL (ref 0.0–0.4)
Eos: 2 %
Hematocrit: 33.2 % — ABNORMAL LOW (ref 34.0–46.6)
Hemoglobin: 11.1 g/dL (ref 11.1–15.9)
IMMATURE GRANS (ABS): 0 10*3/uL (ref 0.0–0.1)
Immature Granulocytes: 0 %
LYMPHS: 26 %
Lymphocytes Absolute: 1.5 10*3/uL (ref 0.7–3.1)
MCH: 28.6 pg (ref 26.6–33.0)
MCHC: 33.4 g/dL (ref 31.5–35.7)
MCV: 86 fL (ref 79–97)
MONOS ABS: 0.4 10*3/uL (ref 0.1–0.9)
Monocytes: 7 %
NEUTROS ABS: 3.9 10*3/uL (ref 1.4–7.0)
NEUTROS PCT: 65 %
PLATELETS: 268 10*3/uL (ref 150–379)
RBC: 3.88 x10E6/uL (ref 3.77–5.28)
RDW: 12.9 % (ref 12.3–15.4)
WBC: 5.9 10*3/uL (ref 3.4–10.8)

## 2015-11-01 LAB — CK: CK TOTAL: 49 U/L (ref 24–173)

## 2015-11-03 ENCOUNTER — Telehealth: Payer: Self-pay

## 2015-11-03 NOTE — Telephone Encounter (Signed)
-----   Message from Mar Daring, [place] sent at 11/01/2015  9:43 AM EDT ----- All labs are stable with exception of kidney function which is slightly declined from labs 8 months ago. Recommend pushing fluids and staying well hydrated and we will recheck in 1-2 weeks.

## 2015-11-03 NOTE — Telephone Encounter (Signed)
[id number] 

## 2015-11-03 NOTE — Telephone Encounter (Signed)
Not available.  Thanks,  -Alantis Bethune 

## 2015-11-04 NOTE — Telephone Encounter (Signed)
Unable to reach patient at this time, no voicemail box has been set up yet, will try and contact patient again at a later time.KW

## 2015-11-05 NOTE — Telephone Encounter (Signed)
Patient advised. Patient will call back to request lab slip in 2 weeks.

## 2015-12-30 ENCOUNTER — Other Ambulatory Visit: Payer: Self-pay | Admitting: Physician Assistant

## 2015-12-30 DIAGNOSIS — K219 Gastro-esophageal reflux disease without esophagitis: Secondary | ICD-10-CM

## 2015-12-30 DIAGNOSIS — E119 Type 2 diabetes mellitus without complications: Secondary | ICD-10-CM

## 2015-12-30 MED ORDER — OMEPRAZOLE 40 MG PO CPDR: 40.0000 mg | DELAYED_RELEASE_CAPSULE | Freq: Every day | ORAL | 1 refills | Status: DC

## 2015-12-30 MED ORDER — METFORMIN HCL 1000 MG PO TABS: 1000.0000 mg | ORAL_TABLET | Freq: Two times a day (BID) | ORAL | 1 refills | Status: DC

## 2015-12-30 NOTE — Telephone Encounter (Signed)
LOV 10/29/2015. Renaldo Fiddler, CMA

## 2015-12-30 NOTE — Telephone Encounter (Signed)
Pt contacted office for refill request on the following medications:  [facility] Mail order.  CB#[phone]/MW  metFORMIN (GLUCOPHAGE) 1000 MG tablet  omeprazole (PRILOSEC) 40 MG capsule

## 2016-01-02 ENCOUNTER — Telehealth: Payer: Self-pay | Admitting: Physician Assistant

## 2016-01-02 DIAGNOSIS — R7989 Other specified abnormal findings of blood chemistry: Secondary | ICD-10-CM

## 2016-01-02 NOTE — Telephone Encounter (Signed)
Ordered renal function panel.

## 2016-01-02 NOTE — Telephone Encounter (Signed)
Tried to call pt. No answer. Will try again later. °

## 2016-01-02 NOTE — Telephone Encounter (Signed)
Pt stated she was supposed to have called and requested a lab slip to have her kidney function rechecked. Pt would like to pick it up this morning if possible. Please advise. Thanks TNP

## 2016-01-02 NOTE — Telephone Encounter (Signed)
Would you like to order a renal or a met c and is that all she needs? Please advise.

## 2016-01-05 DIAGNOSIS — R7989 Other specified abnormal findings of blood chemistry: Secondary | ICD-10-CM | POA: Diagnosis not present

## 2016-01-06 ENCOUNTER — Telehealth: Payer: Self-pay

## 2016-01-06 LAB — RENAL FUNCTION PANEL
Albumin: 4.1 g/dL (ref 3.5–4.8)
BUN / CREAT RATIO: 17 (ref 12–28)
BUN: 20 mg/dL (ref 8–27)
CO2: 25 mmol/L (ref 18–29)
CREATININE: 1.17 mg/dL — AB (ref 0.57–1.00)
Calcium: 9.8 mg/dL (ref 8.7–10.3)
Chloride: 98 mmol/L (ref 96–106)
GFR calc Af Amer: 53 mL/min/{1.73_m2} — ABNORMAL LOW (ref >59)
GFR, EST NON AFRICAN AMERICAN: 46 mL/min/{1.73_m2} — AB (ref >59)
Glucose: 97 mg/dL (ref 65–99)
Phosphorus: 3.6 mg/dL (ref 2.5–4.5)
Potassium: 4.8 mmol/L (ref 3.5–5.2)
SODIUM: 138 mmol/L (ref 134–144)

## 2016-01-06 NOTE — Telephone Encounter (Signed)
Advised pt of lab results. Pt verbally acknowledges understanding. Si Jachim Drozdowski, CMA   

## 2016-01-06 NOTE — Telephone Encounter (Signed)
-----   Message from Mar Daring, PA-C sent at 01/06/2016  8:30 AM EST ----- Kidney function improved some. Continue to push fluids.

## 2016-02-02 ENCOUNTER — Ambulatory Visit (INDEPENDENT_AMBULATORY_CARE_PROVIDER_SITE_OTHER): Payer: Medicare HMO | Admitting: Physician Assistant

## 2016-02-02 ENCOUNTER — Encounter: Payer: Self-pay | Admitting: Physician Assistant

## 2016-02-02 DIAGNOSIS — D539 Nutritional anemia, unspecified: Secondary | ICD-10-CM

## 2016-02-02 DIAGNOSIS — E7800 Pure hypercholesterolemia, unspecified: Secondary | ICD-10-CM

## 2016-02-02 DIAGNOSIS — N182 Chronic kidney disease, stage 2 (mild): Secondary | ICD-10-CM | POA: Diagnosis not present

## 2016-02-02 DIAGNOSIS — Z0001 Encounter for general adult medical examination with abnormal findings: Secondary | ICD-10-CM | POA: Diagnosis not present

## 2016-02-02 DIAGNOSIS — Z0000 Encounter for general adult medical examination without abnormal findings: Secondary | ICD-10-CM

## 2016-02-02 DIAGNOSIS — I129 Hypertensive chronic kidney disease with stage 1 through stage 4 chronic kidney disease, or unspecified chronic kidney disease: Secondary | ICD-10-CM

## 2016-02-02 DIAGNOSIS — E119 Type 2 diabetes mellitus without complications: Secondary | ICD-10-CM | POA: Diagnosis not present

## 2016-02-02 MED ORDER — FERROUS SULFATE 325 (65 FE) MG PO TABS: 325.0000 mg | ORAL_TABLET | Freq: Every day | ORAL | 3 refills | Status: DC

## 2016-02-02 NOTE — Progress Notes (Signed)
Patient: Miranda Olsen, Female    DOB: 05-04-1940, 76 y.o.   MRN: [id number] Visit Date: 02/02/2016  Today's Provider: Mar Daring, PA-C   Chief Complaint  Patient presents with  . Medicare Wellness   Subjective:    Annual wellness visit Miranda Olsen is a 76 y.o. female. She feels well. She reports exercising none, try to keep self busy. She reports she is sleeping well.  AWE: 02-07-15 Mammogram:09/25/15 BI-RADS 1 BMD:11-20-13 BMD-Normal Colon:01/27/15 Diverticulosis, Internal Hemorrhoids Recheck in 5 years -----------------------------------------------------------   Review of Systems  Constitutional: Negative.   HENT: Positive for hearing loss.   Eyes: Negative.   Respiratory: Negative.   Cardiovascular: Negative.   Gastrointestinal: Negative.   Endocrine: Negative.   Genitourinary: Negative.   Musculoskeletal: Positive for arthralgias and neck stiffness.  Skin: Negative.   Allergic/Immunologic: Negative.   Neurological: Negative.   Hematological: Negative.   Psychiatric/Behavioral: Negative.     Social History   Social History  . Marital status: Widowed    Spouse name: N/A  . Number of children: 2  . Years of education: H/S   Occupational History  . Retired    Social History Main Topics  . Smoking status: Never Smoker  . Smokeless tobacco: Never Used  . Alcohol use 0.6 oz/week    1 Glasses of wine per week     Comment: occasional  . Drug use: No  . Sexual activity: Not on file   Other Topics Concern  . Not on file   Social History Narrative  . No narrative on file    Past Medical History:  Diagnosis Date  . Diabetes mellitus without complication ([place])   . Hypercholesterolemia   . Hypertension      Patient Active Problem List   Diagnosis Date Noted  . Low back pain 07/11/2015  . Acute blood loss anemia 01/25/2015  . Chronic kidney disease (CKD), stage III (moderate) 07/31/2014  . Dysfunction of eustachian tube  05/30/2014  . Acid reflux 05/30/2014  . Burning or prickling sensation 05/30/2014  . Deficiency of vitamin B 07/05/2008  . Anemia, deficiency 06/26/2008  . Avitaminosis D 06/26/2008  . Benign hypertension with CKD (chronic kidney disease), stage II 06/15/2008  . Hypercholesteremia 06/12/2008  . Diabetes mellitus without complication ([place]) 79/89/2119  . Colon, diverticulosis 06/06/2008  . Apnea, sleep 01/26/2007  . History of colon polyps 08/09/2006    Past Surgical History:  Procedure Laterality Date  . COLONOSCOPY WITH PROPOFOL N/A 01/27/2015   Procedure: COLONOSCOPY WITH PROPOFOL;  Surgeon: Josefine Class, MD;  Location: [facility] ENDOSCOPY;  Service: Endoscopy;  Laterality: N/A;  . EYE SURGERY Left 01/21/2009    cataract surgery   . EYE SURGERY Right 04/08/2009   cataract surgery  . REPLACEMENT TOTAL KNEE Left 10/08/2009   Dr. Marry Guan    Her family history includes Breast cancer (age of onset: 67) in her other; Breast cancer (age of onset: 84) in her sister; Breast cancer (age of onset: 40) in her maternal aunt; Cancer in her sister; Congestive Heart Failure in her sister and sister; Leukemia in her father; Lung cancer in her brother; Parkinsonism in her mother.      Current Outpatient Prescriptions:  .  amLODipine (NORVASC) 5 MG tablet, Take 1 tablet (5 mg total) by mouth daily., Disp: 90 tablet, Rfl: 1 .  benazepril (LOTENSIN) 20 MG tablet, TAKE 1 TABLET BY MOUTH TWICE A DAY, Disp: 180 tablet, Rfl: 5 .  cholecalciferol (VITAMIN  D) 1000 UNITS tablet, Take 1,000 Units by mouth daily. , Disp: , Rfl:  .  cyanocobalamin 1000 MCG tablet, Take 1,000 mcg by mouth daily. , Disp: , Rfl:  .  ferrous sulfate 325 (65 FE) MG tablet, Take 1 tablet (325 mg total) by mouth daily., Disp: 90 tablet, Rfl: 3 .  hydrochlorothiazide (HYDRODIURIL) 50 MG tablet, Take 1 tablet (50 mg total) by mouth daily., Disp: 90 tablet, Rfl: 1 .  metFORMIN (GLUCOPHAGE) 1000 MG tablet, Take 1 tablet (1,000 mg total) by  mouth 2 (two) times daily with a meal., Disp: 180 tablet, Rfl: 1 .  omeprazole (PRILOSEC) 40 MG capsule, Take 1 capsule (40 mg total) by mouth daily., Disp: 90 capsule, Rfl: 1 .  pravastatin (PRAVACHOL) 10 MG tablet, Take 1 tablet (10 mg total) by mouth at bedtime., Disp: 90 tablet, Rfl: 1 .  Sennosides-Docusate Sodium (SENNA S PO), Take 1 tablet by mouth 2 (two) times daily. Reported on 07/11/2015, Disp: , Rfl:   Patient Care Team: Mar Daring, PA-C as PCP - General (Family Medicine) Standley Brooking, RN as [place] [facility]     Objective:   Vitals: There were no vitals taken for this visit.  Physical Exam  Constitutional: She is oriented to person, place, and time. She appears well-developed and well-nourished. No distress.  HENT:  Head: Normocephalic and atraumatic.  Right Ear: Tympanic membrane, external ear and ear canal normal.  Left Ear: Tympanic membrane, external ear and ear canal normal.  Nose: Nose normal.  Mouth/Throat: Uvula is midline, oropharynx is clear and moist and mucous membranes are normal. No oropharyngeal exudate.  Eyes: Conjunctivae and EOM are normal. Pupils are equal, round, and reactive to light. Right eye exhibits no discharge. Left eye exhibits no discharge. No scleral icterus.  Neck: Normal range of motion. Neck supple. No JVD present. No tracheal deviation present. No thyromegaly present.  Cardiovascular: Normal rate, regular rhythm, normal heart sounds and intact distal pulses.  Exam reveals no gallop and no friction rub.   No murmur heard. Pulmonary/Chest: Effort normal and breath sounds normal. No respiratory distress. She has no wheezes. She has no rales. She exhibits no tenderness.  Abdominal: Soft. Bowel sounds are normal. She exhibits no distension and no mass. There is no tenderness. There is no rebound and no guarding.  Musculoskeletal: Normal range of motion. She exhibits no edema or tenderness.  Lymphadenopathy:      She has no cervical adenopathy.  Neurological: She is alert and oriented to person, place, and time.  Skin: Skin is warm and dry. No rash noted. She is not diaphoretic.  Psychiatric: She has a normal mood and affect. Her behavior is normal. Judgment and thought content normal.  Vitals reviewed.   Activities of Daily Living In your present state of health, do you have any difficulty performing the following activities: 02/02/2016 02/07/2015  Hearing? Tempie Donning  Vision? N N  Difficulty concentrating or making decisions? N N  Walking or climbing stairs? Y N  Dressing or bathing? N N  Doing errands, shopping? N N  Some recent data might be hidden    Fall Risk Assessment Fall Risk  02/02/2016 02/07/2015 07/31/2014  Falls in the past year? No No No     Depression Screen PHQ 2/9 Scores 02/02/2016 02/07/2015 07/31/2014  PHQ - 2 Score 0 0 0    Cognitive Testing - 6-CIT  Correct? Score   What year is it? yes 0 0 or 4  What month is it? yes 0 0 or 3  Memorize:    Pia Mau,  43,  [address]  [place],      What time is it? (within 1 hour) yes 0 0 or 3  Count backwards from 20 yes 0 0, 2, or 4  Name the months of the year yes 0 0, 2, or 4  Repeat name & address above yes 0 0, 2, 4, 6, 8, or 10       TOTAL SCORE  0/28   Interpretation:  Normal  Normal (0-7) Abnormal (8-28)   Audit-C Alcohol Use Screening  Question Answer Points  How often do you have alcoholic drink? never 0  On days you do drink alcohol, how many drinks do you typically consume? 0 0  How oftey will you drink 6 or more in a total? never 0  Total Score:  0   A score of 3 or more in women, and 4 or more in men indicates increased risk for alcohol abuse, EXCEPT if all of the points are from question 1.     Assessment & Plan:     Annual Wellness Visit  Reviewed patient's Family Medical History Reviewed and updated list of patient's medical providers Assessment of cognitive impairment was done Assessed patient's  functional ability Established a written schedule for health screening [place] Completed and Reviewed  Exercise Activities and Dietary recommendations Goals    None      Immunization History  Administered Date(s) Administered  . Pneumococcal Conjugate-13 10/03/2013  . Pneumococcal Polysaccharide-23 03/25/2011    Health Maintenance  Topic Date Due  . OPHTHALMOLOGY EXAM  08/26/1951  . TETANUS/TDAP  08/25/1960  . INFLUENZA VACCINE  04/24/2016 (Originally 08/26/2015)  . ZOSTAVAX  10/28/2016 (Originally 08/25/2001)  . FOOT EXAM  02/07/2016  . HEMOGLOBIN A1C  04/28/2016  . MAMMOGRAM  09/24/2017  . COLONOSCOPY  01/26/2025  . DEXA SCAN  Completed  . PNA vac Low Risk Adult  Completed     Discussed health benefits of physical activity, and encouraged her to engage in regular exercise appropriate for her age and condition.    1. Medicare annual wellness visit, subsequent Normal physical exam.  2. Anemia, deficiency H/O this and on ferrous sulfate supplementation. Will check labs as below and f/u pending results. - ferrous sulfate 325 (65 FE) MG tablet; Take 1 tablet (325 mg total) by mouth daily.  Dispense: 90 tablet; Refill: 3 - CBC w/Diff/Platelet  3. Benign hypertension with CKD (chronic kidney disease), stage II Stable. Continue amlodipine 5mg , benazepril 20mg , HCTZ 50mg . Will check labs as below and f/u pending results. - CBC w/Diff/Platelet - Comprehensive Metabolic Panel (CMET)  4. Diabetes mellitus without complication (HCC) Stable. Continue metformin 1000mg  BID. Will check labs as below and f/u pending results. - Comprehensive Metabolic Panel (CMET) - HgB A1c  5. Hypercholesteremia Stable. Continue pravastatin 10mg . Will check labs as below and f/u pending results. - Lipid Profile  ------------------------------------------------------------------------------------------------------------    Mar Daring, PA-C  [facility]

## 2016-02-02 NOTE — Patient Instructions (Signed)

## 2016-02-03 ENCOUNTER — Telehealth: Payer: Self-pay

## 2016-02-03 LAB — CBC WITH DIFFERENTIAL/PLATELET
BASOS ABS: 0 10*3/uL (ref 0.0–0.2)
Basos: 0 %
EOS (ABSOLUTE): 0.1 10*3/uL (ref 0.0–0.4)
EOS: 2 %
Hematocrit: 32.1 % — ABNORMAL LOW (ref 34.0–46.6)
Hemoglobin: 10.6 g/dL — ABNORMAL LOW (ref 11.1–15.9)
Immature Grans (Abs): 0 10*3/uL (ref 0.0–0.1)
Immature Granulocytes: 0 %
Lymphocytes Absolute: 1.4 10*3/uL (ref 0.7–3.1)
Lymphs: 29 %
MCH: 28.1 pg (ref 26.6–33.0)
MCHC: 33 g/dL (ref 31.5–35.7)
MCV: 85 fL (ref 79–97)
MONOS ABS: 0.4 10*3/uL (ref 0.1–0.9)
Monocytes: 8 %
NEUTROS PCT: 61 %
Neutrophils Absolute: 2.9 10*3/uL (ref 1.4–7.0)
PLATELETS: 287 10*3/uL (ref 150–379)
RBC: 3.77 x10E6/uL (ref 3.77–5.28)
RDW: 14.3 % (ref 12.3–15.4)
WBC: 4.9 10*3/uL (ref 3.4–10.8)

## 2016-02-03 LAB — COMPREHENSIVE METABOLIC PANEL
ALK PHOS: 51 IU/L (ref 39–117)
ALT: 8 IU/L (ref 0–32)
AST: 9 IU/L (ref 0–40)
Albumin/Globulin Ratio: 1.4 (ref 1.2–2.2)
Albumin: 4.1 g/dL (ref 3.5–4.8)
BUN/Creatinine Ratio: 16 (ref 12–28)
BUN: 19 mg/dL (ref 8–27)
Bilirubin Total: 0.2 mg/dL (ref 0.0–1.2)
CALCIUM: 9.5 mg/dL (ref 8.7–10.3)
CO2: 26 mmol/L (ref 18–29)
CREATININE: 1.18 mg/dL — AB (ref 0.57–1.00)
Chloride: 98 mmol/L (ref 96–106)
GFR calc Af Amer: 53 mL/min/{1.73_m2} — ABNORMAL LOW (ref >59)
GFR, EST NON AFRICAN AMERICAN: 46 mL/min/{1.73_m2} — AB (ref >59)
GLUCOSE: 96 mg/dL (ref 65–99)
Globulin, Total: 2.9 g/dL (ref 1.5–4.5)
Potassium: 4.5 mmol/L (ref 3.5–5.2)
Sodium: 138 mmol/L (ref 134–144)
Total Protein: 7 g/dL (ref 6.0–8.5)

## 2016-02-03 LAB — HEMOGLOBIN A1C
Est. average glucose Bld gHb Est-mCnc: 128 mg/dL
Hgb A1c MFr Bld: 6.1 % — ABNORMAL HIGH (ref 4.8–5.6)

## 2016-02-03 LAB — LIPID PANEL
CHOLESTEROL TOTAL: 311 mg/dL — AB (ref 100–199)
Chol/HDL Ratio: 4.2 ratio units (ref 0.0–4.4)
HDL: 74 mg/dL (ref >39)
LDL Calculated: 217 mg/dL — ABNORMAL HIGH (ref 0–99)
TRIGLYCERIDES: 98 mg/dL (ref 0–149)
VLDL CHOLESTEROL CAL: 20 mg/dL (ref 5–40)

## 2016-02-03 NOTE — Telephone Encounter (Signed)
Tried calling pt. No answer. Will try again later. Renaldo Fiddler, CMA

## 2016-02-03 NOTE — Telephone Encounter (Signed)
-----   Message from Mar Daring, [place] sent at 02/03/2016 11:56 AM EST ----- HgB is fairly stable at 10.6, continue ferrous sulfate. Kidney function stable. Continue pushing fluids. Cholesterol is up from previous labs. See if patient is taking pravastatin 10mg . If not she needs to restart. If she is may consider increasing pravastatin to 20mg  if tolerated.

## 2016-02-07 NOTE — Telephone Encounter (Signed)
Patient advised as below. Patient verbalizes understanding and is in agreement with treatment plan.  

## 2016-02-17 ENCOUNTER — Ambulatory Visit: Payer: Self-pay

## 2016-02-17 ENCOUNTER — Other Ambulatory Visit: Payer: Self-pay | Admitting: Physician Assistant

## 2016-02-17 DIAGNOSIS — N182 Chronic kidney disease, stage 2 (mild): Principal | ICD-10-CM

## 2016-02-17 DIAGNOSIS — I129 Hypertensive chronic kidney disease with stage 1 through stage 4 chronic kidney disease, or unspecified chronic kidney disease: Secondary | ICD-10-CM

## 2016-02-18 ENCOUNTER — Other Ambulatory Visit: Payer: Self-pay | Admitting: *Deleted

## 2016-02-18 DIAGNOSIS — E118 Type 2 diabetes mellitus with unspecified complications: Secondary | ICD-10-CM

## 2016-02-18 NOTE — Patient Outreach (Signed)
[facility]) Care Management  02/18/2016  Miranda Olsen 07-Nov-1940 [id number]  Referral received on 1/23 for screening on this pt for possible [facility] services.  RN spoke with pt today via screening on the [facility] services and possible assistance. RN explained the purpose of today's call and was able to completed a screening along with review of pt's medications. Pt indicates she has family (daughters) to assist with her daily care with no problems but admits she does not monitor her diabetes with daily glucose sticks. States this is something she "just does not do". Pt however indicated she can start but does not have a glucometer machine. RN further discussed options for [facility] services to assist with his matter as pt very adamant indicating she can handle instructions on this machine and does not wish to receiver any home visits from a community case manager. Pt has indicated that she does not know the signs or symptom if her CBGs were high or low but does not have any problems healing of her wounds or changes. RN offered educated pt on some symptoms she may not be able to recognize with changes in her glucose levels and educated on the risk involved if this goes unmonitored. Stress the importance of knowing her glucose levels and reporting to her provider with abnormal readings alone with any signs or symptoms that are noted.  Pt indicated she is willing to start this process of monitoring her glucose levels if [facility] assist with obtaining a glucose monitoring. Offered to refer pt to a pharmacy with [facility] to assist with obtaining orders for her glucometer and supplies and strongly encouraged pt to take daily (pt hesitant but willing to take weekly). RN has also requested to refer pt to a telephonic disease manageenet nurse to contact her telephonically for ongoing education and teachings on diabetes. Will also informed the telephonic nurse to send pt EMMI information on possible symptoms encountered  with hypo-hyperglycemia signs and what to do if acute. Based upon pt's agreeing to intact with both a pharmacy and disease management nurse but only telephonically will completed these referrals and noted details on pt's needs at this time.   Raina Mina, RN Care Management Coordinator [place] Office [phone]

## 2016-02-19 ENCOUNTER — Other Ambulatory Visit: Payer: Self-pay | Admitting: Pharmacist

## 2016-02-19 NOTE — Patient Outreach (Signed)
[facility]) Care Management  02/19/2016  CORIANA ANGELLO 1940-08-07 [id number]  Patient was referred to [facility] by [facility] for assistance in obtaining a glucometer.   Per referral, patient declines home visits.    Successful phone call to patient on 02/19/16, HIPAA details verified.  Patient did not wish to review medications over the phone but states she is obtaining her medications from [facility].   Patient reports she does not have a glucometer, she isn't sure if she discussed diabetes self management with her PCP or not.  Counseled patient that glucometer and testing supplies are typically covered by Medicare Part B and can have up to 20% co-insurance for Medicare beneficiaries.    Counseled patient she could contact her [place] service phone number to see if her insurance offers a specific meter.   Patient was counseled that [facility] Pharmacist is not able to tell her exact cost for diabetic testing supplies as Medicare beneficiaries may be charged co-insurance.  Plan:  Will route this note to PCP as patient would need a prescription in order to have glucometer and testing supplies covered by her insurance.    Will place follow-up call to patient if necessary.    Karrie Meres, PharmD, [place] [phone]

## 2016-02-24 ENCOUNTER — Other Ambulatory Visit: Payer: Self-pay | Admitting: Physician Assistant

## 2016-02-24 DIAGNOSIS — E119 Type 2 diabetes mellitus without complications: Secondary | ICD-10-CM

## 2016-02-24 MED ORDER — ONETOUCH ULTRASOFT LANCETS MISC: 3 refills | Status: DC

## 2016-02-24 MED ORDER — ONETOUCH ULTRA SYSTEM W/DEVICE KIT: PACK | 0 refills | Status: DC

## 2016-02-24 MED ORDER — GLUCOSE BLOOD VI STRP: ORAL_STRIP | 4 refills | Status: DC

## 2016-02-24 NOTE — Progress Notes (Signed)
Order placed for glucometer

## 2016-02-27 ENCOUNTER — Other Ambulatory Visit: Payer: Self-pay | Admitting: Pharmacist

## 2016-02-27 NOTE — Patient Outreach (Signed)
[facility]) Care Management  02/27/2016  Miranda Olsen 31-May-1940 [id number]  Follow-up phone call to patient regarding glucometer.  Gentleman answered phone and stated patient was not available.  HIPAA compliant message left requesting patient return call to [facility] Pharmacist.   Plan:  Will make outreach attempt next week if no return call.   Karrie Meres, PharmD, [place] [phone]

## 2016-03-03 ENCOUNTER — Other Ambulatory Visit: Payer: Self-pay | Admitting: Pharmacist

## 2016-03-03 NOTE — Patient Outreach (Signed)
[facility]) Care Management  03/03/2016  Miranda Olsen 1940/08/03 [id number]  Second outreach attempt to patient to follow-up if she obtained glucometer.    No answer, unable to leave voicemail.    Plan:  Will make another outreach attempt to patient in the next week.   Karrie Meres, PharmD, [place] [phone]

## 2016-03-04 ENCOUNTER — Other Ambulatory Visit: Payer: Self-pay | Admitting: Pharmacist

## 2016-03-04 NOTE — Patient Outreach (Signed)
[facility]) Care Management  03/04/2016  Miranda Olsen [birth year] [id number]  Successful outreach call to patient, HIPAA details verified.   Following up with patient to verify if patient obtained glucometer yet.  Per chart review, Joette Catching, PA had issued prescription for glucometer and testing supplies 02/24/16.    Patient reports she has already spoken with [facility] mail order and she is waiting for glucometer and supplies to arrive in mail from her pharmacy.   Plan:  Will place follow-up call to patient next week to see if she received glucometer and testing supplies.   Karrie Meres, PharmD, [place] [phone]

## 2016-03-12 ENCOUNTER — Other Ambulatory Visit: Payer: Self-pay | Admitting: Pharmacist

## 2016-03-12 NOTE — Patient Outreach (Signed)
[facility]) Care Management  03/12/2016  Miranda Olsen 1940-08-01 [id number]  Unsuccessful follow-up attempt to patient to see if she obtained glucometer.  No answer, unable to leave voice message.   Plan:  Will make a follow-up attempt next week.   Karrie Meres, PharmD, [place] [phone]

## 2016-03-15 ENCOUNTER — Other Ambulatory Visit: Payer: Self-pay | Admitting: Pharmacist

## 2016-03-15 NOTE — Patient Outreach (Signed)
[facility]) Care Management  03/15/2016  Miranda Olsen January 01, 1941 [id number]  Successful phone outreach to patient, HIPAA details verified.   Patient reports she received glucometer from [employer].  When asked, she also reports she received lancets and test strips for meter.   She states she has not yet used meter to check blood sugar as she reports she needs to get alcohol swaps.    Patient denies other pharmacy related questions or concerns.  She denies questions/concerns with glucometer.    Plan:  Will close case---referral was initially to help patient obtain glucometer and this has been achieved.   Patient aware she can contact [facility] Pharmacist if new pharmacy related questions/concerns arise.   Karrie Meres, PharmD, [place] [phone]

## 2016-05-21 ENCOUNTER — Other Ambulatory Visit: Payer: Self-pay | Admitting: Physician Assistant

## 2016-05-21 DIAGNOSIS — E119 Type 2 diabetes mellitus without complications: Secondary | ICD-10-CM

## 2016-05-21 DIAGNOSIS — K219 Gastro-esophageal reflux disease without esophagitis: Secondary | ICD-10-CM

## 2016-07-05 ENCOUNTER — Other Ambulatory Visit: Payer: Self-pay | Admitting: Physician Assistant

## 2016-07-05 DIAGNOSIS — N182 Chronic kidney disease, stage 2 (mild): Principal | ICD-10-CM

## 2016-07-05 DIAGNOSIS — I129 Hypertensive chronic kidney disease with stage 1 through stage 4 chronic kidney disease, or unspecified chronic kidney disease: Secondary | ICD-10-CM

## 2016-07-05 MED ORDER — AMLODIPINE BESYLATE 5 MG PO TABS: 5.0000 mg | ORAL_TABLET | Freq: Every day | ORAL | 1 refills | Status: DC

## 2016-07-05 MED ORDER — BENAZEPRIL HCL 20 MG PO TABS: 20.0000 mg | ORAL_TABLET | Freq: Two times a day (BID) | ORAL | 1 refills | Status: DC

## 2016-07-05 NOTE — Telephone Encounter (Addendum)
[facility] faxed a refill request on the following medications:  amLODipine (NORVASC) 5 MG tablet.  Take 1 tablet by mouth daily.  90 day supply.  benazepril (LOTENSIN) 20 MG tablet.  Take 2 tablets by mouth every morning.  [facility] mail [facility]

## 2016-07-05 NOTE — Telephone Encounter (Signed)
Meds refilled.

## 2016-07-20 ENCOUNTER — Other Ambulatory Visit: Payer: Self-pay | Admitting: Physician Assistant

## 2016-07-20 DIAGNOSIS — N182 Chronic kidney disease, stage 2 (mild): Principal | ICD-10-CM

## 2016-07-20 DIAGNOSIS — I129 Hypertensive chronic kidney disease with stage 1 through stage 4 chronic kidney disease, or unspecified chronic kidney disease: Secondary | ICD-10-CM

## 2016-07-21 ENCOUNTER — Telehealth: Payer: Self-pay | Admitting: Physician Assistant

## 2016-07-21 NOTE — Telephone Encounter (Signed)
Patient advised that I verified with one of the  [facility]. Spoke with Fabio Bering who states the Medication for Hydrochlorothiazide was shipped yesterday to patient's home.  Thanks,  -Joseline

## 2016-07-21 NOTE — Telephone Encounter (Signed)
Pt called wanting to know if her Rx for hydrochlorothiazide (HYDRODIURIL) 50 MG tablet has been faxed to [facility].  This was originally requested a couple weeks ago per pt.  Pt has not heard anything from pharmacy or recd the Rx.  Pt's call back [phone]  Thanks teri

## 2016-08-02 ENCOUNTER — Ambulatory Visit: Payer: Medicare HMO | Admitting: Physician Assistant

## 2016-08-10 ENCOUNTER — Other Ambulatory Visit: Payer: Self-pay | Admitting: Physician Assistant

## 2016-08-10 DIAGNOSIS — D539 Nutritional anemia, unspecified: Secondary | ICD-10-CM

## 2016-08-10 NOTE — Telephone Encounter (Signed)
LOV/AWV 02/02/2016. Hgb was 10.6 at that time.

## 2016-08-31 ENCOUNTER — Ambulatory Visit (INDEPENDENT_AMBULATORY_CARE_PROVIDER_SITE_OTHER): Payer: Medicare HMO | Admitting: Physician Assistant

## 2016-08-31 ENCOUNTER — Encounter: Payer: Self-pay | Admitting: Physician Assistant

## 2016-08-31 VITALS — BP 134/80 | HR 76 | Temp 98.2°F | Resp 16 | Wt 192.0 lb

## 2016-08-31 DIAGNOSIS — N183 Chronic kidney disease, stage 3 (moderate): Secondary | ICD-10-CM

## 2016-08-31 DIAGNOSIS — I129 Hypertensive chronic kidney disease with stage 1 through stage 4 chronic kidney disease, or unspecified chronic kidney disease: Secondary | ICD-10-CM | POA: Diagnosis not present

## 2016-08-31 DIAGNOSIS — R809 Proteinuria, unspecified: Secondary | ICD-10-CM | POA: Diagnosis not present

## 2016-08-31 DIAGNOSIS — M6208 Separation of muscle (nontraumatic), other site: Secondary | ICD-10-CM

## 2016-08-31 DIAGNOSIS — E1129 Type 2 diabetes mellitus with other diabetic kidney complication: Secondary | ICD-10-CM | POA: Diagnosis not present

## 2016-08-31 LAB — POCT UA - MICROALBUMIN: Microalbumin Ur, POC: 50 mg/L

## 2016-08-31 LAB — POCT GLYCOSYLATED HEMOGLOBIN (HGB A1C): Hemoglobin A1C: 6.1

## 2016-08-31 NOTE — Progress Notes (Signed)
Patient: Miranda Olsen Female    DOB: 01/22/1941   76 y.o.   MRN: [id number] Visit Date: 08/31/2016  Today's Provider: Mar Daring, PA-C   Chief Complaint  Patient presents with  . Hypertension  . Diabetes   Subjective:    HPI  Diabetes Mellitus Type II, Follow-up:   Lab Results  Component Value Date   HGBA1C 6.1 (H) 02/02/2016   HGBA1C 6.6 10/29/2015   HGBA1C 6.7 07/11/2015    Last seen for diabetes 7 months ago.  Management since then includes none. She reports good compliance with treatment. She is not having side effects.  Home blood sugar records: not being checked because pt does not know how to use her meter but forgot to brign it today.  Episodes of hypoglycemia? no   Current Insulin Regimen: n/a Most Recent Eye Exam:  Current exercise: walking  Pertinent Labs:    Component Value Date/Time   CHOL 311 (H) 02/02/2016 0955   TRIG 98 02/02/2016 0955   HDL 74 02/02/2016 0955   LDLCALC 217 (H) 02/02/2016 0955   CREATININE 1.18 (H) 02/02/2016 0955    Wt Readings from Last 3 Encounters:  08/31/16 192 lb (87.1 kg)  09/10/15 192 lb 3.2 oz (87.2 kg)  07/11/15 192 lb (87.1 kg)    ------------------------------------------------------------------------   Hypertension, follow-up:  BP Readings from Last 3 Encounters:  08/31/16 134/80  09/10/15 (!) 130/96  07/11/15 136/84    She was last seen for hypertension 7 months ago.  BP at that visit was 130/96. Management since that visit includes none. She reports good compliance with treatment. She is not having side effects.  She is exercising. She is adherent to low salt diet.   Outside blood pressures are not being checked. Patient denies chest pain, chest pressure/discomfort, claudication, dyspnea, exertional chest pressure/discomfort, fatigue, irregular heart beat, lower extremity edema, near-syncope, orthopnea, palpitations, paroxysmal nocturnal dyspnea, syncope and tachypnea.     Cardiovascular risk factors include advanced age (older than 7 for men, 55 for women), diabetes mellitus, dyslipidemia, hypertension and obesity (BMI >= 30 kg/m2).   ------------------------------------------------------------------------     No Known Allergies   Current Outpatient Prescriptions:  .  amLODipine (NORVASC) 5 MG tablet, Take 1 tablet (5 mg total) by mouth daily., Disp: 90 tablet, Rfl: 1 .  benazepril (LOTENSIN) 20 MG tablet, Take 1 tablet (20 mg total) by mouth 2 (two) times daily., Disp: 180 tablet, Rfl: 1 .  cholecalciferol (VITAMIN D) 1000 UNITS tablet, Take 1,000 Units by mouth daily. , Disp: , Rfl:  .  cyanocobalamin 1000 MCG tablet, Take 1,000 mcg by mouth daily. , Disp: , Rfl:  .  ferrous sulfate 325 (65 FE) MG tablet, TAKE 1 TABLET (325 MG TOTAL) BY MOUTH DAILY., Disp: 90 tablet, Rfl: 1 .  hydrochlorothiazide (HYDRODIURIL) 50 MG tablet, TAKE 1 TABLET EVERY DAY, Disp: 90 tablet, Rfl: 1 .  metFORMIN (GLUCOPHAGE) 1000 MG tablet, TAKE 1 TABLET (1,000 MG TOTAL) BY MOUTH 2 (TWO) TIMES DAILY WITH A MEAL., Disp: 180 tablet, Rfl: 1 .  omeprazole (PRILOSEC) 40 MG capsule, TAKE 1 CAPSULE (40 MG TOTAL) BY MOUTH DAILY., Disp: 90 capsule, Rfl: 1 .  pravastatin (PRAVACHOL) 10 MG tablet, Take 1 tablet (10 mg total) by mouth at bedtime., Disp: 90 tablet, Rfl: 1 .  Blood Glucose Monitoring Suppl (ONE TOUCH ULTRA SYSTEM KIT) w/Device KIT, [place] blood sugar once per day (Patient not taking: Reported on 08/31/2016), Disp: 1 each, Rfl: 0 .  glucose blood test strip, Check blood sugar once daily (Patient not taking: Reported on 08/31/2016), Disp: 100 each, Rfl: 4 .  Lancets (ONETOUCH ULTRASOFT) lancets, Check blood sugar once daily (Patient not taking: Reported on 08/31/2016), Disp: 100 each, Rfl: 3 .  Sennosides-Docusate Sodium (SENNA S PO), Take 1 tablet by mouth 2 (two) times daily. Reported on 07/11/2015, Disp: , Rfl:   Review of Systems  Constitutional: Negative.   HENT: Negative.   Eyes:  Negative.   Respiratory: Negative.   Cardiovascular: Negative.   Gastrointestinal: Positive for abdominal pain.  Endocrine: Negative.   Genitourinary: Negative.   Musculoskeletal: Positive for arthralgias.  Skin: Negative.   Allergic/Immunologic: Negative.   Neurological: Negative.   Hematological: Negative.   Psychiatric/Behavioral: Negative.     Social History  Substance Use Topics  . Smoking status: Never Smoker  . Smokeless tobacco: Never Used  . Alcohol use 0.6 oz/week    1 Glasses of wine per week     Comment: occasional   Objective:   BP 134/80 (BP Location: Left Arm, Patient Position: Sitting, Cuff Size: Large)   Pulse 76   Temp 98.2 F (36.8 C) (Oral)   Resp 16   Wt 192 lb (87.1 kg)   BMI 35.12 kg/m  Vitals:   08/31/16 0852  BP: 134/80  Pulse: 76  Resp: 16  Temp: 98.2 F (36.8 C)  TempSrc: Oral  Weight: 192 lb (87.1 kg)     Physical Exam  Constitutional: She appears well-developed and well-nourished. No distress.  Neck: Normal range of motion. Neck supple. No JVD present. No tracheal deviation present. No thyromegaly present.  Cardiovascular: Normal rate, regular rhythm and normal heart sounds.  Exam reveals no gallop and no friction rub.   No murmur heard. Pulmonary/Chest: Effort normal and breath sounds normal. No respiratory distress. She has no wheezes. She has no rales.  Abdominal: Soft. Normal appearance and bowel sounds are normal. She exhibits mass. There is no hepatosplenomegaly. There is no tenderness. There is no rebound, no guarding and no CVA tenderness.    Musculoskeletal: She exhibits no edema.  Lymphadenopathy:    She has no cervical adenopathy.  Skin: She is not diaphoretic.  Vitals reviewed.       Assessment & Plan:     1. Type 2 diabetes mellitus with microalbuminuria, without long-term current use of insulin (HCC) A1c improved to 6.1. Microalbumin stable at 50. Continue metformin '1000mg'$  BID. Continue dietary modifications. I  will see her back in 6 months for CPE/AWV. - POCT HgB A1C - POCT UA - Microalbumin  2. Benign hypertension with CKD (chronic kidney disease) stage III Stable on amlodipine '5mg'$ , benazepril '20mg'$ , and HCTZ '50mg'$ .   3. Diastasis recti Discussed how to avoid irritation and work on strengthening exercises. She is to call if this worsens.        Mar Daring, PA-C  [facility]

## 2016-08-31 NOTE — Patient Instructions (Signed)
Diabetes Mellitus and Food It is important for you to manage your blood sugar (glucose) level. Your blood glucose level can be greatly affected by what you eat. Eating healthier foods in the appropriate amounts throughout the day at about the same time each day will help you control your blood glucose level. It can also help slow or prevent worsening of your diabetes mellitus. Healthy eating may even help you improve the level of your blood pressure and reach or maintain a healthy weight. General recommendations for healthful eating and cooking habits include:  Eating meals and snacks regularly. Avoid going long periods of time without eating to lose weight.  Eating a diet that consists mainly of plant-based foods, such as fruits, vegetables, nuts, legumes, and whole grains.  Using low-heat cooking methods, such as baking, instead of high-heat cooking methods, such as deep frying.  Work with your dietitian to make sure you understand how to use the Nutrition Facts information on food labels. How can food affect me? Carbohydrates Carbohydrates affect your blood glucose level more than any other type of food. Your dietitian will help you determine how many carbohydrates to eat at each meal and teach you how to count carbohydrates. Counting carbohydrates is important to keep your blood glucose at a healthy level, especially if you are using insulin or taking certain medicines for diabetes mellitus. Alcohol Alcohol can cause sudden decreases in blood glucose (hypoglycemia), especially if you use insulin or take certain medicines for diabetes mellitus. Hypoglycemia can be a life-threatening condition. Symptoms of hypoglycemia (sleepiness, dizziness, and disorientation) are similar to symptoms of having too much alcohol. If your health care provider has given you approval to drink alcohol, do so in moderation and use the following guidelines:  Women should not have more than one drink per day, and men  should not have more than two drinks per day. One drink is equal to: ? 12 oz of beer. ? 5 oz of wine. ? 1 oz of hard liquor.  Do not drink on an empty stomach.  Keep yourself hydrated. Have water, diet soda, or unsweetened iced tea.  Regular soda, juice, and other mixers might contain a lot of carbohydrates and should be counted.  What foods are not recommended? As you make food choices, it is important to remember that all foods are not the same. Some foods have fewer nutrients per serving than other foods, even though they might have the same number of calories or carbohydrates. It is difficult to get your body what it needs when you eat foods with fewer nutrients. Examples of foods that you should avoid that are high in calories and carbohydrates but low in nutrients include:  Trans fats (most processed foods list trans fats on the Nutrition Facts label).  Regular soda.  Juice.  Candy.  Sweets, such as cake, pie, doughnuts, and cookies.  Fried foods.  What foods can I eat? Eat nutrient-rich foods, which will nourish your body and keep you healthy. The food you should eat also will depend on several factors, including:  The calories you need.  The medicines you take.  Your weight.  Your blood glucose level.  Your blood pressure level.  Your cholesterol level.  You should eat a variety of foods, including:  Protein. ? Lean cuts of meat. ? Proteins low in saturated fats, such as fish, egg whites, and beans. Avoid processed meats.  Fruits and vegetables. ? Fruits and vegetables that may help control blood glucose levels, such as apples,   mangoes, and yams.  Dairy products. ? Choose fat-free or low-fat dairy products, such as milk, yogurt, and cheese.  Grains, bread, pasta, and rice. ? Choose whole grain products, such as multigrain bread, whole oats, and brown rice. These foods may help control blood pressure.  Fats. ? Foods containing healthful fats, such as  nuts, avocado, olive oil, canola oil, and fish.  Does everyone with diabetes mellitus have the same meal plan? Because every person with diabetes mellitus is different, there is not one meal plan that works for everyone. It is very important that you meet with a dietitian who will help you create a meal plan that is just right for you. This information is not intended to replace advice given to you by your health care provider. Make sure you discuss any questions you have with your health care provider. Document Released: 10/08/2004 Document Revised: 06/19/2015 Document Reviewed: 12/08/2012 Elsevier Interactive Patient Education  2017 Elsevier Inc.  

## 2016-10-11 ENCOUNTER — Other Ambulatory Visit: Payer: Self-pay | Admitting: Physician Assistant

## 2016-10-11 DIAGNOSIS — E119 Type 2 diabetes mellitus without complications: Secondary | ICD-10-CM

## 2016-10-11 DIAGNOSIS — K219 Gastro-esophageal reflux disease without esophagitis: Secondary | ICD-10-CM

## 2016-11-17 ENCOUNTER — Other Ambulatory Visit: Payer: Self-pay | Admitting: Physician Assistant

## 2016-11-17 DIAGNOSIS — N182 Chronic kidney disease, stage 2 (mild): Principal | ICD-10-CM

## 2016-11-17 DIAGNOSIS — I129 Hypertensive chronic kidney disease with stage 1 through stage 4 chronic kidney disease, or unspecified chronic kidney disease: Secondary | ICD-10-CM

## 2016-12-07 ENCOUNTER — Other Ambulatory Visit: Payer: Self-pay | Admitting: Physician Assistant

## 2016-12-07 DIAGNOSIS — N182 Chronic kidney disease, stage 2 (mild): Principal | ICD-10-CM

## 2016-12-07 DIAGNOSIS — I129 Hypertensive chronic kidney disease with stage 1 through stage 4 chronic kidney disease, or unspecified chronic kidney disease: Secondary | ICD-10-CM

## 2016-12-28 ENCOUNTER — Telehealth: Payer: Self-pay | Admitting: Physician Assistant

## 2016-12-28 DIAGNOSIS — I10 Essential (primary) hypertension: Secondary | ICD-10-CM

## 2016-12-28 NOTE — Telephone Encounter (Signed)
The recall was only for one manufacturer. Has she been contacted by her pharmacy that it was included? If not, then she is most likely not included in the recall. If so, I will change her.

## 2016-12-28 NOTE — Telephone Encounter (Signed)
Patient advised as directed below. Per patient she has not been contacted by her pharmacy but doesn't want to risk it and wants a different medication. Patient reports she has taken the medication since she heard about it and prefers a different medication.  Thanks,  -Chung Chagoya

## 2016-12-28 NOTE — Telephone Encounter (Signed)
Please Review.  Thanks,  -Joseline 

## 2016-12-28 NOTE — Telephone Encounter (Signed)
Pt states there has been a recall on the Rx amLODipine (NORVASC) 5 MG tablet Pt is request a different medication due to the recall.    [facility] mail order.  CB#[phone]/[facility]

## 2016-12-29 ENCOUNTER — Other Ambulatory Visit: Payer: Self-pay | Admitting: Physician Assistant

## 2016-12-29 DIAGNOSIS — I129 Hypertensive chronic kidney disease with stage 1 through stage 4 chronic kidney disease, or unspecified chronic kidney disease: Secondary | ICD-10-CM

## 2016-12-29 DIAGNOSIS — N182 Chronic kidney disease, stage 2 (mild): Principal | ICD-10-CM

## 2016-12-29 MED ORDER — AMLODIPINE BESYLATE 5 MG PO TABS
5.0000 mg | ORAL_TABLET | Freq: Every day | ORAL | 3 refills | Status: DC
Start: 2016-12-29 — End: 2017-02-02

## 2016-12-29 MED ORDER — DILTIAZEM HCL ER 60 MG PO CP12: 60.0000 mg | ORAL_CAPSULE | Freq: Two times a day (BID) | ORAL | 1 refills | Status: DC

## 2016-12-29 NOTE — Telephone Encounter (Signed)
Per more research the only amlodipine recalled is the one in the combination with valsartan which is the inciting drug that caused the recall in the first place. It is not for amlodipine alone.

## 2016-12-29 NOTE — Telephone Encounter (Signed)
Patient advised.

## 2016-12-29 NOTE — Telephone Encounter (Signed)
Per patient she still don't want to take the medication. She report that's if she gets cancer she will sued.

## 2016-12-29 NOTE — Telephone Encounter (Signed)
Stop amlodipine.  Start diltiazem 60mg  BID. Rx sent to [facility]

## 2017-02-02 ENCOUNTER — Other Ambulatory Visit: Payer: Self-pay | Admitting: Physician Assistant

## 2017-02-02 DIAGNOSIS — I10 Essential (primary) hypertension: Secondary | ICD-10-CM

## 2017-02-02 MED ORDER — AMLODIPINE BESYLATE 5 MG PO TABS: 5.0000 mg | ORAL_TABLET | Freq: Every day | ORAL | 3 refills | Status: DC

## 2017-02-02 NOTE — Telephone Encounter (Signed)
Sent in amlodipine 5mg  to [facility]

## 2017-02-02 NOTE — Telephone Encounter (Signed)
Patient advised.

## 2017-02-02 NOTE — Telephone Encounter (Signed)
Patient states that the medicine you change her to from the amLODipine (NORVASC) 5 MG tablet [facility] states that they do not have that medicine will not not have to no time soon, so she just wants to go back on her amLODipine (NORVASC) 5 MG tablet and she needs refill on it  CB# [phone]

## 2017-02-16 ENCOUNTER — Other Ambulatory Visit: Payer: Self-pay | Admitting: Physician Assistant

## 2017-02-16 DIAGNOSIS — I10 Essential (primary) hypertension: Secondary | ICD-10-CM

## 2017-02-16 MED ORDER — AMLODIPINE BESYLATE 5 MG PO TABS: 5.0000 mg | ORAL_TABLET | Freq: Every day | ORAL | 3 refills | Status: DC

## 2017-02-16 NOTE — Telephone Encounter (Signed)
[facility] faxed refill request for the following medications:  amLODipine (NORVASC) 5 MG tablet   90 day supply  Last Rx: 02/02/17 (Status was No Print) LOV: 08/31/16 NOV: 03/03/17 Please advise. Thanks TNP

## 2017-02-21 DIAGNOSIS — Z961 Presence of intraocular lens: Secondary | ICD-10-CM | POA: Diagnosis not present

## 2017-02-21 LAB — HM DIABETES EYE EXAM

## 2017-02-23 ENCOUNTER — Encounter: Payer: Self-pay | Admitting: Physician Assistant

## 2017-02-24 ENCOUNTER — Ambulatory Visit (INDEPENDENT_AMBULATORY_CARE_PROVIDER_SITE_OTHER): Payer: Medicare HMO

## 2017-02-24 VITALS — BP 142/88 | HR 68 | Temp 98.4°F | Ht 62.0 in | Wt 202.0 lb

## 2017-02-24 DIAGNOSIS — Z0000 Encounter for general adult medical examination without abnormal findings: Secondary | ICD-10-CM | POA: Diagnosis not present

## 2017-02-24 NOTE — Patient Instructions (Signed)
Miranda Olsen , Thank you for taking time to come for your Medicare Wellness Visit. I appreciate your ongoing commitment to your health goals. Please review the following plan we discussed and let me know if I can assist you in the future.   Screening recommendations/referrals: Colonoscopy: Up to date Mammogram: Up to date Bone Density: Up to date Recommended yearly ophthalmology/optometry visit for glaucoma screening and checkup Recommended yearly dental visit for hygiene and checkup  Vaccinations: Influenza vaccine: Pt declines today.  Pneumococcal vaccine: Up to date Tdap vaccine: Pt declines today.  Shingles vaccine: Pt declines today.     Advanced directives: Advance directive discussed with you today. Even though you declined this today please call our office should you change your mind and we can give you the proper paperwork for you to fill out.  Conditions/risks identified: Fall risk prevention; Obesity- recommend decreasing amount of carbohydrates consumed in daily diet.   Next appointment: 03/03/17 @ 9:00 AM   Preventive Care 65 Years and Older, Female Preventive care refers to lifestyle choices and visits with your health care provider that can promote health and wellness. What does preventive care include?  A yearly physical exam. This is also called an annual well check.  Dental exams once or twice a year.  Routine eye exams. Ask your health care provider how often you should have your eyes checked.  Personal lifestyle choices, including:  Daily care of your teeth and gums.  Regular physical activity.  Eating a healthy diet.  Avoiding tobacco and drug use.  Limiting alcohol use.  Practicing safe sex.  Taking low-dose aspirin every day.  Taking vitamin and mineral supplements as recommended by your health care provider. What happens during an annual well check? The services and screenings done by your health care provider during your annual well check  will depend on your age, overall health, lifestyle risk factors, and family history of disease. Counseling  Your health care provider may ask you questions about your:  Alcohol use.  Tobacco use.  Drug use.  Emotional well-being.  Home and relationship well-being.  Sexual activity.  Eating habits.  History of falls.  Memory and ability to understand (cognition).  Work and work Statistician.  Reproductive health. Screening  You may have the following tests or measurements:  Height, weight, and BMI.  Blood pressure.  Lipid and cholesterol levels. These may be checked every 5 years, or more frequently if you are over 65 years old.  Skin check.  Lung cancer screening. You may have this screening every year starting at age 47 if you have a 30-pack-year history of smoking and currently smoke or have quit within the past 15 years.  Fecal occult blood test (FOBT) of the stool. You may have this test every year starting at age 95.  Flexible sigmoidoscopy or colonoscopy. You may have a sigmoidoscopy every 5 years or a colonoscopy every 10 years starting at age 20.  Hepatitis C blood test.  Hepatitis B blood test.  Sexually transmitted disease (STD) testing.  Diabetes screening. This is done by checking your blood sugar (glucose) after you have not eaten for a while (fasting). You may have this done every 1-3 years.  Bone density scan. This is done to screen for osteoporosis. You may have this done starting at age 23.  Mammogram. This may be done every 1-2 years. Talk to your health care provider about how often you should have regular mammograms. Talk with your health care provider about your test  results, treatment options, and if necessary, the need for more tests. Vaccines  Your health care provider may recommend certain vaccines, such as:  Influenza vaccine. This is recommended every year.  Tetanus, diphtheria, and acellular pertussis (Tdap, Td) vaccine. You may  need a Td booster every 10 years.  Zoster vaccine. You may need this after age 55.  Pneumococcal 13-valent conjugate (PCV13) vaccine. One dose is recommended after age 89.  Pneumococcal polysaccharide (PPSV23) vaccine. One dose is recommended after age 44. Talk to your health care provider about which screenings and vaccines you need and how often you need them. This information is not intended to replace advice given to you by your health care provider. Make sure you discuss any questions you have with your health care provider. Document Released: 02/07/2015 Document Revised: 10/01/2015 Document Reviewed: 11/12/2014 Elsevier Interactive Patient Education  2017 [place] Prevention in the Home Falls can cause injuries. They can happen to people of all ages. There are many things you can do to make your home safe and to help prevent falls. What can I do on the outside of my home?  Regularly fix the edges of walkways and driveways and fix any cracks.  Remove anything that might make you trip as you walk through a door, such as a raised step or threshold.  Trim any bushes or trees on the path to your home.  Use bright outdoor lighting.  Clear any walking paths of anything that might make someone trip, such as rocks or tools.  Regularly check to see if handrails are loose or broken. Make sure that both sides of any steps have handrails.  Any raised decks and porches should have guardrails on the edges.  Have any leaves, snow, or ice cleared regularly.  Use sand or salt on walking paths during winter.  Clean up any spills in your garage right away. This includes oil or grease spills. What can I do in the bathroom?  Use night lights.  Install grab bars by the toilet and in the tub and shower. Do not use towel bars as grab bars.  Use non-skid mats or decals in the tub or shower.  If you need to sit down in the shower, use a plastic, non-slip stool.  Keep the floor  dry. Clean up any water that spills on the floor as soon as it happens.  Remove soap buildup in the tub or shower regularly.  Attach bath mats securely with double-sided non-slip rug tape.  Do not have throw rugs and other things on the floor that can make you trip. What can I do in the bedroom?  Use night lights.  Make sure that you have a light by your bed that is easy to reach.  Do not use any sheets or blankets that are too big for your bed. They should not hang down onto the floor.  Have a firm chair that has side arms. You can use this for support while you get dressed.  Do not have throw rugs and other things on the floor that can make you trip. What can I do in the kitchen?  Clean up any spills right away.  Avoid walking on wet floors.  Keep items that you use a lot in easy-to-reach places.  If you need to reach something above you, use a strong step stool that has a grab bar.  Keep electrical cords out of the way.  Do not use floor polish or wax that makes  floors slippery. If you must use wax, use non-skid floor wax.  Do not have throw rugs and other things on the floor that can make you trip. What can I do with my stairs?  Do not leave any items on the stairs.  Make sure that there are handrails on both sides of the stairs and use them. Fix handrails that are broken or loose. Make sure that handrails are as long as the stairways.  Check any carpeting to make sure that it is firmly attached to the stairs. Fix any carpet that is loose or worn.  Avoid having throw rugs at the top or bottom of the stairs. If you do have throw rugs, attach them to the floor with carpet tape.  Make sure that you have a light switch at the top of the stairs and the bottom of the stairs. If you do not have them, ask someone to add them for you. What else can I do to help prevent falls?  Wear shoes that:  Do not have high heels.  Have rubber bottoms.  Are comfortable and fit you  well.  Are closed at the toe. Do not wear sandals.  If you use a stepladder:  Make sure that it is fully opened. Do not climb a closed stepladder.  Make sure that both sides of the stepladder are locked into place.  Ask someone to hold it for you, if possible.  Clearly mark and make sure that you can see:  Any grab bars or handrails.  First and last steps.  Where the edge of each step is.  Use tools that help you move around (mobility aids) if they are needed. These include:  Canes.  Walkers.  Scooters.  Crutches.  Turn on the lights when you go into a dark area. Replace any light bulbs as soon as they burn out.  Set up your furniture so you have a clear path. Avoid moving your furniture around.  If any of your floors are uneven, fix them.  If there are any pets around you, be aware of where they are.  Review your medicines with your doctor. Some medicines can make you feel dizzy. This can increase your chance of falling. Ask your doctor what other things that you can do to help prevent falls. This information is not intended to replace advice given to you by your health care provider. Make sure you discuss any questions you have with your health care provider. Document Released: 11/07/2008 Document Revised: 06/19/2015 Document Reviewed: 02/15/2014 Elsevier Interactive Patient Education  2017 [employer].

## 2017-02-24 NOTE — Progress Notes (Signed)
Subjective:   Miranda Olsen is a 77 y.o. female who presents for Medicare Annual (Subsequent) preventive examination.  Review of Systems:  N/A  Cardiac Risk Factors include: advanced age (>85mn, >>28women);dyslipidemia;hypertension;obesity (BMI >30kg/m2)     Objective:     Vitals: BP (!) 142/88 (BP Location: Right Arm)   Pulse 68   Temp 98.4 F (36.9 C) (Oral)   Ht '5\' 2"'$  (1.575 m)   Wt 202 lb (91.6 kg)   BMI 36.95 kg/m   Body mass index is 36.95 kg/m.  Advanced Directives 02/24/2017 01/26/2015 12/19/2014  Does Patient Have a Medical Advance Directive? No No No  Would patient like information on creating a medical advance directive? No - Patient declined - -    Tobacco Social History   Tobacco Use  Smoking Status Never Smoker  Smokeless Tobacco Never Used     Counseling given: Not Answered   Clinical Intake:  Pre-visit preparation completed: Yes  Pain : No/denies pain(Only has right knee pain when walking.) Pain Score: 0-No pain     Nutritional Status: BMI > 30  Obese Nutritional Risks: None Diabetes: Yes(type 2) CBG done?: No Did pt. bring in CBG monitor from home?: No  How often do you need to have someone help you when you read instructions, pamphlets, or other written materials from your doctor or pharmacy?: 1 - Never  Interpreter Needed?: No  Information [facility] :: [facility] L[facility]  Past Medical History:  Diagnosis Date  . [place]   . Hypercholesterolemia   . Hypertension    Past Surgical History:  Procedure Laterality Date  . COLONOSCOPY WITH PROPOFOL N/A 01/27/2015   Procedure: COLONOSCOPY WITH PROPOFOL;  Surgeon: MJosefine Class MD;  [facility];  Service: Endoscopy;  Laterality: N/A;  . EYE SURGERY Left 01/21/2009    cataract surgery   . EYE SURGERY Right 04/08/2009   cataract surgery  . REPLACEMENT TOTAL KNEE Left 10/08/2009   Dr. HMarry Guan  Family History  Problem Relation Age  of Onset  . Parkinsonism Mother   . Leukemia Father   . Cancer Sister        breast  . Breast cancer Sister 513 . Congestive Heart Failure Sister   . Congestive Heart Failure Sister   . Lung cancer Brother   . Breast cancer Maternal Aunt 60  . Breast cancer Other 40   Social History   Socioeconomic History  . Marital status: Widowed    Spouse name: None  . Number of children: 2  . Years of education: H/S  . Highest education level: 11th grade  Social Needs  . Financial resource strain: Not very hard  . Food insecurity - worry: Never true  . Food insecurity - inability: Never true  . Transportation needs - medical: No  . Transportation needs - non-medical: No  Occupational History  . Occupation: Retired  Tobacco Use  . Smoking status: Never Smoker  . Smokeless tobacco: Never Used  Substance and Sexual Activity  . Alcohol use: Yes    Comment: occasionally 1 glass of wine  . Drug use: No  . Sexual activity: None  Other Topics Concern  . None  Social History Narrative  . None    Outpatient Encounter Medications as of 02/24/2017  Medication Sig  . amLODipine (NORVASC) 5 MG tablet Take 1 tablet (5 mg total) by mouth daily.  . benazepril (LOTENSIN) 20 MG tablet TAKE 1 TABLET TWICE DAILY  .  Blood Glucose Monitoring Suppl (ONE TOUCH ULTRA SYSTEM KIT) w/Device KIT [place] blood sugar once per day  . cholecalciferol (VITAMIN D) 1000 UNITS tablet Take 1,000 Units by mouth daily.   . ferrous sulfate 325 (65 FE) MG tablet TAKE 1 TABLET (325 MG TOTAL) BY MOUTH DAILY.  Marland Kitchen glucose blood test strip Check blood sugar once daily  . hydrochlorothiazide (HYDRODIURIL) 50 MG tablet TAKE 1 TABLET EVERY DAY (Patient taking differently: TAKE 1 TABLET EVERY DAY (25 mg?))  . Lancets (ONETOUCH ULTRASOFT) lancets Check blood sugar once daily  . metFORMIN (GLUCOPHAGE) 1000 MG tablet TAKE 1 TABLET TWICE DAILY WITH MEALS  . omeprazole (PRILOSEC) 40 MG capsule TAKE 1 CAPSULE EVERY DAY  . pravastatin  (PRAVACHOL) 10 MG tablet Take 1 tablet (10 mg total) by mouth at bedtime.  . cyanocobalamin 1000 MCG tablet Take 1,000 mcg by mouth daily.   . [DISCONTINUED] Sennosides-Docusate Sodium (SENNA S PO) Take 1 tablet by mouth 2 (two) times daily. Reported on 07/11/2015   No facility-administered encounter medications on file as of 02/24/2017.     Activities of Daily Living In your present state of health, do you have any difficulty performing the following activities: 02/24/2017  Hearing? Y  Comment Slight trouble hearing- pt has seen an audiologist for this.   Vision? N  Difficulty concentrating or making decisions? N  Walking or climbing stairs? Y  Comment Due to knee pain.  Dressing or bathing? N  Doing errands, shopping? N  Preparing Food and eating ? N  Using the Toilet? N  In the past six months, have you accidently leaked urine? Y  Comment Occasionally, does not wear protection.  Do you have problems with loss of bowel control? N  Managing your Medications? N  Managing your Finances? N  Housekeeping or managing your Housekeeping? N  Some recent data might be hidden    Patient Care Team: Mar Daring, PA-C as PCP - General (Family Medicine) Birder Robson, MD as Referring Physician (Ophthalmology)    Assessment:   This is a routine wellness examination for Miranda Olsen.  Exercise Activities and Dietary recommendations Current Exercise Habits: The patient does not participate in regular exercise at present, Exercise limited by: orthopedic condition(s)  Goals    . DIET - REDUCE CARBOHYDRATE INTAKE     Recommend decreasing amount of carbohydrates consumed in daily diet.        Fall Risk Fall Risk  02/24/2017 02/02/2016 02/07/2015 07/31/2014  Falls in the past year? Yes No No No  Number falls in past yr: 1 - - -  Injury with Fall? No - - -  Follow up Falls prevention discussed - - -   Is the patient's home free of loose throw rugs in walkways, pet beds, electrical cords,  etc?   yes      Grab bars in the bathroom? no      Handrails on the stairs?   no      Adequate lighting?   yes  Timed Get Up and Go performed: N/A  Depression Screen PHQ 2/9 Scores 02/24/2017 02/24/2017 02/18/2016 02/02/2016  PHQ - 2 Score 0 0 0 0  PHQ- 9 Score 3 - - -     Cognitive Function: Pt declined screening today.        Immunization History  Administered Date(s) Administered  . Pneumococcal Conjugate-13 10/03/2013  . Pneumococcal Polysaccharide-23 03/25/2011    Qualifies for Shingles Vaccine? Due for Shingles vaccine. Declined my offer to administer today. Education has  been provided regarding the importance of this vaccine. Pt has been advised to call her insurance company to determine her out of pocket expense. Advised she may also receive this vaccine at her local pharmacy or Health Dept. Verbalized acceptance and understanding.  Screening Tests Health Maintenance  Topic Date Due  . TETANUS/TDAP  08/25/1960  . FOOT EXAM  02/07/2016  . INFLUENZA VACCINE  06/24/2017 (Originally 08/25/2016)  . HEMOGLOBIN A1C  03/03/2017  . OPHTHALMOLOGY EXAM  02/21/2018  . COLONOSCOPY  01/26/2025  . DEXA SCAN  Completed  . PNA vac Low Risk Adult  Completed    Cancer Screenings: Lung: Low Dose CT Chest recommended if Age 38-80 years, 30 pack-year currently smoking OR have quit w/in 15years. Patient does not qualify. Breast:  Up to date on Mammogram? Yes   Up to date of Bone Density/Dexa? Yes Colorectal: Up to date  Additional Screenings:  Hepatitis B/HIV/Syphillis: Pt declines today.  Hepatitis C Screening: Pt declines today.      Plan:  I have personally reviewed and addressed the Medicare Annual Wellness questionnaire and have noted the following in the patient's chart:  A. Medical and social history B. Use of alcohol, tobacco or illicit drugs  C. Current medications and supplements D. Functional ability and status E.  Nutritional status F.  Physical activity G. Advance  directives H. List of other physicians I.  Hospitalizations, surgeries, and ER visits in previous 12 months J.  [place] such as hearing and vision if needed, cognitive and depression L. Referrals and appointments - none  In addition, I have reviewed and discussed with patient certain preventive protocols, quality metrics, and best practice recommendations. A written personalized care plan for preventive services as well as general preventive health recommendations were provided to patient.  See attached scanned questionnaire for additional information.   Signed,  Fabio Neighbors, LPN Nurse Health Advisor   Nurse Recommendations: Pt needs a diabetic foot exam at next OV. Pt declined the tetanus and influenza vaccine today.

## 2017-03-03 ENCOUNTER — Encounter: Payer: Self-pay | Admitting: Physician Assistant

## 2017-03-03 ENCOUNTER — Ambulatory Visit (INDEPENDENT_AMBULATORY_CARE_PROVIDER_SITE_OTHER): Payer: Medicare HMO | Admitting: Physician Assistant

## 2017-03-03 VITALS — BP 120/80 | HR 73 | Temp 98.1°F | Resp 16 | Ht 62.0 in | Wt 200.0 lb

## 2017-03-03 DIAGNOSIS — E7800 Pure hypercholesterolemia, unspecified: Secondary | ICD-10-CM | POA: Diagnosis not present

## 2017-03-03 DIAGNOSIS — E559 Vitamin D deficiency, unspecified: Secondary | ICD-10-CM

## 2017-03-03 DIAGNOSIS — I10 Essential (primary) hypertension: Secondary | ICD-10-CM | POA: Diagnosis not present

## 2017-03-03 DIAGNOSIS — N183 Chronic kidney disease, stage 3 unspecified: Secondary | ICD-10-CM

## 2017-03-03 DIAGNOSIS — R1901 Right upper quadrant abdominal swelling, mass and lump: Secondary | ICD-10-CM | POA: Diagnosis not present

## 2017-03-03 DIAGNOSIS — E1129 Type 2 diabetes mellitus with other diabetic kidney complication: Secondary | ICD-10-CM | POA: Diagnosis not present

## 2017-03-03 DIAGNOSIS — D539 Nutritional anemia, unspecified: Secondary | ICD-10-CM

## 2017-03-03 DIAGNOSIS — E539 Vitamin B deficiency, unspecified: Secondary | ICD-10-CM | POA: Diagnosis not present

## 2017-03-03 DIAGNOSIS — R809 Proteinuria, unspecified: Secondary | ICD-10-CM | POA: Diagnosis not present

## 2017-03-03 DIAGNOSIS — Z0000 Encounter for general adult medical examination without abnormal findings: Secondary | ICD-10-CM

## 2017-03-03 DIAGNOSIS — Z1231 Encounter for screening mammogram for malignant neoplasm of breast: Secondary | ICD-10-CM | POA: Diagnosis not present

## 2017-03-03 DIAGNOSIS — Z1239 Encounter for other screening for malignant neoplasm of breast: Secondary | ICD-10-CM

## 2017-03-03 MED ORDER — AMLODIPINE BESYLATE 5 MG PO TABS: 5.0000 mg | ORAL_TABLET | Freq: Every day | ORAL | 3 refills | Status: DC

## 2017-03-03 NOTE — Progress Notes (Signed)
Patient: Miranda Olsen, Female    DOB: 06-01-1940, 77 y.o.   MRN: [id number] Visit Date: 03/03/2017  Today's Provider: Mar Daring, PA-C   Chief Complaint  Patient presents with  . Medicare Wellness   Subjective:     Complete Physical Miranda Olsen is a 77 y.o. female. She feels well. She reports exercising active with daily activities. She reports she is sleeping well.  02/24/17 AWV- with Mckenzie 02/02/16 AWE 09/25/15 Mammogram-BI-RADS 1 11/20/13 BMD-normal 01/27/15 Colonoscopy-Diverticulosis, internal hemorrhoids  -----------------------------------------------------------   Review of Systems  Constitutional: Positive for fatigue.  HENT: Positive for dental problem.   Eyes: Negative.   Respiratory: Negative.   Cardiovascular: Negative.   Gastrointestinal: Positive for abdominal pain.  Endocrine: Negative.   Genitourinary: Positive for urgency.  Musculoskeletal: Positive for arthralgias, back pain, joint swelling and myalgias.  Skin: Negative.   Allergic/Immunologic: Negative.   Neurological: Negative.   Hematological: Negative.   Psychiatric/Behavioral: Negative.     Social History   Socioeconomic History  . Marital status: Widowed    Spouse name: Not on file  . Number of children: 2  . Years of education: H/S  . Highest education level: 11th grade  Social Needs  . Financial resource strain: Not very hard  . Food insecurity - worry: Never true  . Food insecurity - inability: Never true  . Transportation needs - medical: No  . Transportation needs - non-medical: No  Occupational History  . Occupation: Retired  Tobacco Use  . Smoking status: Never Smoker  . Smokeless tobacco: Never Used  Substance and Sexual Activity  . Alcohol use: Yes    Comment: occasionally 1 glass of wine  . Drug use: No  . Sexual activity: Not on file  Other Topics Concern  . Not on file  Social History Narrative  . Not on file    Past Medical  History:  Diagnosis Date  . Diabetes mellitus without complication ([place])   . Hypercholesterolemia   . Hypertension      Patient Active Problem List   Diagnosis Date Noted  . Low back pain 07/11/2015  . Acute blood loss anemia 01/25/2015  . Chronic kidney disease (CKD), stage III (moderate) ([place]) 07/31/2014  . Dysfunction of eustachian tube 05/30/2014  . Acid reflux 05/30/2014  . Burning or prickling sensation 05/30/2014  . Deficiency of vitamin B 07/05/2008  . Anemia, deficiency 06/26/2008  . Avitaminosis D 06/26/2008  . Benign hypertension with CKD (chronic kidney disease) stage III ([place]) 06/15/2008  . Hypercholesteremia 06/12/2008  . Diabetes mellitus without complication ([place]) 81/01/7508  . Colon, diverticulosis 06/06/2008  . Apnea, sleep 01/26/2007  . History of colon polyps 08/09/2006    Past Surgical History:  Procedure Laterality Date  . COLONOSCOPY WITH PROPOFOL N/A 01/27/2015   Procedure: COLONOSCOPY WITH PROPOFOL;  Surgeon: Josefine Class, MD;  Location: [facility] ENDOSCOPY;  Service: Endoscopy;  Laterality: N/A;  . EYE SURGERY Left 01/21/2009    cataract surgery   . EYE SURGERY Right 04/08/2009   cataract surgery  . REPLACEMENT TOTAL KNEE Left 10/08/2009   Dr. Marry Guan    Her family history includes Breast cancer (age of onset: 56) in her other; Breast cancer (age of onset: 14) in her sister; Breast cancer (age of onset: 15) in her maternal aunt; Cancer in her sister; Congestive Heart Failure in her sister and sister; Leukemia in her father; Lung cancer in her brother; Parkinsonism in her mother.  Current Outpatient Medications:  .  amLODipine (NORVASC) 5 MG tablet, Take 1 tablet (5 mg total) by mouth daily., Disp: 90 tablet, Rfl: 3 .  benazepril (LOTENSIN) 20 MG tablet, TAKE 1 TABLET TWICE DAILY, Disp: 180 tablet, Rfl: 1 .  Blood Glucose Monitoring Suppl (ONE TOUCH ULTRA SYSTEM KIT) w/Device KIT, [place] blood sugar once per day, Disp: 1 each, Rfl: 0 .   cholecalciferol (VITAMIN D) 1000 UNITS tablet, Take 1,000 Units by mouth daily. , Disp: , Rfl:  .  cyanocobalamin 1000 MCG tablet, Take 1,000 mcg by mouth daily. , Disp: , Rfl:  .  ferrous sulfate 325 (65 FE) MG tablet, TAKE 1 TABLET (325 MG TOTAL) BY MOUTH DAILY., Disp: 90 tablet, Rfl: 1 .  glucose blood test strip, Check blood sugar once daily, Disp: 100 each, Rfl: 4 .  hydrochlorothiazide (HYDRODIURIL) 50 MG tablet, TAKE 1 TABLET EVERY DAY (Patient taking differently: TAKE 1 TABLET EVERY DAY (25 mg?)), Disp: 90 tablet, Rfl: 1 .  Lancets (ONETOUCH ULTRASOFT) lancets, Check blood sugar once daily, Disp: 100 each, Rfl: 3 .  metFORMIN (GLUCOPHAGE) 1000 MG tablet, TAKE 1 TABLET TWICE DAILY WITH MEALS, Disp: 180 tablet, Rfl: 0 .  omeprazole (PRILOSEC) 40 MG capsule, TAKE 1 CAPSULE EVERY DAY, Disp: 90 capsule, Rfl: 0 .  pravastatin (PRAVACHOL) 10 MG tablet, Take 1 tablet (10 mg total) by mouth at bedtime., Disp: 90 tablet, Rfl: 1  Patient Care Team: Mar Daring, PA-C as PCP - General (Family Medicine) Birder Robson, MD as Referring Physician (Ophthalmology)     Objective:   Vitals: BP 120/80 (BP Location: Left Arm, Patient Position: Sitting, Cuff Size: Large)   Pulse 73   Temp 98.1 F (36.7 C) (Oral)   Resp 16   Ht _0  (1.575 m)   Wt 200 lb (90.7 kg)   SpO2 99%   BMI 36.58 kg/m   Physical Exam  Constitutional: She is oriented to person, place, and time. She appears well-developed and well-nourished. No distress.  HENT:  Head: Normocephalic and atraumatic.  Right Ear: Hearing, tympanic membrane, external ear and ear canal normal.  Left Ear: Hearing, tympanic membrane, external ear and ear canal normal.  Nose: Nose normal.  Mouth/Throat: Uvula is midline, oropharynx is clear and moist and mucous membranes are normal. No oropharyngeal exudate.  Eyes: Conjunctivae and EOM are normal. Pupils are equal, round, and reactive to light. Right eye exhibits no discharge. Left eye  exhibits no discharge. No scleral icterus.  Neck: Normal range of motion. Neck supple. No JVD present. Carotid bruit is not present. No tracheal deviation present. No thyromegaly present.  Cardiovascular: Normal rate, regular rhythm, normal heart sounds and intact distal pulses. Exam reveals no gallop and no friction rub.  No murmur heard. Pulmonary/Chest: Effort normal and breath sounds normal. No respiratory distress. She has no wheezes. She has no rales. She exhibits no tenderness.  Abdominal: Soft. Bowel sounds are normal. She exhibits mass. She exhibits no distension. There is no tenderness. There is no rebound and no guarding.    Musculoskeletal: Normal range of motion. She exhibits no edema or tenderness.  Lymphadenopathy:    She has no cervical adenopathy.  Neurological: She is alert and oriented to person, place, and time.  Skin: Skin is warm and dry. No rash noted. She is not diaphoretic.  Psychiatric: She has a normal mood and affect. Her behavior is normal. Judgment and thought content normal.  Vitals reviewed.  Diabetic Foot Exam - Simple  Simple Foot Form Diabetic Foot exam was performed with the following findings:  Yes 03/03/2017 10:11 AM  Visual Inspection No deformities, no ulcerations, no other skin breakdown bilaterally:  Yes Sensation Testing Intact to touch and monofilament testing bilaterally:  Yes Pulse Check Posterior Tibialis and Dorsalis pulse intact bilaterally:  Yes Comments     Activities of Daily Living In your present state of health, do you have any difficulty performing the following activities: 02/24/2017  Hearing? Y  Comment Slight trouble hearing- pt has seen an audiologist for this.   Vision? N  Difficulty concentrating or making decisions? N  Walking or climbing stairs? Y  Comment Due to knee pain.  Dressing or bathing? N  Doing errands, shopping? N  Preparing Food and eating ? N  Using the Toilet? N  In the past six months, have you  accidently leaked urine? Y  Comment Occasionally, does not wear protection.  Do you have problems with loss of bowel control? N  Managing your Medications? N  Managing your Finances? N  Housekeeping or managing your Housekeeping? N  Some recent data might be hidden    Fall Risk Assessment Fall Risk  02/24/2017 02/02/2016 02/07/2015 07/31/2014  Falls in the past year? Yes No No No  Number falls in past yr: 1 - - -  Injury with Fall? No - - -  Follow up Falls prevention discussed - - -     Depression Screen PHQ 2/9 Scores 02/24/2017 02/24/2017 02/18/2016 02/02/2016  PHQ - 2 Score 0 0 0 0  PHQ- 9 Score 3 - - -    Cognitive Testing - 6-CIT  Correct? Score   What year is it? yes 0 0 or 4  What month is it? yes 0 0 or 3  Memorize:    Pia Mau,  43,  [address],  [place],      What time is it? (within 1 hour) yes 0 0 or 3  Count backwards from 20 yes 0 0, 2, or 4  Name the months of the year yes 0 0, 2, or 4  Repeat name & address above yes 0 0, 2, 4, 6, 8, or 10       TOTAL SCORE  0/28   Interpretation:  Normal  Normal (0-7) Abnormal (8-28)       Assessment & Plan:    Annual Physical Reviewed patient's Family Medical History Reviewed and updated list of patient's medical providers Assessment of cognitive impairment was done Assessed patient's functional ability Established a written schedule for health screening services Health Risk Assessment Completed and Reviewed  Exercise Activities and Dietary recommendations Goals    . DIET - REDUCE CARBOHYDRATE INTAKE     Recommend decreasing amount of carbohydrates consumed in daily diet.        Immunization History  Administered Date(s) Administered  . Pneumococcal Conjugate-13 10/03/2013  . Pneumococcal Polysaccharide-23 03/25/2011    Health Maintenance  Topic Date Due  . TETANUS/TDAP  08/25/1960  . FOOT EXAM  02/07/2016  . HEMOGLOBIN A1C  03/03/2017  . INFLUENZA VACCINE  06/24/2017 (Originally 08/25/2016)  .  OPHTHALMOLOGY EXAM  02/21/2018  . COLONOSCOPY  01/26/2025  . DEXA SCAN  Completed  . PNA vac Low Risk Adult  Completed     Discussed health benefits of physical activity, and encouraged her to engage in regular exercise appropriate for her age and condition.    1. Annual physical exam Normal physical exam today. Will check labs as below and  f/u pending lab results. If labs are stable and WNL she will not need to have these rechecked for one year at her next annual physical exam. She is to call the office in the meantime if she has any acute issue, questions or concerns.  2. Essential hypertension Stable. Continue current medical treatment plan with amlodipine 57m, benazepril 220m HCTZ 2537m Will check labs as below and f/u pending results. - Comprehensive metabolic panel - TSH - amLODipine (NORVASC) 5 MG tablet; Take 1 tablet (5 mg total) by mouth daily.  Dispense: 90 tablet; Refill: 3  3. Type 2 diabetes mellitus with microalbuminuria, without long-term current use of insulin (HCC) Stable on metformin 1000m56mD. Will check labs as below and f/u pending results. - Hemoglobin A1c - TSH  4. Chronic kidney disease (CKD), stage III (moderate) (H[facility]) Stable. On Benazepril. Will check labs as below and f/u pending results. - Comprehensive metabolic panel  5. Hypercholesteremia Stable. Not taking pravastatin currently due to myalgias per patient but will restart if cholesterol high. Will check labs as below and f/u pending results. - Lipid Panel With LDL/HDL Ratio - TSH  6. Anemia, deficiency Will check labs as below and f/u pending results. - CBC with Differential/Platelet  7. Avitaminosis D On 1000 IU daily. Will check labs as below and f/u pending results. - VITAMIN D 25 Hydroxy (Vit-D Deficiency, Fractures)  8. Deficiency of vitamin B On 1000 mcg daily. Will check labs as below and f/u pending results. - Vitamin B12  9. Right upper quadrant abdominal mass Was seen in  August and felt to be diastasis recti. Mass has become larger per patient and is now visible with patient standing. Will get US a[place] to further evaluate.  - [place] [employer]; Future  10. Breast cancer screening There is family history of breast cancer. She does perform regular self breast exams. Mammogram was ordered as below. [facility] so she may schedule her mammogram at her convenience. - MM Digital Screening; Future  ------------------------------------------------------------------------------------------------------------    JennMar Daring-C  [facility]

## 2017-03-04 LAB — HEMOGLOBIN A1C
Est. average glucose Bld gHb Est-mCnc: 134 mg/dL
Hgb A1c MFr Bld: 6.3 % — ABNORMAL HIGH (ref 4.8–5.6)

## 2017-03-04 LAB — LIPID PANEL WITH LDL/HDL RATIO
CHOLESTEROL TOTAL: 285 mg/dL — AB (ref 100–199)
HDL: 77 mg/dL (ref >39)
LDL CALC: 193 mg/dL — AB (ref 0–99)
LDL/HDL RATIO: 2.5 ratio (ref 0.0–3.2)
Triglycerides: 74 mg/dL (ref 0–149)
VLDL CHOLESTEROL CAL: 15 mg/dL (ref 5–40)

## 2017-03-04 LAB — COMPREHENSIVE METABOLIC PANEL
A/G RATIO: 1.2 (ref 1.2–2.2)
ALT: 7 IU/L (ref 0–32)
AST: 10 IU/L (ref 0–40)
Albumin: 3.9 g/dL (ref 3.5–4.8)
Alkaline Phosphatase: 54 IU/L (ref 39–117)
BILIRUBIN TOTAL: 0.2 mg/dL (ref 0.0–1.2)
BUN / CREAT RATIO: 20 (ref 12–28)
BUN: 24 mg/dL (ref 8–27)
CALCIUM: 9.7 mg/dL (ref 8.7–10.3)
CHLORIDE: 102 mmol/L (ref 96–106)
CO2: 22 mmol/L (ref 20–29)
Creatinine, Ser: 1.22 mg/dL — ABNORMAL HIGH (ref 0.57–1.00)
GFR, EST AFRICAN AMERICAN: 50 mL/min/{1.73_m2} — AB (ref >59)
GFR, EST NON AFRICAN AMERICAN: 43 mL/min/{1.73_m2} — AB (ref >59)
GLOBULIN, TOTAL: 3.2 g/dL (ref 1.5–4.5)
Glucose: 86 mg/dL (ref 65–99)
POTASSIUM: 4.6 mmol/L (ref 3.5–5.2)
SODIUM: 140 mmol/L (ref 134–144)
TOTAL PROTEIN: 7.1 g/dL (ref 6.0–8.5)

## 2017-03-04 LAB — CBC WITH DIFFERENTIAL/PLATELET
BASOS: 0 %
Basophils Absolute: 0 10*3/uL (ref 0.0–0.2)
EOS (ABSOLUTE): 0.1 10*3/uL (ref 0.0–0.4)
EOS: 2 %
HEMATOCRIT: 32.4 % — AB (ref 34.0–46.6)
Hemoglobin: 10.7 g/dL — ABNORMAL LOW (ref 11.1–15.9)
IMMATURE GRANS (ABS): 0 10*3/uL (ref 0.0–0.1)
Immature Granulocytes: 0 %
LYMPHS: 25 %
Lymphocytes Absolute: 1.2 10*3/uL (ref 0.7–3.1)
MCH: 28.9 pg (ref 26.6–33.0)
MCHC: 33 g/dL (ref 31.5–35.7)
MCV: 88 fL (ref 79–97)
Monocytes Absolute: 0.5 10*3/uL (ref 0.1–0.9)
Monocytes: 10 %
NEUTROS ABS: 3 10*3/uL (ref 1.4–7.0)
Neutrophils: 63 %
PLATELETS: 313 10*3/uL (ref 150–379)
RBC: 3.7 x10E6/uL — ABNORMAL LOW (ref 3.77–5.28)
RDW: 14.1 % (ref 12.3–15.4)
WBC: 4.8 10*3/uL (ref 3.4–10.8)

## 2017-03-04 LAB — TSH: TSH: 1.32 u[IU]/mL (ref 0.450–4.500)

## 2017-03-04 LAB — VITAMIN D 25 HYDROXY (VIT D DEFICIENCY, FRACTURES): Vit D, 25-Hydroxy: 33.6 ng/mL (ref 30.0–100.0)

## 2017-03-04 LAB — VITAMIN B12: VITAMIN B 12: 455 pg/mL (ref 232–1245)

## 2017-03-07 ENCOUNTER — Ambulatory Visit
Admission: RE | Admit: 2017-03-07 | Discharge: 2017-03-07 | Disposition: A | Discharge status: Home / Self Care | Payer: Medicare HMO | Source: Ambulatory Visit | Attending: Physician Assistant | Admitting: Physician Assistant

## 2017-03-07 ENCOUNTER — Telehealth: Payer: Self-pay

## 2017-03-07 DIAGNOSIS — R935 Abnormal findings on diagnostic imaging of other abdominal regions, including retroperitoneum: Secondary | ICD-10-CM | POA: Insufficient documentation

## 2017-03-07 DIAGNOSIS — R1901 Right upper quadrant abdominal swelling, mass and lump: Secondary | ICD-10-CM | POA: Diagnosis not present

## 2017-03-07 DIAGNOSIS — R1900 Intra-abdominal and pelvic swelling, mass and lump, unspecified site: Secondary | ICD-10-CM | POA: Diagnosis not present

## 2017-03-07 NOTE — Telephone Encounter (Signed)
Patient advised as below.  

## 2017-03-07 NOTE — Telephone Encounter (Signed)
-----   Message from Mar Daring, [place] sent at 03/04/2017 11:37 AM EST ----- HgB at 10.7 which is borderline low but stable for her. Kidney function declined but stable and unchanged essentially over last 2 years. A1c is borderline elevated compared to previously but still controlled at 6.3. Thyroid normal. B12 normal. Vit D normal. Cholesterol is still elevated but better than last year. Your 10 yr risk for having a cardiovascular event (heart attack, stroke, etc) is elevated at 43.8%. I would recommend continuing your pravastatin for heart health protection.

## 2017-03-07 NOTE — Telephone Encounter (Signed)
-----   Message from Mar Daring, [place] sent at 03/07/2017 10:02 AM EST ----- Mass does appear to be most likely a fat containing hernia, no bowel noted in hernia on [place]. If patient desires repair I can refer her to general surgery for consideration.

## 2017-03-17 ENCOUNTER — Other Ambulatory Visit: Payer: Self-pay | Admitting: Family Medicine

## 2017-03-17 DIAGNOSIS — K219 Gastro-esophageal reflux disease without esophagitis: Secondary | ICD-10-CM

## 2017-03-17 NOTE — Telephone Encounter (Signed)
Miranda Olsen, I think this is your patient Thanks

## 2017-03-22 ENCOUNTER — Ambulatory Visit
Admission: RE | Admit: 2017-03-22 | Discharge: 2017-03-22 | Disposition: A | Discharge status: Home / Self Care | Payer: Medicare HMO | Source: Ambulatory Visit | Attending: Physician Assistant | Admitting: Physician Assistant

## 2017-03-22 DIAGNOSIS — Z1239 Encounter for other screening for malignant neoplasm of breast: Secondary | ICD-10-CM

## 2017-03-22 DIAGNOSIS — Z1231 Encounter for screening mammogram for malignant neoplasm of breast: Secondary | ICD-10-CM | POA: Diagnosis not present

## 2017-03-28 ENCOUNTER — Other Ambulatory Visit: Payer: Self-pay | Admitting: Physician Assistant

## 2017-03-28 DIAGNOSIS — D539 Nutritional anemia, unspecified: Secondary | ICD-10-CM

## 2017-04-04 ENCOUNTER — Other Ambulatory Visit: Payer: Self-pay | Admitting: Physician Assistant

## 2017-04-04 DIAGNOSIS — I129 Hypertensive chronic kidney disease with stage 1 through stage 4 chronic kidney disease, or unspecified chronic kidney disease: Secondary | ICD-10-CM

## 2017-04-04 DIAGNOSIS — N182 Chronic kidney disease, stage 2 (mild): Principal | ICD-10-CM

## 2017-05-05 DIAGNOSIS — H02834 Dermatochalasis of left upper eyelid: Secondary | ICD-10-CM | POA: Diagnosis not present

## 2017-05-05 DIAGNOSIS — H02831 Dermatochalasis of right upper eyelid: Secondary | ICD-10-CM | POA: Diagnosis not present

## 2017-05-11 ENCOUNTER — Other Ambulatory Visit: Payer: Self-pay | Admitting: Physician Assistant

## 2017-05-11 DIAGNOSIS — I129 Hypertensive chronic kidney disease with stage 1 through stage 4 chronic kidney disease, or unspecified chronic kidney disease: Secondary | ICD-10-CM

## 2017-05-11 DIAGNOSIS — N182 Chronic kidney disease, stage 2 (mild): Principal | ICD-10-CM

## 2017-07-18 ENCOUNTER — Telehealth: Payer: Self-pay | Admitting: *Deleted

## 2017-07-18 ENCOUNTER — Encounter: Payer: Self-pay | Admitting: Physician Assistant

## 2017-07-18 ENCOUNTER — Ambulatory Visit (INDEPENDENT_AMBULATORY_CARE_PROVIDER_SITE_OTHER): Payer: Medicare HMO | Admitting: Physician Assistant

## 2017-07-18 VITALS — BP 130/80 | HR 91 | Temp 98.5°F | Resp 16 | Wt 197.2 lb

## 2017-07-18 DIAGNOSIS — K0889 Other specified disorders of teeth and supporting structures: Secondary | ICD-10-CM | POA: Diagnosis not present

## 2017-07-18 DIAGNOSIS — K0510 Chronic gingivitis, plaque induced: Secondary | ICD-10-CM | POA: Diagnosis not present

## 2017-07-18 MED ORDER — HYDROCODONE-ACETAMINOPHEN 5-325 MG PO TABS: 1.0000 | ORAL_TABLET | Freq: Three times a day (TID) | ORAL | 0 refills | Status: DC | PRN

## 2017-07-18 MED ORDER — BENZOCAINE 10 % MT GEL: 1.0000 "application " | OROMUCOSAL | 0 refills | Status: DC | PRN

## 2017-07-18 NOTE — Patient Instructions (Signed)
Dental Pain Dental pain may be caused by many things, including:  Tooth decay (cavities or caries). Cavities expose the nerve of your tooth to air and hot or cold temperatures. This can cause pain or discomfort.  Abscess or infection. A dental abscess is a collection of infected pus from a bacterial infection in the inner part of the tooth (pulp). It usually occurs at the end of the tooth's root.  Injury.  An unknown reason (idiopathic).  Your pain may be mild or severe. It may only occur when:  You are chewing.  You are exposed to hot or cold temperature.  You are eating or drinking sugary foods or beverages, such as soda or candy.  Your pain may also be constant. Follow these instructions at home: Watch your dental pain for any changes. The following actions may help to lessen any discomfort that you are feeling:  Take medicines only as directed by your dentist.  If you were prescribed an antibiotic medicine, finish all of it even if you start to feel better.  Keep all follow-up visits as directed by your dentist. This is important.  Do not apply heat to the outside of your face.  Rinse your mouth or gargle with salt water if directed by your dentist. This helps with pain and swelling. ? You can make salt water by adding  tsp of salt to 1 cup of warm water.  Apply ice to the painful area of your face: ? Put ice in a plastic bag. ? Place a towel between your skin and the bag. ? Leave the ice on for 20 minutes, 2-3 times per day.  Avoid foods or drinks that cause you pain, such as: ? Very hot or very cold foods or drinks. ? Sweet or sugary foods or drinks.  Contact a health care provider if:  Your pain is not controlled with medicines.  Your symptoms are worse.  You have new symptoms. Get help right away if:  You are unable to open your mouth.  You are having trouble breathing or swallowing.  You have a fever.  Your face, neck, or jaw is swollen. This  information is not intended to replace advice given to you by your health care provider. Make sure you discuss any questions you have with your health care provider. Document Released: 01/11/2005 Document Revised: 05/22/2015 Document Reviewed: 01/07/2014 Elsevier Interactive Patient Education  [employer].

## 2017-07-18 NOTE — Telephone Encounter (Signed)
Patient called office for appt for right ear and jaw pain. Patient states she has been having pain for 1 week. Patient states she is having a lot of pain when she eats, in her jaw that runs up to her right ear. Patient also states her tongue feels swollen on the right side. Patient states she has a lot of sinus drainage and congestion. Patient is scheduled to see Jennie at 1:20 pm.

## 2017-07-18 NOTE — Progress Notes (Signed)
Patient: Miranda Olsen Female    DOB: 03-09-1940   77 y.o.   MRN: [id number] Visit Date: 07/18/2017  Today's Provider: Mar Daring, PA-C   Chief Complaint  Patient presents with  . Otalgia   Subjective:    HPI Patient here today with c/o right ear and jaw pain,right side. Patient states she has been having pain for 1 week. Patient states she is having a lot of pain when she eats, in her jaw that runs up to her right ear. Patient also states her tongue feels swollen on the right side. Patient states she has a lot of sinus drainage and congestion. She states she can't chew.     No Known Allergies   Current Outpatient Medications:  .  amLODipine (NORVASC) 5 MG tablet, Take 1 tablet (5 mg total) by mouth daily., Disp: 90 tablet, Rfl: 3 .  benazepril (LOTENSIN) 20 MG tablet, TAKE 1 TABLET TWICE DAILY, Disp: 180 tablet, Rfl: 1 .  Blood Glucose Monitoring Suppl (ONE TOUCH ULTRA SYSTEM KIT) w/Device KIT, [place] blood sugar once per day, Disp: 1 each, Rfl: 0 .  cholecalciferol (VITAMIN D) 1000 UNITS tablet, Take 1,000 Units by mouth daily. , Disp: , Rfl:  .  cyanocobalamin 1000 MCG tablet, Take 1,000 mcg by mouth daily. , Disp: , Rfl:  .  ferrous sulfate 325 (65 FE) MG tablet, TAKE 1 TABLET (325 MG TOTAL) BY MOUTH DAILY., Disp: 90 tablet, Rfl: 1 .  glucose blood test strip, Check blood sugar once daily, Disp: 100 each, Rfl: 4 .  hydrochlorothiazide (HYDRODIURIL) 50 MG tablet, TAKE 1 TABLET EVERY DAY, Disp: 90 tablet, Rfl: 1 .  Lancets (ONETOUCH ULTRASOFT) lancets, Check blood sugar once daily, Disp: 100 each, Rfl: 3 .  metFORMIN (GLUCOPHAGE) 1000 MG tablet, TAKE 1 TABLET TWICE DAILY WITH MEALS, Disp: 180 tablet, Rfl: 0 .  omeprazole (PRILOSEC) 40 MG capsule, TAKE 1 CAPSULE EVERY DAY, Disp: 90 capsule, Rfl: 1 .  pravastatin (PRAVACHOL) 10 MG tablet, Take 1 tablet (10 mg total) by mouth at bedtime., Disp: 90 tablet, Rfl: 1  Review of Systems  Constitutional: Positive for  fatigue.  HENT: Positive for dental problem and ear pain (Right ear).   Respiratory: Negative.   Cardiovascular: Positive for leg swelling ("ankle swelling all the time" probably from her Amlodipine.). Negative for chest pain and palpitations.  Gastrointestinal: Negative.     Social History   Tobacco Use  . Smoking status: Never Smoker  . Smokeless tobacco: Never Used  Substance Use Topics  . Alcohol use: Yes    Comment: occasionally 1 glass of wine   Objective:   BP 130/80 (BP Location: Left Arm, Patient Position: Sitting, Cuff Size: Normal)   Pulse 91   Temp 98.5 F (36.9 C) (Oral)   Resp 16   Wt 197 lb 3.2 oz (89.4 kg)   SpO2 98%   BMI 36.07 kg/m    Physical Exam  Constitutional: She appears well-developed and well-nourished. No distress.  HENT:  Head: Normocephalic and atraumatic.  Right Ear: Hearing, tympanic membrane, external ear and ear canal normal.  Left Ear: Hearing, tympanic membrane, external ear and ear canal normal.  Nose: Nose normal.  Mouth/Throat: Uvula is midline, oropharynx is clear and moist and mucous membranes are normal. Abnormal dentition. Dental caries present. No oropharyngeal exudate.  Does have teeth missing.  On last molar on right side she has exposed tooth nerve root visible. This is most likely source of  sensitivity and pain  Eyes: Pupils are equal, round, and reactive to light. Conjunctivae are normal. Right eye exhibits no discharge. Left eye exhibits no discharge. No scleral icterus.  Neck: Normal range of motion. Neck supple. No tracheal deviation present. No thyromegaly present.  Cardiovascular: Normal rate, regular rhythm and normal heart sounds. Exam reveals no gallop and no friction rub.  No murmur heard. Pulmonary/Chest: Effort normal and breath sounds normal. No stridor. No respiratory distress. She has no wheezes. She has no rales.  Lymphadenopathy:    She has no cervical adenopathy.  Skin: Skin is warm and dry. She is not  diaphoretic.  Vitals reviewed.      Assessment & Plan:     1. Pain, dental Will give oragel as below for more frequent use and hydrocodone-apap for severe pain. Advised of drowsiness precautions and to not drive while taking this medication. She agrees. Referral to [facility] program to help her establish a dental home. No dental care recently. Poor dental health.  - benzocaine (ORAJEL) 10 % mucosal gel; Use as directed 1 application in the mouth or throat as needed for mouth pain.  Dispense: 5.3 g; Refill: 0 - HYDROcodone-acetaminophen (NORCO/VICODIN) 5-325 MG tablet; Take 1 tablet by mouth every 8 (eight) hours as needed for moderate pain.  Dispense: 30 tablet; Refill: 0 - [facility]ulatory referral to [facility]  2. Gingivitis Receding gums. See above medical treatment plan. - Ambulatory referral to [place], PA-C  [facility]

## 2017-07-18 NOTE — Telephone Encounter (Signed)
Agreed -

## 2017-07-22 ENCOUNTER — Telehealth: Payer: Self-pay

## 2017-07-22 NOTE — Telephone Encounter (Signed)
This was sent to [facility] team. Can you let them know,Thanks

## 2017-07-22 NOTE — Telephone Encounter (Signed)
Patient called saying that she has not heard anything about her referral and she is still in a lot a pain. Patient is wanting to know the status of her referral. Please advise.

## 2017-07-25 NOTE — Telephone Encounter (Signed)
Voicemail left on Amy's voicemail from [facility] team to advise of referral

## 2017-07-27 ENCOUNTER — Telehealth: Payer: Self-pay | Admitting: Physician Assistant

## 2017-07-27 NOTE — Telephone Encounter (Signed)
Amy from [facility] team called stating that appointment has been set up for pt to see a dentist

## 2017-07-27 NOTE — Telephone Encounter (Signed)
Patient has appt at dental office for July 5th to have teeth cleaned and see what would be recommended.  She has dental coverage with [employer] and office was referred by [email or web address]  Should she need extraction then it may not be covered by plan as plan is for preventative measures but office willing to see what might be going on.

## 2017-07-29 NOTE — Telephone Encounter (Signed)
Awesome! Thanks

## 2017-08-02 ENCOUNTER — Other Ambulatory Visit: Payer: Self-pay | Admitting: Physician Assistant

## 2017-08-02 DIAGNOSIS — K219 Gastro-esophageal reflux disease without esophagitis: Secondary | ICD-10-CM

## 2017-08-24 ENCOUNTER — Other Ambulatory Visit: Payer: Self-pay | Admitting: Physician Assistant

## 2017-08-24 DIAGNOSIS — I129 Hypertensive chronic kidney disease with stage 1 through stage 4 chronic kidney disease, or unspecified chronic kidney disease: Secondary | ICD-10-CM

## 2017-08-24 DIAGNOSIS — N182 Chronic kidney disease, stage 2 (mild): Principal | ICD-10-CM

## 2017-09-01 DIAGNOSIS — H02403 Unspecified ptosis of bilateral eyelids: Secondary | ICD-10-CM | POA: Diagnosis not present

## 2017-09-01 DIAGNOSIS — H02834 Dermatochalasis of left upper eyelid: Secondary | ICD-10-CM | POA: Diagnosis not present

## 2017-09-01 DIAGNOSIS — H02831 Dermatochalasis of right upper eyelid: Secondary | ICD-10-CM | POA: Diagnosis not present

## 2017-09-26 ENCOUNTER — Other Ambulatory Visit: Payer: Self-pay | Admitting: Physician Assistant

## 2017-09-26 DIAGNOSIS — N182 Chronic kidney disease, stage 2 (mild): Principal | ICD-10-CM

## 2017-09-26 DIAGNOSIS — I129 Hypertensive chronic kidney disease with stage 1 through stage 4 chronic kidney disease, or unspecified chronic kidney disease: Secondary | ICD-10-CM

## 2017-10-09 IMAGING — CR DG LUMBAR SPINE 2-3V
1 series · 3 of 3 positions shown · non-contrast
Comparison: No recent prior.

CLINICAL DATA: Back pain with radiation down right leg.

EXAM:
LUMBAR SPINE - 2-3 VIEW

[Series 1: dg lumbar spine 2-3 views · 0.14mm/px · 3 of 3 slices shown]
[im 1/3]
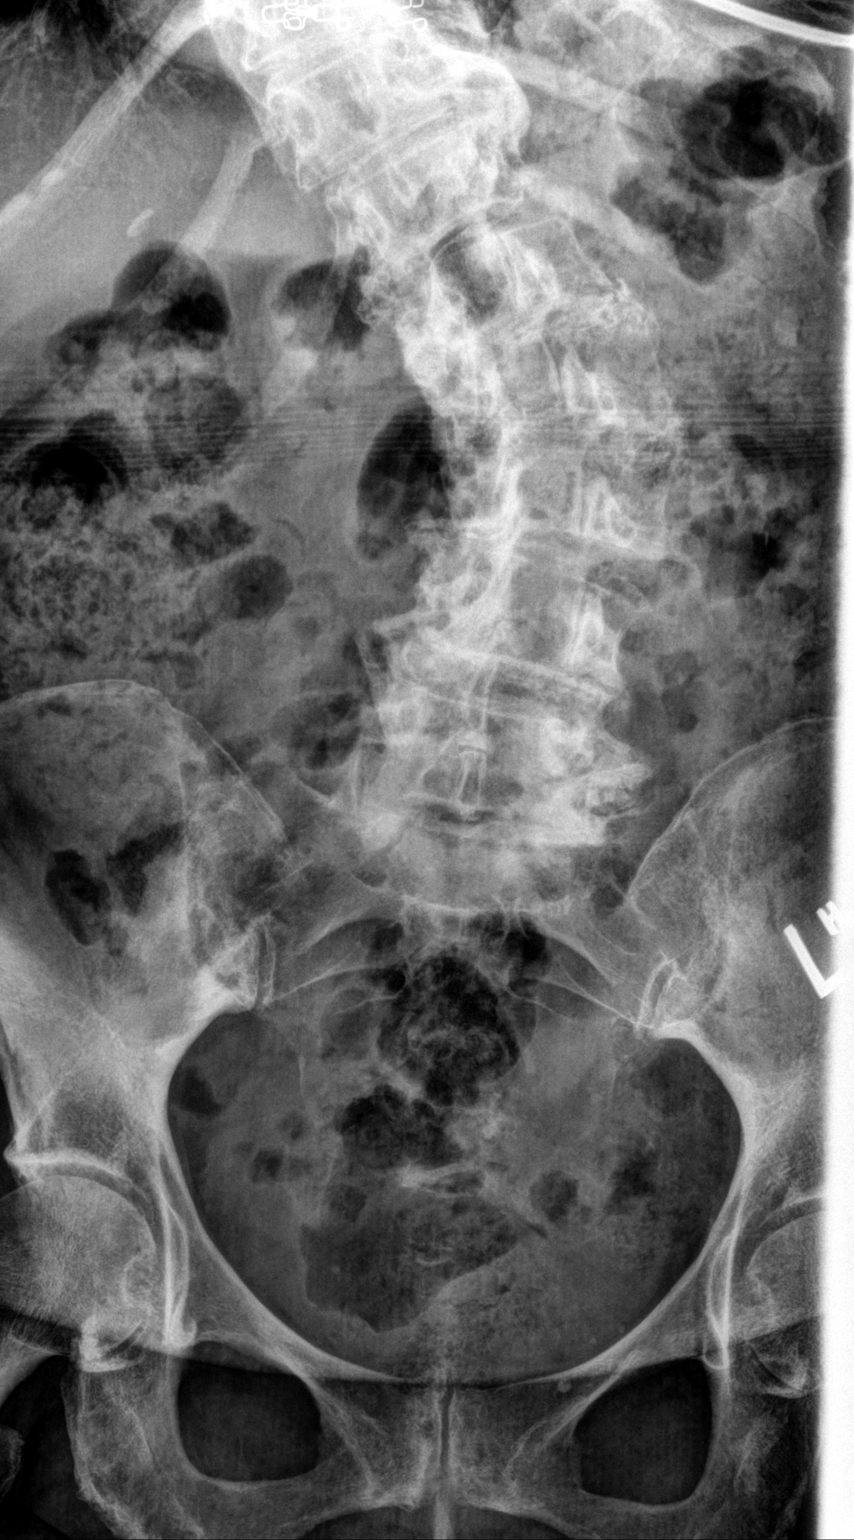
[im 2/3]
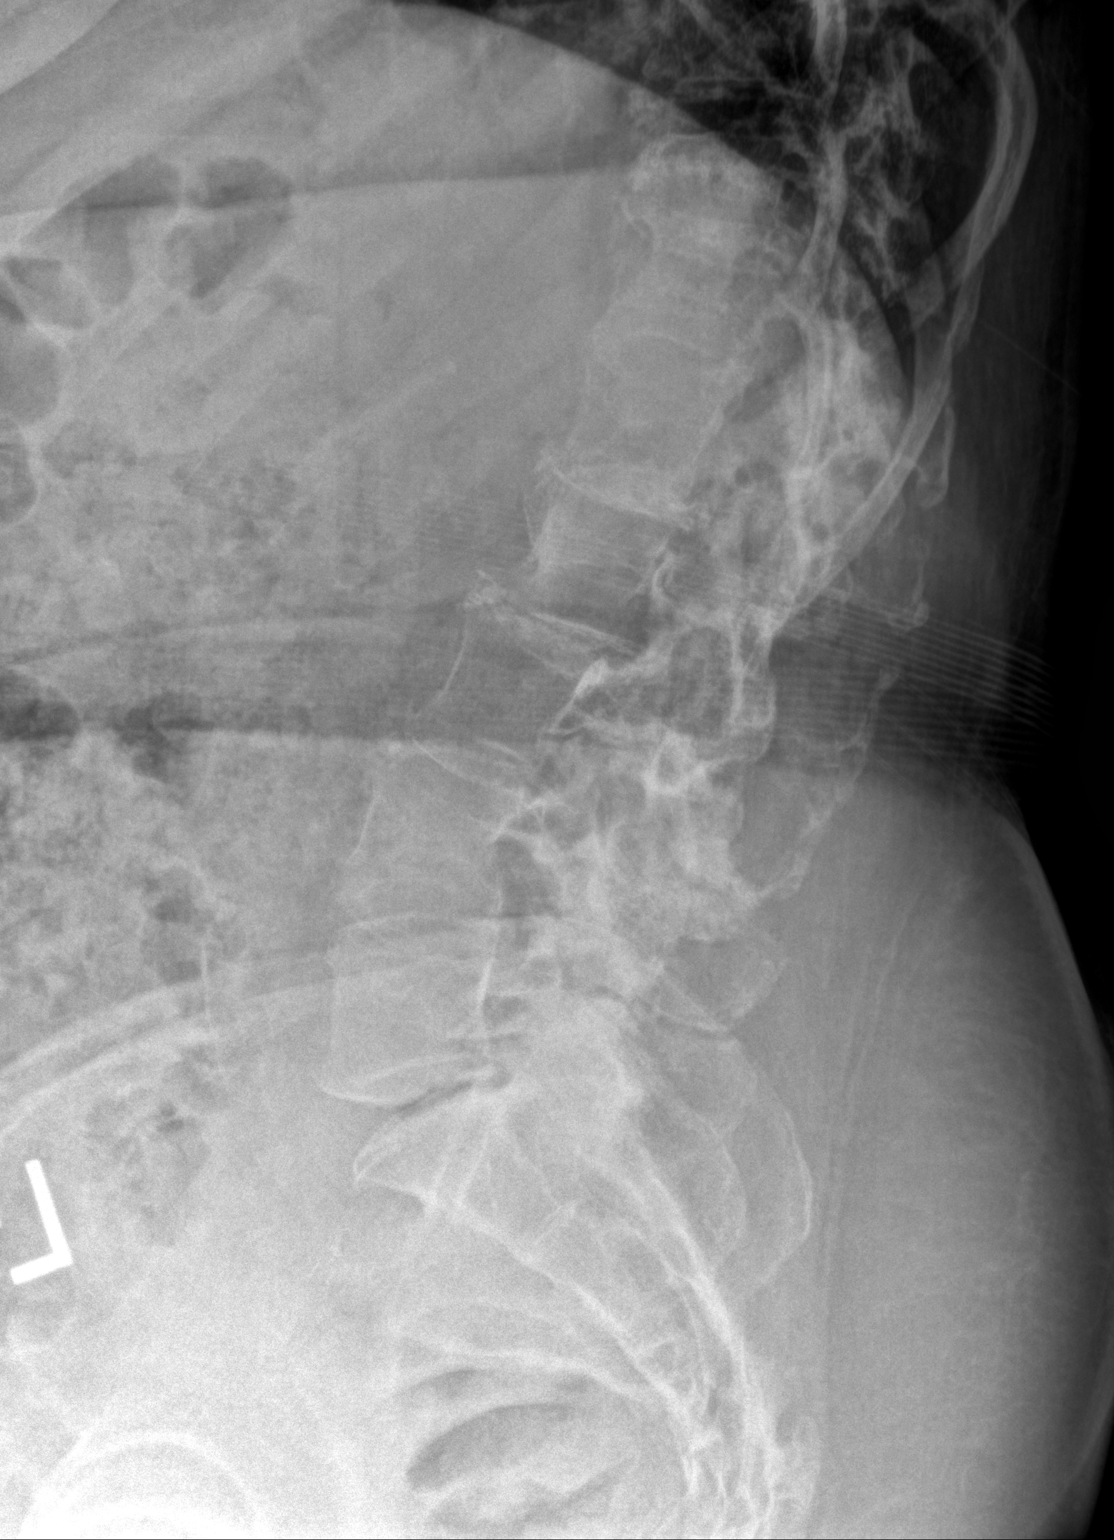
[im 3/3]
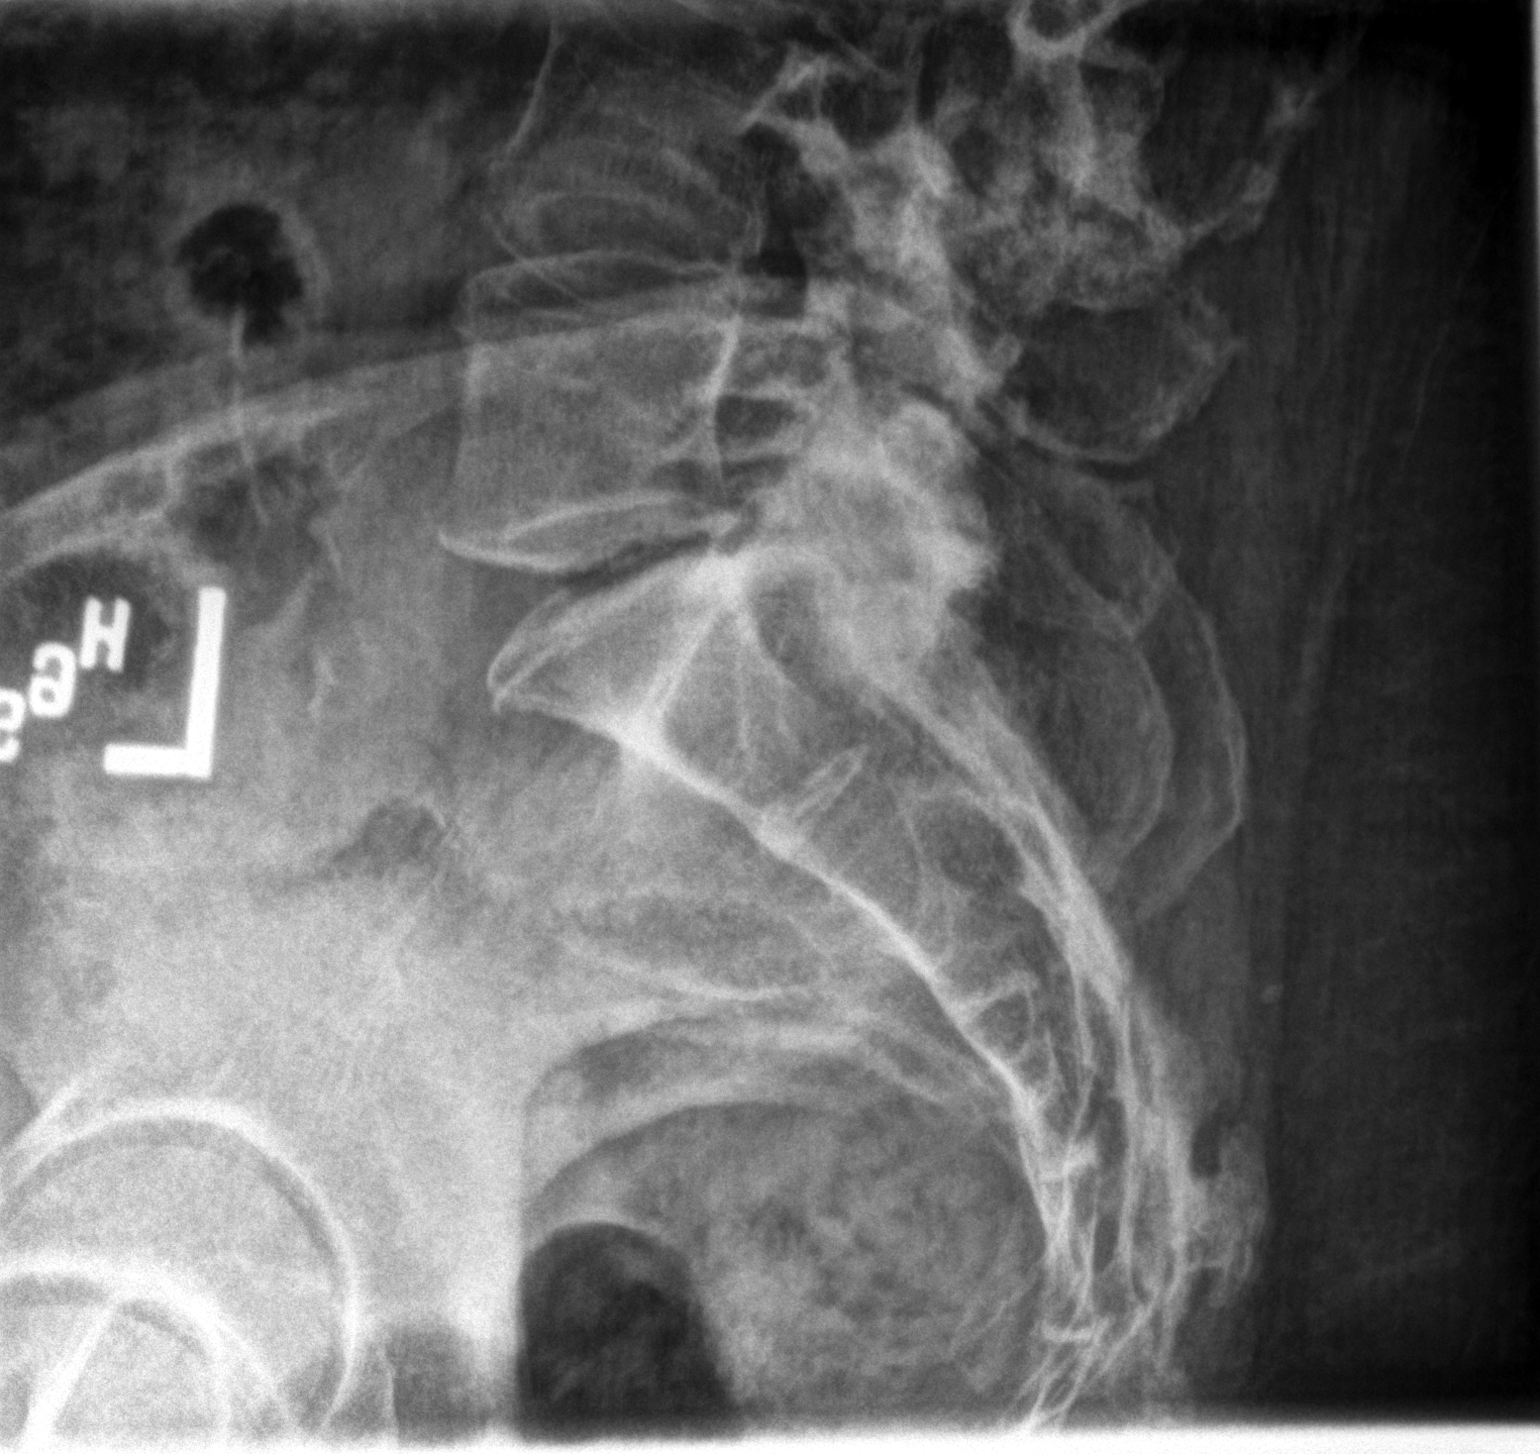

[3 of 3 positions shown; findings below may reference images not displayed]

FINDINGS: Severe lumbar scoliosis concave right. Diffuse severe degenerative
changes with multilevel disc degeneration endplate osteophyte
formation. No acute bony abnormality. Aortoiliac atherosclerotic
vascular calcification.
IMPRESSION: 1. Severe lumbar spine scoliosis concave right. Diffuse severe
multilevel degenerative change. No acute bony abnormality.

2. Aortoiliac atherosclerotic vascular disease.

## 2017-10-10 ENCOUNTER — Other Ambulatory Visit: Payer: Self-pay | Admitting: Physician Assistant

## 2017-10-10 DIAGNOSIS — D539 Nutritional anemia, unspecified: Secondary | ICD-10-CM

## 2017-10-20 ENCOUNTER — Other Ambulatory Visit: Payer: Self-pay | Admitting: Physician Assistant

## 2017-10-20 DIAGNOSIS — E119 Type 2 diabetes mellitus without complications: Secondary | ICD-10-CM

## 2017-10-24 ENCOUNTER — Other Ambulatory Visit: Payer: Self-pay | Admitting: Physician Assistant

## 2017-10-24 DIAGNOSIS — D539 Nutritional anemia, unspecified: Secondary | ICD-10-CM

## 2017-10-24 MED ORDER — FERROUS SULFATE 325 (65 FE) MG PO TABS: 325.0000 mg | ORAL_TABLET | Freq: Every day | ORAL | 1 refills | Status: DC

## 2017-10-24 NOTE — Telephone Encounter (Signed)
Pt needing refills on: ferrous sulfate 325 (65 FE) MG tablet  Please fill at: [id number] - [place], [place] - [address] [phone] (Phone) [phone] (Fax)   Thanks, [employer]

## 2017-10-31 DIAGNOSIS — M4120 Other idiopathic scoliosis, site unspecified: Secondary | ICD-10-CM | POA: Diagnosis not present

## 2017-11-02 ENCOUNTER — Ambulatory Visit (INDEPENDENT_AMBULATORY_CARE_PROVIDER_SITE_OTHER): Payer: Medicare HMO | Admitting: Physician Assistant

## 2017-11-02 ENCOUNTER — Encounter: Payer: Self-pay | Admitting: Physician Assistant

## 2017-11-02 VITALS — BP 158/88 | HR 77 | Temp 97.9°F | Resp 16 | Wt 197.6 lb

## 2017-11-02 DIAGNOSIS — E1129 Type 2 diabetes mellitus with other diabetic kidney complication: Secondary | ICD-10-CM | POA: Diagnosis not present

## 2017-11-02 DIAGNOSIS — Z2821 Immunization not carried out because of patient refusal: Secondary | ICD-10-CM | POA: Diagnosis not present

## 2017-11-02 DIAGNOSIS — I10 Essential (primary) hypertension: Secondary | ICD-10-CM | POA: Diagnosis not present

## 2017-11-02 DIAGNOSIS — D539 Nutritional anemia, unspecified: Secondary | ICD-10-CM | POA: Diagnosis not present

## 2017-11-02 DIAGNOSIS — R809 Proteinuria, unspecified: Secondary | ICD-10-CM

## 2017-11-02 DIAGNOSIS — E7800 Pure hypercholesterolemia, unspecified: Secondary | ICD-10-CM | POA: Diagnosis not present

## 2017-11-02 DIAGNOSIS — I129 Hypertensive chronic kidney disease with stage 1 through stage 4 chronic kidney disease, or unspecified chronic kidney disease: Secondary | ICD-10-CM

## 2017-11-02 DIAGNOSIS — N182 Chronic kidney disease, stage 2 (mild): Secondary | ICD-10-CM

## 2017-11-02 NOTE — Progress Notes (Signed)
Patient: Miranda Olsen Female    DOB: 1940/06/09   77 y.o.   MRN: [id number] Visit Date: 11/02/2017  Today's Provider: Mar Daring, PA-C   Chief Complaint  Patient presents with  . Follow-up    6 months T2DM,HTN,Cholesterol   Subjective:    HPI  Diabetes Mellitus Type II, Follow-up:   Lab Results  Component Value Date   HGBA1C 6.3 (H) 03/03/2017   HGBA1C 6.1 08/31/2016   HGBA1C 6.1 (H) 02/02/2016   Last seen for diabetes 7 months ago.  Management since then includes none. She reports excellent compliance with treatment. She is not having side effects. Current symptoms include none and have been stable. Home blood sugar records: not checking   Current Insulin Regimen: none Most Recent Eye Exam: 09/01/2017 and goes back 10/24 Weight trend: stable Current diet: in general, an "unhealthy" diet Current exercise: walking  ------------------------------------------------------------------------   Hypertension, follow-up:  BP Readings from Last 3 Encounters:  11/02/17 (!) 158/88  07/18/17 130/80  03/03/17 120/80    She was last seen for hypertension 7 months ago.  Management since that visit includes none.She reports excellent compliance with treatment. She is not having side effects.  She is not exercising.Only some walking She is not adherent to low salt diet.   Outside blood pressures are not checking. She is experiencing none. Reports that her ankles swelled sometimes. Patient denies chest pain, chest pressure/discomfort, exertional chest pressure/discomfort, fatigue, irregular heart beat and palpitations.   Cardiovascular risk factors include advanced age (older than 75 for men, 35 for women), diabetes mellitus, dyslipidemia and hypertension.   ------------------------------------------------------------------------    Lipid/Cholesterol, Follow-up:   Last seen for this 7 months ago.  Management since that visit includes none.Pt reports  that she takes the Pravastatin every other day.  Last Lipid Panel:    Component Value Date/Time   CHOL 285 (H) 03/03/2017 1013   TRIG 74 03/03/2017 1013   HDL 77 03/03/2017 1013   CHOLHDL 4.2 02/02/2016 0955   LDLCALC 193 (H) 03/03/2017 1013    She reports good compliance with treatment. She is not having side effects.   Wt Readings from Last 3 Encounters:  11/02/17 197 lb 9.6 oz (89.6 kg)  07/18/17 197 lb 3.2 oz (89.4 kg)  03/03/17 200 lb (90.7 kg)    ------------------------------------------------------------------------ Patient declined Influenza Vaccine.    No Known Allergies   Current Outpatient Medications:  .  amLODipine (NORVASC) 5 MG tablet, Take 1 tablet (5 mg total) by mouth daily., Disp: 90 tablet, Rfl: 3 .  benazepril (LOTENSIN) 20 MG tablet, TAKE 1 TABLET TWICE DAILY, Disp: 180 tablet, Rfl: 1 .  Blood Glucose Monitoring Suppl (ONE TOUCH ULTRA SYSTEM KIT) w/Device KIT, [place] blood sugar once per day, Disp: 1 each, Rfl: 0 .  cholecalciferol (VITAMIN D) 1000 UNITS tablet, Take 1,000 Units by mouth daily. , Disp: , Rfl:  .  cyanocobalamin 1000 MCG tablet, Take 1,000 mcg by mouth daily. , Disp: , Rfl:  .  ferrous sulfate 325 (65 FE) MG tablet, Take 1 tablet (325 mg total) by mouth daily., Disp: 90 tablet, Rfl: 1 .  glucose blood test strip, Check blood sugar once daily, Disp: 100 each, Rfl: 4 .  hydrochlorothiazide (HYDRODIURIL) 50 MG tablet, TAKE 1 TABLET EVERY DAY, Disp: 90 tablet, Rfl: 1 .  meloxicam (MOBIC) 7.5 MG tablet, , Disp: , Rfl:  .  metFORMIN (GLUCOPHAGE) 1000 MG tablet, TAKE 1 TABLET TWICE DAILY WITH MEALS,  Disp: 180 tablet, Rfl: 1 .  omeprazole (PRILOSEC) 40 MG capsule, TAKE 1 CAPSULE EVERY DAY, Disp: 90 capsule, Rfl: 1 .  pravastatin (PRAVACHOL) 10 MG tablet, Take 1 tablet (10 mg total) by mouth at bedtime. (Patient taking differently: Take 10 mg by mouth at bedtime. ), Disp: 90 tablet, Rfl: 1 .  HYDROcodone-acetaminophen (NORCO/VICODIN) 5-325 MG  tablet, Take 1 tablet by mouth every 8 (eight) hours as needed for moderate pain. (Patient not taking: Reported on 11/02/2017), Disp: 30 tablet, Rfl: 0 .  Lancets (ONETOUCH ULTRASOFT) lancets, Check blood sugar once daily (Patient not taking: Reported on 11/02/2017), Disp: 100 each, Rfl: 3  Review of Systems  Constitutional: Negative.   HENT: Negative.   Respiratory: Negative.   Cardiovascular: Negative.   Gastrointestinal: Negative.   Endocrine: Negative.   Musculoskeletal: Negative.   Neurological: Negative.     Social History   Tobacco Use  . Smoking status: Never Smoker  . Smokeless tobacco: Never Used  Substance Use Topics  . Alcohol use: Yes    Comment: occasionally 1 glass of wine   Objective:   BP (!) 158/88 (BP Location: Left Arm, Patient Position: Sitting, Cuff Size: Normal)   Pulse 77   Temp 97.9 F (36.6 C) (Oral)   Resp 16   Wt 197 lb 9.6 oz (89.6 kg)   SpO2 97%   BMI 36.14 kg/m  Vitals:   11/02/17 1002  BP: (!) 158/88  Pulse: 77  Resp: 16  Temp: 97.9 F (36.6 C)  TempSrc: Oral  SpO2: 97%  Weight: 197 lb 9.6 oz (89.6 kg)     Physical Exam  Constitutional: She appears well-developed and well-nourished. No distress.  Neck: Normal range of motion. Neck supple.  Cardiovascular: Normal rate, regular rhythm and normal heart sounds. Exam reveals no gallop and no friction rub.  No murmur heard. Pulmonary/Chest: Effort normal and breath sounds normal. No respiratory distress. She has no wheezes. She has no rales.  Skin: She is not diaphoretic.  Vitals reviewed.       Assessment & Plan:     1. Essential hypertension Slightly elevated today. Benazepril 38m BID, HCTZ 558mdaily and amlodipine 69m70maily. Discussed adding a beta blocker. Wants to wait. Will recheck in 4 weeks and she is to monitor her BP at home and bring in readings. Will check labs as below and f/u pending results. - Comprehensive metabolic panel  2. Type 2 diabetes mellitus with  microalbuminuria, without long-term current use of insulin (HCC) Previously stable. Continue metformin 1000m56mill check labs as below and f/u pending results. - Hemoglobin A1c  3. Hypercholesteremia Stable. Continue pravastatin 10mg76m Lipid panel  4. Anemia, deficiency Will check labs as below and f/u pending results. - CBC with Differential/Platelet  5. Benign hypertension with CKD (chronic kidney disease), stage II Stable. Will check labs as below and f/u pending results. - CBC with Differential/Platelet  6. Influenza vaccination declined       JenniMar DaringC  [facility]

## 2017-11-03 LAB — COMPREHENSIVE METABOLIC PANEL
A/G RATIO: 1.6 (ref 1.2–2.2)
ALT: 7 IU/L (ref 0–32)
AST: 8 IU/L (ref 0–40)
Albumin: 4.3 g/dL (ref 3.5–4.8)
Alkaline Phosphatase: 51 IU/L (ref 39–117)
BUN/Creatinine Ratio: 21 (ref 12–28)
BUN: 23 mg/dL (ref 8–27)
Bilirubin Total: 0.3 mg/dL (ref 0.0–1.2)
CALCIUM: 9.6 mg/dL (ref 8.7–10.3)
CO2: 24 mmol/L (ref 20–29)
CREATININE: 1.12 mg/dL — AB (ref 0.57–1.00)
Chloride: 100 mmol/L (ref 96–106)
GFR, EST AFRICAN AMERICAN: 55 mL/min/{1.73_m2} — AB (ref >59)
GFR, EST NON AFRICAN AMERICAN: 48 mL/min/{1.73_m2} — AB (ref >59)
Globulin, Total: 2.7 g/dL (ref 1.5–4.5)
Glucose: 83 mg/dL (ref 65–99)
POTASSIUM: 4.1 mmol/L (ref 3.5–5.2)
Sodium: 140 mmol/L (ref 134–144)
TOTAL PROTEIN: 7 g/dL (ref 6.0–8.5)

## 2017-11-03 LAB — CBC WITH DIFFERENTIAL/PLATELET
BASOS: 0 %
Basophils Absolute: 0 10*3/uL (ref 0.0–0.2)
EOS (ABSOLUTE): 0 10*3/uL (ref 0.0–0.4)
EOS: 0 %
Hematocrit: 32.5 % — ABNORMAL LOW (ref 34.0–46.6)
Hemoglobin: 10.9 g/dL — ABNORMAL LOW (ref 11.1–15.9)
IMMATURE GRANULOCYTES: 0 %
Immature Grans (Abs): 0 10*3/uL (ref 0.0–0.1)
LYMPHS: 23 %
Lymphocytes Absolute: 1.8 10*3/uL (ref 0.7–3.1)
MCH: 28.5 pg (ref 26.6–33.0)
MCHC: 33.5 g/dL (ref 31.5–35.7)
MCV: 85 fL (ref 79–97)
MONOCYTES: 8 %
Monocytes Absolute: 0.6 10*3/uL (ref 0.1–0.9)
Neutrophils Absolute: 5.2 10*3/uL (ref 1.4–7.0)
Neutrophils: 69 %
PLATELETS: 278 10*3/uL (ref 150–450)
RBC: 3.82 x10E6/uL (ref 3.77–5.28)
RDW: 12.7 % (ref 12.3–15.4)
WBC: 7.6 10*3/uL (ref 3.4–10.8)

## 2017-11-03 LAB — HEMOGLOBIN A1C
ESTIMATED AVERAGE GLUCOSE: 131 mg/dL
Hgb A1c MFr Bld: 6.2 % — ABNORMAL HIGH (ref 4.8–5.6)

## 2017-11-03 LAB — LIPID PANEL
CHOL/HDL RATIO: 3.7 ratio (ref 0.0–4.4)
Cholesterol, Total: 283 mg/dL — ABNORMAL HIGH (ref 100–199)
HDL: 77 mg/dL (ref >39)
LDL Calculated: 191 mg/dL — ABNORMAL HIGH (ref 0–99)
Triglycerides: 76 mg/dL (ref 0–149)
VLDL Cholesterol Cal: 15 mg/dL (ref 5–40)

## 2017-11-04 ENCOUNTER — Telehealth: Payer: Self-pay

## 2017-11-04 NOTE — Telephone Encounter (Signed)
No answer

## 2017-11-04 NOTE — Telephone Encounter (Signed)
-----   Message from Mar Daring, [place] sent at 11/04/2017 11:19 AM EDT ----- All labs are stable when compared to 8 months and 1 yr ago.

## 2017-11-09 NOTE — Telephone Encounter (Signed)
Patient advised as directed below.  Thanks,  -Zakaiya Lares 

## 2017-11-28 DIAGNOSIS — M545 Low back pain: Secondary | ICD-10-CM | POA: Diagnosis not present

## 2017-11-29 NOTE — Progress Notes (Deleted)
Patient: Miranda Olsen Female    DOB: [birth year]   77 y.o.   MRN: [id number] Visit Date: 11/29/2017  Today's Provider: Mar Daring, PA-C   No chief complaint on file.  Subjective:    HPI  Hypertension, follow-up:  BP Readings from Last 3 Encounters:  11/02/17 (!) 158/88  07/18/17 130/80  03/03/17 120/80    She was last seen for hypertension 4 weeks ago.  BP at that visit was 158/88 Management since that visit includes none. Continue Benazepril 20 mg BID, HCTZ 50 mg and Amlodipine 5 mg. She reports excellent compliance with treatment. She is not having side effects.  She is not exercising. She is not adherent to low salt diet.   Outside blood pressures are ***. She is experiencing {Symptoms; cardiac:12860}.  Patient denies {Symptoms; cardiac:12860}.   Cardiovascular risk factors include advanced age (older than 22 for men, 78 for women), diabetes mellitus, dyslipidemia and hypertension.     Weight trend: stable Wt Readings from Last 3 Encounters:  11/02/17 197 lb 9.6 oz (89.6 kg)  07/18/17 197 lb 3.2 oz (89.4 kg)  03/03/17 200 lb (90.7 kg)    Current diet: in general, an "unhealthy" diet  ------------------------------------------------------------------------     No Known Allergies   Current Outpatient Medications:  .  amLODipine (NORVASC) 5 MG tablet, Take 1 tablet (5 mg total) by mouth daily., Disp: 90 tablet, Rfl: 3 .  benazepril (LOTENSIN) 20 MG tablet, TAKE 1 TABLET TWICE DAILY, Disp: 180 tablet, Rfl: 1 .  Blood Glucose Monitoring Suppl (ONE TOUCH ULTRA SYSTEM KIT) w/Device KIT, [place] blood sugar once per day, Disp: 1 each, Rfl: 0 .  cholecalciferol (VITAMIN D) 1000 UNITS tablet, Take 1,000 Units by mouth daily. , Disp: , Rfl:  .  cyanocobalamin 1000 MCG tablet, Take 1,000 mcg by mouth daily. , Disp: , Rfl:  .  ferrous sulfate 325 (65 FE) MG tablet, Take 1 tablet (325 mg total) by mouth daily., Disp: 90 tablet, Rfl: 1 .  glucose blood  test strip, Check blood sugar once daily, Disp: 100 each, Rfl: 4 .  hydrochlorothiazide (HYDRODIURIL) 50 MG tablet, TAKE 1 TABLET EVERY DAY, Disp: 90 tablet, Rfl: 1 .  HYDROcodone-acetaminophen (NORCO/VICODIN) 5-325 MG tablet, Take 1 tablet by mouth every 8 (eight) hours as needed for moderate pain. (Patient not taking: Reported on 11/02/2017), Disp: 30 tablet, Rfl: 0 .  Lancets (ONETOUCH ULTRASOFT) lancets, Check blood sugar once daily (Patient not taking: Reported on 11/02/2017), Disp: 100 each, Rfl: 3 .  meloxicam (MOBIC) 7.5 MG tablet, , Disp: , Rfl:  .  metFORMIN (GLUCOPHAGE) 1000 MG tablet, TAKE 1 TABLET TWICE DAILY WITH MEALS, Disp: 180 tablet, Rfl: 1 .  omeprazole (PRILOSEC) 40 MG capsule, TAKE 1 CAPSULE EVERY DAY, Disp: 90 capsule, Rfl: 1 .  pravastatin (PRAVACHOL) 10 MG tablet, Take 1 tablet (10 mg total) by mouth at bedtime. (Patient taking differently: Take 10 mg by mouth at bedtime. ), Disp: 90 tablet, Rfl: 1  Review of Systems  Social History   Tobacco Use  . Smoking status: Never Smoker  . Smokeless tobacco: Never Used  Substance Use Topics  . Alcohol use: Yes    Comment: occasionally 1 glass of wine   Objective:   There were no vitals taken for this visit. There were no vitals filed for this visit.   Physical Exam      Assessment & Plan:  Mar Daring, PA-C  [facility]

## 2017-11-30 ENCOUNTER — Encounter: Payer: Self-pay | Admitting: Physician Assistant

## 2017-11-30 ENCOUNTER — Ambulatory Visit (INDEPENDENT_AMBULATORY_CARE_PROVIDER_SITE_OTHER): Payer: Medicare HMO | Admitting: Physician Assistant

## 2017-11-30 ENCOUNTER — Ambulatory Visit: Payer: Medicare HMO | Admitting: Physician Assistant

## 2017-11-30 VITALS — BP 159/93 | HR 83 | Temp 98.5°F | Resp 16 | Wt 199.6 lb

## 2017-11-30 DIAGNOSIS — I10 Essential (primary) hypertension: Secondary | ICD-10-CM | POA: Diagnosis not present

## 2017-11-30 MED ORDER — METOPROLOL SUCCINATE ER 25 MG PO TB24: 25.0000 mg | ORAL_TABLET | Freq: Every day | ORAL | 3 refills | Status: DC

## 2017-11-30 NOTE — Patient Instructions (Signed)
Metoprolol extended-release tablets What is this medicine? METOPROLOL (me TOE proe lole) is a beta-blocker. Beta-blockers reduce the workload on the heart and help it to beat more regularly. This medicine is used to treat high blood pressure and to prevent chest pain. It is also used to after a heart attack and to prevent an additional heart attack from occurring. This medicine may be used for other purposes; ask your health care provider or pharmacist if you have questions. COMMON BRAND NAME(S): toprol, Toprol XL What should I tell my health care provider before I take this medicine? They need to know if you have any of these conditions: -diabetes -heart or vessel disease like slow heart rate, worsening heart failure, heart block, sick sinus syndrome or Raynaud's disease -kidney disease -liver disease -lung or breathing disease, like asthma or emphysema -pheochromocytoma -thyroid disease -an unusual or allergic reaction to metoprolol, other beta-blockers, medicines, foods, dyes, or preservatives -pregnant or trying to get pregnant -breast-feeding How should I use this medicine? Take this medicine by mouth with a glass of water. Follow the directions on the prescription label. Do not crush or chew. Take this medicine with or immediately after meals. Take your doses at regular intervals. Do not take more medicine than directed. Do not stop taking this medicine suddenly. This could lead to serious heart-related effects. Talk to your pediatrician regarding the use of this medicine in children. While this drug may be prescribed for children as young as 6 years for selected conditions, precautions do apply. Overdosage: If you think you have taken too much of this medicine contact a poison control center or emergency room at once. NOTE: This medicine is only for you. Do not share this medicine with others. What if I miss a dose? If you miss a dose, take it as soon as you can. If it is almost time  for your next dose, take only that dose. Do not take double or extra doses. What may interact with this medicine? This medicine may interact with the following medications: -certain medicines for blood pressure, heart disease, irregular heart beat -certain medicines for depression, like monoamine oxidase (MAO) inhibitors, fluoxetine, or paroxetine -clonidine -dobutamine -epinephrine -isoproterenol -reserpine This list may not describe all possible interactions. Give your health care provider a list of all the medicines, herbs, non-prescription drugs, or dietary supplements you use. Also tell them if you smoke, drink alcohol, or use illegal drugs. Some items may interact with your medicine. What should I watch for while using this medicine? Visit your doctor or health care professional for regular check ups. Contact your doctor right away if your symptoms worsen. Check your blood pressure and pulse rate regularly. Ask your health care professional what your blood pressure and pulse rate should be, and when you should contact them. You may get drowsy or dizzy. Do not drive, use machinery, or do anything that needs mental alertness until you know how this medicine affects you. Do not sit or stand up quickly, especially if you are an older patient. This reduces the risk of dizzy or fainting spells. Contact your doctor if these symptoms continue. Alcohol may interfere with the effect of this medicine. Avoid alcoholic drinks. What side effects may I notice from receiving this medicine? Side effects that you should report to your doctor or health care professional as soon as possible: -allergic reactions like skin rash, itching or hives -cold or numb hands or feet -depression -difficulty breathing -faint -fever with sore throat -irregular heartbeat, chest pain -  rapid weight gain -swollen legs or ankles Side effects that usually do not require medical attention (report to your doctor or health care  professional if they continue or are bothersome): -anxiety or nervousness -change in sex drive or performance -dry skin -headache -nightmares or trouble sleeping -short term memory loss -stomach upset or diarrhea -unusually tired This list may not describe all possible side effects. Call your doctor for medical advice about side effects. You may report side effects to FDA at 1-800-FDA-1088. Where should I keep my medicine? Keep out of the reach of children. Store at room temperature between 15 and 30 degrees C (59 and 86 degrees F). Throw away any unused medicine after the expiration date. NOTE: This sheet is a summary. It may not cover all possible information. If you have questions about this medicine, talk to your doctor, pharmacist, or health care provider.  2018 Elsevier/Gold Standard (2012-09-15 14:41:37)

## 2017-11-30 NOTE — Progress Notes (Signed)
Patient: Miranda Olsen Female    DOB: 06/26/1940   77 y.o.   MRN: [id number] Visit Date: 11/30/2017  Today's Provider: Mar Daring, PA-C   Chief Complaint  Patient presents with  . Follow-up    HTN   Subjective:    HPI  Hypertension, follow-up:  BP Readings from Last 3 Encounters:  11/30/17 (!) 159/93  11/02/17 (!) 158/88  07/18/17 130/80    She was last seen for hypertension 4 weeks ago.  BP at that visit was 158/88. Management since that visit includes none. Continue Benazepril 20 mg BID, HCTZ 50 mg daily, and Amlodipine 5 mg. Discussed Beta Blocker. She reports excellent compliance with treatment. She is not having side effects.  She is not exercising. She is not adherent to low salt diet.   Outside blood pressures are n/a. She is experiencing lower extremity edema.  Patient denies chest pain, chest pressure/discomfort, exertional chest pressure/discomfort, fatigue, irregular heart beat, near-syncope and palpitations.   Cardiovascular risk factors include advanced age (older than 63 for men, 58 for women), diabetes mellitus, dyslipidemia and hypertension.     Weight trend: stable Wt Readings from Last 3 Encounters:  11/30/17 199 lb 9.6 oz (90.5 kg)  11/02/17 197 lb 9.6 oz (89.6 kg)  07/18/17 197 lb 3.2 oz (89.4 kg)    Current diet: in general, an "unhealthy" diet ------------------------------------------------------------------------     No Known Allergies   Current Outpatient Medications:  .  amLODipine (NORVASC) 5 MG tablet, Take 1 tablet (5 mg total) by mouth daily., Disp: 90 tablet, Rfl: 3 .  benazepril (LOTENSIN) 20 MG tablet, TAKE 1 TABLET TWICE DAILY, Disp: 180 tablet, Rfl: 1 .  Blood Glucose Monitoring Suppl (ONE TOUCH ULTRA SYSTEM KIT) w/Device KIT, [place] blood sugar once per day, Disp: 1 each, Rfl: 0 .  cholecalciferol (VITAMIN D) 1000 UNITS tablet, Take 1,000 Units by mouth daily. , Disp: , Rfl:  .  cyanocobalamin 1000 MCG  tablet, Take 1,000 mcg by mouth daily. , Disp: , Rfl:  .  ferrous sulfate 325 (65 FE) MG tablet, Take 1 tablet (325 mg total) by mouth daily., Disp: 90 tablet, Rfl: 1 .  glucose blood test strip, Check blood sugar once daily, Disp: 100 each, Rfl: 4 .  hydrochlorothiazide (HYDRODIURIL) 50 MG tablet, TAKE 1 TABLET EVERY DAY, Disp: 90 tablet, Rfl: 1 .  meloxicam (MOBIC) 7.5 MG tablet, , Disp: , Rfl:  .  metFORMIN (GLUCOPHAGE) 1000 MG tablet, TAKE 1 TABLET TWICE DAILY WITH MEALS, Disp: 180 tablet, Rfl: 1 .  omeprazole (PRILOSEC) 40 MG capsule, TAKE 1 CAPSULE EVERY DAY, Disp: 90 capsule, Rfl: 1 .  pravastatin (PRAVACHOL) 10 MG tablet, Take 1 tablet (10 mg total) by mouth at bedtime. (Patient taking differently: Take 10 mg by mouth at bedtime. ), Disp: 90 tablet, Rfl: 1 .  HYDROcodone-acetaminophen (NORCO/VICODIN) 5-325 MG tablet, Take 1 tablet by mouth every 8 (eight) hours as needed for moderate pain. (Patient not taking: Reported on 11/02/2017), Disp: 30 tablet, Rfl: 0 .  Lancets (ONETOUCH ULTRASOFT) lancets, Check blood sugar once daily (Patient not taking: Reported on 11/02/2017), Disp: 100 each, Rfl: 3  Review of Systems  Constitutional: Negative.   Respiratory: Negative.   Cardiovascular: Negative.   Musculoskeletal: Positive for back pain.  Neurological: Negative.     Social History   Tobacco Use  . Smoking status: Never Smoker  . Smokeless tobacco: Never Used  Substance Use Topics  . Alcohol use: Yes  Comment: occasionally 1 glass of wine   Objective:   BP (!) 159/93 (BP Location: Left Arm, Patient Position: Sitting, Cuff Size: Normal)   Pulse 83   Temp 98.5 F (36.9 C) (Oral)   Resp 16   Wt 199 lb 9.6 oz (90.5 kg)   BMI 36.51 kg/m  Vitals:   11/30/17 1723  BP: (!) 159/93  Pulse: 83  Resp: 16  Temp: 98.5 F (36.9 C)  TempSrc: Oral  Weight: 199 lb 9.6 oz (90.5 kg)     Physical Exam  Constitutional: She appears well-developed and well-nourished. No distress.  Neck:  Normal range of motion. Neck supple.  Cardiovascular: Normal rate, regular rhythm and normal heart sounds. Exam reveals no gallop and no friction rub.  No murmur heard. Pulmonary/Chest: Effort normal and breath sounds normal. No respiratory distress. She has no wheezes. She has no rales.  Musculoskeletal: She exhibits edema.  Skin: She is not diaphoretic.  Vitals reviewed.      Assessment & Plan:     1. Essential hypertension Still increased. Continue Benazepril '20mg'$  BID, Amlodipine '5mg'$  (dose max due to swelling) and HCTZ '50mg'$ . Will add metoprolol XR '25mg'$  daily. I will see her back in 4 weeks to recheck.  - metoprolol succinate (TOPROL-XL) 25 MG 24 hr tablet; Take 1 tablet (25 mg total) by mouth daily.  Dispense: 90 tablet; Refill: [place], PA-C  [facility]

## 2017-12-13 ENCOUNTER — Other Ambulatory Visit: Payer: Self-pay | Admitting: Physician Assistant

## 2017-12-13 DIAGNOSIS — K219 Gastro-esophageal reflux disease without esophagitis: Secondary | ICD-10-CM

## 2017-12-13 DIAGNOSIS — I10 Essential (primary) hypertension: Secondary | ICD-10-CM

## 2017-12-28 ENCOUNTER — Encounter: Payer: Self-pay | Admitting: Physician Assistant

## 2017-12-28 ENCOUNTER — Ambulatory Visit (INDEPENDENT_AMBULATORY_CARE_PROVIDER_SITE_OTHER): Payer: Medicare HMO | Admitting: Physician Assistant

## 2017-12-28 VITALS — BP 159/91 | HR 74 | Temp 97.8°F | Wt 197.4 lb

## 2017-12-28 DIAGNOSIS — I129 Hypertensive chronic kidney disease with stage 1 through stage 4 chronic kidney disease, or unspecified chronic kidney disease: Secondary | ICD-10-CM

## 2017-12-28 DIAGNOSIS — N183 Chronic kidney disease, stage 3 (moderate): Secondary | ICD-10-CM

## 2017-12-28 MED ORDER — METOPROLOL SUCCINATE ER 25 MG PO TB24: 50.0000 mg | ORAL_TABLET | Freq: Every day | ORAL | 3 refills | Status: DC

## 2017-12-28 NOTE — Patient Instructions (Signed)
DASH Eating Plan DASH stands for "Dietary Approaches to Stop Hypertension." The DASH eating plan is a healthy eating plan that has been shown to reduce high blood pressure (hypertension). It may also reduce your risk for type 2 diabetes, heart disease, and stroke. The DASH eating plan may also help with weight loss. What are tips for following this plan? General guidelines  Avoid eating more than 2,300 mg (milligrams) of salt (sodium) a day. If you have hypertension, you may need to reduce your sodium intake to 1,500 mg a day.  Limit alcohol intake to no more than 1 drink a day for nonpregnant women and 2 drinks a day for men. One drink equals 12 oz of beer, 5 oz of wine, or 1 oz of hard liquor.  Work with your health care provider to maintain a healthy body weight or to lose weight. Ask what an ideal weight is for you.  Get at least 30 minutes of exercise that causes your heart to beat faster (aerobic exercise) most days of the week. Activities may include walking, swimming, or biking.  Work with your health care provider or diet and nutrition specialist (dietitian) to adjust your eating plan to your individual calorie needs. Reading food labels  Check food labels for the amount of sodium per serving. Choose foods with less than 5 percent of the Daily Value of sodium. Generally, foods with less than 300 mg of sodium per serving fit into this eating plan.  To find whole grains, look for the word "whole" as the first word in the ingredient list. Shopping  Buy products labeled as "low-sodium" or "no salt added."  Buy fresh foods. Avoid canned foods and premade or frozen meals. Cooking  Avoid adding salt when cooking. Use salt-free seasonings or herbs instead of table salt or sea salt. Check with your health care provider or pharmacist before using salt substitutes.  Do not fry foods. Cook foods using healthy methods such as baking, boiling, grilling, and broiling instead.  Cook with  heart-healthy oils, such as olive, canola, soybean, or sunflower oil. Meal planning   Eat a balanced diet that includes: ? 5 or more servings of fruits and vegetables each day. At each meal, try to fill half of your plate with fruits and vegetables. ? Up to 6-8 servings of whole grains each day. ? Less than 6 oz of lean meat, poultry, or fish each day. A 3-oz serving of meat is about the same size as a deck of cards. One egg equals 1 oz. ? 2 servings of low-fat dairy each day. ? A serving of nuts, seeds, or beans 5 times each week. ? Heart-healthy fats. Healthy fats called Omega-3 fatty acids are found in foods such as flaxseeds and coldwater fish, like sardines, salmon, and mackerel.  Limit how much you eat of the following: ? Canned or prepackaged foods. ? Food that is high in trans fat, such as fried foods. ? Food that is high in saturated fat, such as fatty meat. ? Sweets, desserts, sugary drinks, and other foods with added sugar. ? Full-fat dairy products.  Do not salt foods before eating.  Try to eat at least 2 vegetarian meals each week.  Eat more home-cooked food and less restaurant, buffet, and fast food.  When eating at a restaurant, ask that your food be prepared with less salt or no salt, if possible. What foods are recommended? The items listed may not be a complete list. Talk with your dietitian about what   dietary choices are best for you. Grains Whole-grain or whole-wheat bread. Whole-grain or whole-wheat pasta. Brown rice. Oatmeal. Quinoa. Bulgur. Whole-grain and low-sodium cereals. Pita bread. Low-fat, low-sodium crackers. Whole-wheat flour tortillas. Vegetables Fresh or frozen vegetables (raw, steamed, roasted, or grilled). Low-sodium or reduced-sodium tomato and vegetable juice. Low-sodium or reduced-sodium tomato sauce and tomato paste. Low-sodium or reduced-sodium canned vegetables. Fruits All fresh, dried, or frozen fruit. Canned fruit in natural juice (without  added sugar). Meat and other protein foods Skinless chicken or turkey. Ground chicken or turkey. Pork with fat trimmed off. Fish and seafood. Egg whites. Dried beans, peas, or lentils. Unsalted nuts, nut butters, and seeds. Unsalted canned beans. Lean cuts of beef with fat trimmed off. Low-sodium, lean deli meat. Dairy Low-fat (1%) or fat-free (skim) milk. Fat-free, low-fat, or reduced-fat cheeses. Nonfat, low-sodium ricotta or cottage cheese. Low-fat or nonfat yogurt. Low-fat, low-sodium cheese. Fats and oils Soft margarine without trans fats. Vegetable oil. Low-fat, reduced-fat, or light mayonnaise and salad dressings (reduced-sodium). Canola, safflower, olive, soybean, and sunflower oils. Avocado. Seasoning and other foods Herbs. Spices. Seasoning mixes without salt. Unsalted popcorn and pretzels. Fat-free sweets. What foods are not recommended? The items listed may not be a complete list. Talk with your dietitian about what dietary choices are best for you. Grains Baked goods made with fat, such as croissants, muffins, or some breads. Dry pasta or rice meal packs. Vegetables Creamed or fried vegetables. Vegetables in a cheese sauce. Regular canned vegetables (not low-sodium or reduced-sodium). Regular canned tomato sauce and paste (not low-sodium or reduced-sodium). Regular tomato and vegetable juice (not low-sodium or reduced-sodium). Pickles. Olives. Fruits Canned fruit in a light or heavy syrup. Fried fruit. Fruit in cream or butter sauce. Meat and other protein foods Fatty cuts of meat. Ribs. Fried meat. Bacon. Sausage. Bologna and other processed lunch meats. Salami. Fatback. Hotdogs. Bratwurst. Salted nuts and seeds. Canned beans with added salt. Canned or smoked fish. Whole eggs or egg yolks. Chicken or turkey with skin. Dairy Whole or 2% milk, cream, and half-and-half. Whole or full-fat cream cheese. Whole-fat or sweetened yogurt. Full-fat cheese. Nondairy creamers. Whipped toppings.  Processed cheese and cheese spreads. Fats and oils Butter. Stick margarine. Lard. Shortening. Ghee. Bacon fat. Tropical oils, such as coconut, palm kernel, or palm oil. Seasoning and other foods Salted popcorn and pretzels. Onion salt, garlic salt, seasoned salt, table salt, and sea salt. Worcestershire sauce. Tartar sauce. Barbecue sauce. Teriyaki sauce. Soy sauce, including reduced-sodium. Steak sauce. Canned and packaged gravies. Fish sauce. Oyster sauce. Cocktail sauce. Horseradish that you find on the shelf. Ketchup. Mustard. Meat flavorings and tenderizers. Bouillon cubes. Hot sauce and Tabasco sauce. Premade or packaged marinades. Premade or packaged taco seasonings. Relishes. Regular salad dressings. Where to find more information:  National Heart, Lung, and Blood Institute: www.nhlbi.nih.gov  American Heart Association: www.heart.org Summary  The DASH eating plan is a healthy eating plan that has been shown to reduce high blood pressure (hypertension). It may also reduce your risk for type 2 diabetes, heart disease, and stroke.  With the DASH eating plan, you should limit salt (sodium) intake to 2,300 mg a day. If you have hypertension, you may need to reduce your sodium intake to 1,500 mg a day.  When on the DASH eating plan, aim to eat more fresh fruits and vegetables, whole grains, lean proteins, low-fat dairy, and heart-healthy fats.  Work with your health care provider or diet and nutrition specialist (dietitian) to adjust your eating plan to your individual   calorie needs. This information is not intended to replace advice given to you by your health care provider. Make sure you discuss any questions you have with your health care provider. Document Released: 12/31/2010 Document Revised: 01/05/2016 Document Reviewed: 01/05/2016 Elsevier Interactive Patient Education  2018 Elsevier Inc.  

## 2017-12-28 NOTE — Progress Notes (Signed)
Patient: Miranda Olsen Female    DOB: 23-Jun-1940   77 y.o.   MRN: [id number] Visit Date: 12/28/2017  Today's Provider: Mar Daring, PA-C   Chief Complaint  Patient presents with  . Hypertension   Subjective:    HPI  Hypertension, follow-up:  BP Readings from Last 3 Encounters:  12/28/17 (!) 159/91  11/30/17 (!) 159/93  11/02/17 (!) 158/88    She was last seen for hypertension 4 weeks ago.  BP at that visit was 159/91. Management changes since that visit include: Continue Benazepril '20mg'$  BID, Amlodipine '5mg'$  (dose max due to swelling) and HCTZ '50mg'$ . Will add metoprolol XR '25mg'$  daily . She reports good compliance with treatment. She is not having side effects.  She is exercising. She is not adherent to low salt diet.   Outside blood pressures are n/a. She is experiencing none.  Patient denies chest pain, chest pressure/discomfort, exertional chest pressure/discomfort, fatigue, irregular heart beat, palpitations and tachypnea.   Cardiovascular risk factors include advanced age (older than 57 for men, 47 for women).  Use of agents associated with hypertension: none.     Weight trend: stable Wt Readings from Last 3 Encounters:  12/28/17 197 lb 6.4 oz (89.5 kg)  11/30/17 199 lb 9.6 oz (90.5 kg)  11/02/17 197 lb 9.6 oz (89.6 kg)    Current diet: well balanced  ------------------------------------------------------------------------     No Known Allergies   Current Outpatient Medications:  .  amLODipine (NORVASC) 5 MG tablet, TAKE 1 TABLET EVERY DAY, Disp: 90 tablet, Rfl: 1 .  benazepril (LOTENSIN) 20 MG tablet, TAKE 1 TABLET TWICE DAILY, Disp: 180 tablet, Rfl: 1 .  Blood Glucose Monitoring Suppl (ONE TOUCH ULTRA SYSTEM KIT) w/Device KIT, [place] blood sugar once per day, Disp: 1 each, Rfl: 0 .  cholecalciferol (VITAMIN D) 1000 UNITS tablet, Take 1,000 Units by mouth daily. , Disp: , Rfl:  .  cyanocobalamin 1000 MCG tablet, Take 1,000 mcg by mouth  daily. , Disp: , Rfl:  .  ferrous sulfate 325 (65 FE) MG tablet, Take 1 tablet (325 mg total) by mouth daily., Disp: 90 tablet, Rfl: 1 .  glucose blood test strip, Check blood sugar once daily, Disp: 100 each, Rfl: 4 .  hydrochlorothiazide (HYDRODIURIL) 50 MG tablet, TAKE 1 TABLET EVERY DAY, Disp: 90 tablet, Rfl: 1 .  meloxicam (MOBIC) 7.5 MG tablet, , Disp: , Rfl:  .  metFORMIN (GLUCOPHAGE) 1000 MG tablet, TAKE 1 TABLET TWICE DAILY WITH MEALS, Disp: 180 tablet, Rfl: 1 .  metoprolol succinate (TOPROL-XL) 25 MG 24 hr tablet, Take 1 tablet (25 mg total) by mouth daily., Disp: 90 tablet, Rfl: 3 .  omeprazole (PRILOSEC) 40 MG capsule, TAKE 1 CAPSULE EVERY DAY, Disp: 90 capsule, Rfl: 1 .  pravastatin (PRAVACHOL) 10 MG tablet, Take 1 tablet (10 mg total) by mouth at bedtime. (Patient taking differently: Take 10 mg by mouth at bedtime. ), Disp: 90 tablet, Rfl: 1 .  HYDROcodone-acetaminophen (NORCO/VICODIN) 5-325 MG tablet, Take 1 tablet by mouth every 8 (eight) hours as needed for moderate pain. (Patient not taking: Reported on 11/02/2017), Disp: 30 tablet, Rfl: 0 .  Lancets (ONETOUCH ULTRASOFT) lancets, Check blood sugar once daily (Patient not taking: Reported on 11/02/2017), Disp: 100 each, Rfl: 3  Review of Systems  Constitutional: Negative.   HENT: Negative.   Respiratory: Negative.   Gastrointestinal: Negative.   Genitourinary: Negative.   Neurological: Negative.     Social History   Tobacco  Use  . Smoking status: Never Smoker  . Smokeless tobacco: Never Used  Substance Use Topics  . Alcohol use: Yes    Comment: occasionally 1 glass of wine   Objective:   BP (!) 159/91 (BP Location: Left Arm, Patient Position: Sitting, Cuff Size: Large)   Pulse 74   Temp 97.8 F (36.6 C) (Oral)   Wt 197 lb 6.4 oz (89.5 kg)   SpO2 98%   BMI 36.10 kg/m  Vitals:   12/28/17 0838  BP: (!) 159/91  Pulse: 74  Temp: 97.8 F (36.6 C)  TempSrc: Oral  SpO2: 98%  Weight: 197 lb 6.4 oz (89.5 kg)      Physical Exam  Constitutional: She appears well-developed and well-nourished. No distress.  Neck: Normal range of motion. Neck supple.  Cardiovascular: Normal rate, regular rhythm and normal heart sounds. Exam reveals no gallop and no friction rub.  No murmur heard. Pulmonary/Chest: Effort normal and breath sounds normal. No respiratory distress. She has no wheezes. She has no rales.  Skin: She is not diaphoretic.  Vitals reviewed.       Assessment & Plan:     1. Benign hypertension with CKD (chronic kidney disease) stage III (HCC) Increase metoprolol to '50mg'$ XR (2 tabs) daily. Continue all other BP meds as prescribed.  I will see her back in 4 weeks.  - metoprolol succinate (TOPROL-XL) 25 MG 24 hr tablet; Take 2 tablets (50 mg total) by mouth daily.  Dispense: 90 tablet; Refill: [place], PA-C  [facility]

## 2018-01-30 NOTE — Progress Notes (Signed)
Patient: Miranda Olsen Female    DOB: 04-27-1940   78 y.o.   MRN: [id number] Visit Date: 01/31/2018  Today's Provider: Mar Daring, PA-C   Chief Complaint  Patient presents with  . Follow-up  . Hypertension   Subjective:     HPI   Hypertension, follow-up:  BP Readings from Last 3 Encounters:  01/31/18 (!) 171/95  12/28/17 (!) 159/91  11/30/17 (!) 159/93    She was last seen for hypertension 1 months ago.  BP at that visit was 159/91. Management since that visit includes; increased metoprolol XR to 50 mg bid .She reports good compliance with treatment. She is not having side effects. None She is exercising. She is not adherent to low salt diet.   Outside blood pressures are not checking. She is experiencing none.  Patient denies none.   Cardiovascular risk factors include advanced age (older than 61 for men, 51 for women).  Use of agents associated with hypertension: none.   ---------------------------------------------------------------   No Known Allergies   Current Outpatient Medications:  .  amLODipine (NORVASC) 5 MG tablet, TAKE 1 TABLET EVERY DAY, Disp: 90 tablet, Rfl: 1 .  benazepril (LOTENSIN) 20 MG tablet, TAKE 1 TABLET TWICE DAILY, Disp: 180 tablet, Rfl: 1 .  Blood Glucose Monitoring Suppl (ONE TOUCH ULTRA SYSTEM KIT) w/Device KIT, [place] blood sugar once per day, Disp: 1 each, Rfl: 0 .  cholecalciferol (VITAMIN D) 1000 UNITS tablet, Take 1,000 Units by mouth daily. , Disp: , Rfl:  .  cyanocobalamin 1000 MCG tablet, Take 1,000 mcg by mouth daily. , Disp: , Rfl:  .  ferrous sulfate 325 (65 FE) MG tablet, Take 1 tablet (325 mg total) by mouth daily., Disp: 90 tablet, Rfl: 1 .  glucose blood test strip, Check blood sugar once daily, Disp: 100 each, Rfl: 4 .  hydrochlorothiazide (HYDRODIURIL) 50 MG tablet, TAKE 1 TABLET EVERY DAY, Disp: 90 tablet, Rfl: 1 .  HYDROcodone-acetaminophen (NORCO/VICODIN) 5-325 MG tablet, Take 1 tablet by mouth every  8 (eight) hours as needed for moderate pain., Disp: 30 tablet, Rfl: 0 .  Lancets (ONETOUCH ULTRASOFT) lancets, Check blood sugar once daily, Disp: 100 each, Rfl: 3 .  meloxicam (MOBIC) 7.5 MG tablet, , Disp: , Rfl:  .  metFORMIN (GLUCOPHAGE) 1000 MG tablet, TAKE 1 TABLET TWICE DAILY WITH MEALS, Disp: 180 tablet, Rfl: 1 .  metoprolol succinate (TOPROL-XL) 25 MG 24 hr tablet, Take 2 tablets (50 mg total) by mouth daily., Disp: 90 tablet, Rfl: 3 .  omeprazole (PRILOSEC) 40 MG capsule, TAKE 1 CAPSULE EVERY DAY, Disp: 90 capsule, Rfl: 1 .  pravastatin (PRAVACHOL) 10 MG tablet, Take 1 tablet (10 mg total) by mouth at bedtime. (Patient taking differently: Take 10 mg by mouth at bedtime. ), Disp: 90 tablet, Rfl: 1  Review of Systems  Constitutional: Negative for appetite change, chills, fatigue and fever.  Respiratory: Negative for chest tightness and shortness of breath.   Cardiovascular: Negative for chest pain and palpitations.  Gastrointestinal: Negative for abdominal pain, nausea and vomiting.  Neurological: Negative for dizziness and weakness.    Social History   Tobacco Use  . Smoking status: Never Smoker  . Smokeless tobacco: Never Used  Substance Use Topics  . Alcohol use: Yes    Comment: occasionally 1 glass of wine      Objective:   BP (!) 171/95 (BP Location: Left Arm, Patient Position: Sitting, Cuff Size: Large)   Pulse 63  Temp (!) 97.5 F (36.4 C) (Oral)   Resp 16   Wt 196 lb (88.9 kg)   SpO2 99%   BMI 35.85 kg/m  Vitals:   01/31/18 0849  BP: (!) 171/95  Pulse: 63  Resp: 16  Temp: (!) 97.5 F (36.4 C)  TempSrc: Oral  SpO2: 99%  Weight: 196 lb (88.9 kg)     Physical Exam Vitals signs reviewed.  Constitutional:      General: She is not in acute distress.    Appearance: She is well-developed. She is not diaphoretic.  Neck:     Musculoskeletal: Normal range of motion and neck supple.     Vascular: No carotid bruit.  Cardiovascular:     Rate and Rhythm:  Normal rate and regular rhythm.     Heart sounds: Normal heart sounds. No murmur. No friction rub. No gallop.   Pulmonary:     Effort: Pulmonary effort is normal. No respiratory distress.     Breath sounds: Normal breath sounds. No wheezing or rales.        Assessment & Plan    1. Benign hypertension with CKD (chronic kidney disease) stage III (HCC) Continue amlodipine '5mg'$  (cannot tolerate '10mg'$  due to swelling), benazepril '20mg'$  BID, Metoprolol XR '50mg'$  and HCTZ '50mg'$ . Add clonidine as below. Referral placed to Nephrology for resistant HTN with CKD stage 3. Most recent Cr was 1.12, improved from a year previously. I will see her back in 4-6 weeks to recheck BP.  - cloNIDine (CATAPRES) 0.1 MG tablet; Take 1 tablet (0.1 mg total) by mouth daily.  Dispense: 90 tablet; Refill: 3 - Ambulatory referral to Nephrology - metoprolol succinate (TOPROL-XL) 50 MG 24 hr tablet; Take 1 tablet (50 mg total) by mouth daily. Take with or immediately following a meal.  Dispense: 90 tablet; Refill: [place], PA-C  [facility]

## 2018-01-31 ENCOUNTER — Encounter: Payer: Self-pay | Admitting: Physician Assistant

## 2018-01-31 ENCOUNTER — Ambulatory Visit (INDEPENDENT_AMBULATORY_CARE_PROVIDER_SITE_OTHER): Payer: Medicare HMO | Admitting: Physician Assistant

## 2018-01-31 VITALS — BP 173/93 | HR 63 | Temp 97.5°F | Resp 16 | Wt 196.0 lb

## 2018-01-31 DIAGNOSIS — N183 Chronic kidney disease, stage 3 unspecified: Secondary | ICD-10-CM

## 2018-01-31 DIAGNOSIS — I129 Hypertensive chronic kidney disease with stage 1 through stage 4 chronic kidney disease, or unspecified chronic kidney disease: Secondary | ICD-10-CM

## 2018-01-31 MED ORDER — CLONIDINE HCL 0.1 MG PO TABS: 0.1000 mg | ORAL_TABLET | Freq: Every day | ORAL | 3 refills | Status: DC

## 2018-01-31 MED ORDER — METOPROLOL SUCCINATE ER 50 MG PO TB24: 50.0000 mg | ORAL_TABLET | Freq: Every day | ORAL | 3 refills | Status: DC

## 2018-01-31 NOTE — Patient Instructions (Signed)
Clonidine tablets What is this medicine? CLONIDINE (KLOE ni deen) is used to treat high blood pressure. This medicine may be used for other purposes; ask your health care provider or pharmacist if you have questions. COMMON BRAND NAME(S): Catapres What should I tell my health care provider before I take this medicine? They need to know if you have any of these conditions: -kidney disease -an unusual or allergic reaction to clonidine, other medicines, foods, dyes, or preservatives -pregnant or trying to get pregnant -breast-feeding How should I use this medicine? Take this medicine by mouth with a glass of water. Follow the directions on the prescription label. Take your doses at regular intervals. Do not take your medicine more often than directed. Do not suddenly stop taking this medicine. You must gradually reduce the dose or you may get a dangerous increase in blood pressure. Ask your doctor or health care professional for advice. Talk to your pediatrician regarding the use of this medicine in children. Special care may be needed. Overdosage: If you think you have taken too much of this medicine contact a poison control center or emergency room at once. NOTE: This medicine is only for you. Do not share this medicine with others. What if I miss a dose? If you miss a dose, take it as soon as you can. If it is almost time for your next dose, take only that dose. Do not take double or extra doses. What may interact with this medicine? Do not take this medicine with any of the following medications: -MAOIs like Carbex, Eldepryl, Marplan, Nardil, and Parnate This medicine may also interact with the following medications: -barbiturate medicines for inducing sleep or treating seizures like phenobarbital -certain medicines for blood pressure, heart disease, irregular heart beat -certain medicines for depression, anxiety, or psychotic disturbances -prescription pain medicines This list may not  describe all possible interactions. Give your health care provider a list of all the medicines, herbs, non-prescription drugs, or dietary supplements you use. Also tell them if you smoke, drink alcohol, or use illegal drugs. Some items may interact with your medicine. What should I watch for while using this medicine? Visit your doctor or health care professional for regular checks on your progress. Check your heart rate and blood pressure regularly while you are taking this medicine. Ask your doctor or health care professional what your heart rate should be and when you should contact him or her. You may get drowsy or dizzy. Do not drive, use machinery, or do anything that needs mental alertness until you know how this medicine affects you. To avoid dizzy or fainting spells, do not stand or sit up quickly, especially if you are an older person. Alcohol can make you more drowsy and dizzy. Avoid alcoholic drinks. Your mouth may get dry. Chewing sugarless gum or sucking hard candy, and drinking plenty of water will help. Do not treat yourself for coughs, colds, or pain while you are taking this medicine without asking your doctor or health care professional for advice. Some ingredients may increase your blood pressure. If you are going to have surgery tell your doctor or health care professional that you are taking this medicine. What side effects may I notice from receiving this medicine? Side effects that you should report to your doctor or health care professional as soon as possible: -allergic reactions like skin rash, itching or hives, swelling of the face, lips, or tongue -anxiety, nervousness -chest pain -depression -fast, irregular heartbeat -swelling of feet or legs -unusually  weak or tired Side effects that usually do not require medical attention (report to your doctor or health care professional if they continue or are bothersome): -change in sex drive or  performance -constipation -headache This list may not describe all possible side effects. Call your doctor for medical advice about side effects. You may report side effects to FDA at 1-800-FDA-1088. Where should I keep my medicine? Keep out of the reach of children. Store at room temperature between 15 and 30 degrees C (59 and 86 degrees F). Protect from light. Keep container tightly closed. Throw away any unused medicine after the expiration date. NOTE: This sheet is a summary. It may not cover all possible information. If you have questions about this medicine, talk to your doctor, pharmacist, or health care provider.  2019 Elsevier/Gold Standard (2010-07-08 13:01:28)

## 2018-02-21 ENCOUNTER — Other Ambulatory Visit: Payer: Self-pay | Admitting: Physician Assistant

## 2018-02-21 DIAGNOSIS — Z1231 Encounter for screening mammogram for malignant neoplasm of breast: Secondary | ICD-10-CM

## 2018-02-28 ENCOUNTER — Telehealth: Payer: Self-pay

## 2018-02-28 NOTE — Telephone Encounter (Signed)
Pt advised.   Thanks,   -Laura  

## 2018-02-28 NOTE — Telephone Encounter (Signed)
Increase clonidine to one tablet every eight hours (three times daily)

## 2018-02-28 NOTE — Telephone Encounter (Signed)
Pt reports her blood pressures have been elevated.  She states at home her reading was 230/1??.  She then went to the fire dept to have them check it they got : 220/100.  Pt states that she is not having any symptoms at all.  She denies chest pain, heart palpitations, Shortness of breath, headaches, vision changes.  She is taking all of her blood pressure medications daily and not skipping any doses.  She does have an appointment with Nephrology but it is not until the end of the month.    I did advise the pt to call 911 or have someone take her to the ER if she starts experiencing any of the above symptoms or new symptoms.  She agreed.   Pt uses [facility].   Contact [phone]  Please advise.   Thanks,   -Mickel Baas

## 2018-03-03 ENCOUNTER — Ambulatory Visit (INDEPENDENT_AMBULATORY_CARE_PROVIDER_SITE_OTHER): Payer: Medicare HMO | Admitting: Physician Assistant

## 2018-03-03 ENCOUNTER — Encounter: Payer: Self-pay | Admitting: Physician Assistant

## 2018-03-03 VITALS — BP 165/90 | HR 57 | Temp 97.9°F | Resp 16 | Wt 195.0 lb

## 2018-03-03 DIAGNOSIS — I129 Hypertensive chronic kidney disease with stage 1 through stage 4 chronic kidney disease, or unspecified chronic kidney disease: Secondary | ICD-10-CM | POA: Diagnosis not present

## 2018-03-03 DIAGNOSIS — N183 Chronic kidney disease, stage 3 (moderate): Secondary | ICD-10-CM

## 2018-03-03 MED ORDER — CLONIDINE HCL 0.1 MG PO TABS: 0.1000 mg | ORAL_TABLET | Freq: Three times a day (TID) | ORAL | 3 refills | Status: DC

## 2018-03-03 NOTE — Progress Notes (Signed)
Patient: Miranda Olsen Female    DOB: 30-Dec-1940   78 y.o.   MRN: [id number] Visit Date: 03/03/2018  Today's Provider: Mar Daring, PA-C   Chief Complaint  Patient presents with  . Follow-up    Hypertension   Subjective:     HPI  Benign hypertension with CKD stage III: Patient here today for 4 week follow up. Patient was advised to continue amlodipine '5mg'$  (cannot tolerate '10mg'$  due to swelling), benazepril '20mg'$  BID, Metoprolol XR '50mg'$  and HCTZ '50mg'$ . Clonidine was added once daily. Referral placed to Nephrology for resistant HTN with CKD stage 3. Patient called the office on 02/28/18 with elevated BP. Was advised to increase clonidine to TID. Overall tolerating well and home BP this morning was 145/78. Patient reports that she is having side effect from the Clonidine, dry mouth. No chest pain,dizziness, sob.   Of note her baby sister passed on 04/02/63 from complications of pneumonia.   No Known Allergies   Current Outpatient Medications:  .  amLODipine (NORVASC) 5 MG tablet, TAKE 1 TABLET EVERY DAY, Disp: 90 tablet, Rfl: 1 .  benazepril (LOTENSIN) 20 MG tablet, TAKE 1 TABLET TWICE DAILY, Disp: 180 tablet, Rfl: 1 .  Blood Glucose Monitoring Suppl (ONE TOUCH ULTRA SYSTEM KIT) w/Device KIT, [place] blood sugar once per day, Disp: 1 each, Rfl: 0 .  cholecalciferol (VITAMIN D) 1000 UNITS tablet, Take 1,000 Units by mouth daily. , Disp: , Rfl:  .  cloNIDine (CATAPRES) 0.1 MG tablet, Take 1 tablet (0.1 mg total) by mouth daily., Disp: 90 tablet, Rfl: 3 .  cyanocobalamin 1000 MCG tablet, Take 1,000 mcg by mouth daily. , Disp: , Rfl:  .  ferrous sulfate 325 (65 FE) MG tablet, Take 1 tablet (325 mg total) by mouth daily., Disp: 90 tablet, Rfl: 1 .  glucose blood test strip, Check blood sugar once daily, Disp: 100 each, Rfl: 4 .  hydrochlorothiazide (HYDRODIURIL) 50 MG tablet, TAKE 1 TABLET EVERY DAY, Disp: 90 tablet, Rfl: 1 .  HYDROcodone-acetaminophen (NORCO/VICODIN) 5-325 MG  tablet, Take 1 tablet by mouth every 8 (eight) hours as needed for moderate pain., Disp: 30 tablet, Rfl: 0 .  Lancets (ONETOUCH ULTRASOFT) lancets, Check blood sugar once daily, Disp: 100 each, Rfl: 3 .  meloxicam (MOBIC) 7.5 MG tablet, , Disp: , Rfl:  .  metFORMIN (GLUCOPHAGE) 1000 MG tablet, TAKE 1 TABLET TWICE DAILY WITH MEALS, Disp: 180 tablet, Rfl: 1 .  metoprolol succinate (TOPROL-XL) 50 MG 24 hr tablet, Take 1 tablet (50 mg total) by mouth daily. Take with or immediately following a meal., Disp: 90 tablet, Rfl: 3 .  omeprazole (PRILOSEC) 40 MG capsule, TAKE 1 CAPSULE EVERY DAY, Disp: 90 capsule, Rfl: 1 .  pravastatin (PRAVACHOL) 10 MG tablet, Take 1 tablet (10 mg total) by mouth at bedtime. (Patient taking differently: Take 10 mg by mouth at bedtime. ), Disp: 90 tablet, Rfl: 1  Review of Systems  Constitutional: Negative.   Eyes: Negative for visual disturbance.  Respiratory: Negative for cough, chest tightness and shortness of breath.   Cardiovascular: Negative for chest pain and palpitations.  Neurological: Negative for dizziness, light-headedness and headaches.    Social History   Tobacco Use  . Smoking status: Never Smoker  . Smokeless tobacco: Never Used  Substance Use Topics  . Alcohol use: Yes    Comment: occasionally 1 glass of wine      Objective:   BP (!) 165/90 (BP Location: Left Arm, Patient  Position: Sitting, Cuff Size: Large)   Pulse (!) 57   Temp 97.9 F (36.6 C) (Oral)   Resp 16   Wt 195 lb (88.5 kg)   BMI 35.67 kg/m  Vitals:   03/03/18 0705  BP: (!) 165/90  Pulse: (!) 57  Resp: 16  Temp: 97.9 F (36.6 C)  TempSrc: Oral  Weight: 195 lb (88.5 kg)     Physical Exam Vitals signs reviewed.  Constitutional:      General: She is not in acute distress.    Appearance: She is well-developed. She is not diaphoretic.  Neck:     Musculoskeletal: Normal range of motion and neck supple.  Cardiovascular:     Rate and Rhythm: Normal rate and regular  rhythm.     Heart sounds: Normal heart sounds. No murmur. No friction rub. No gallop.   Pulmonary:     Effort: Pulmonary effort is normal. No respiratory distress.     Breath sounds: Normal breath sounds. No wheezing or rales.        Assessment & Plan    1. Benign hypertension with CKD (chronic kidney disease) stage III (HCC) Seemingly improved with home readings. Still elevated today in the office. Continue amlodipine '5mg'$  (dose dependent due to side effects), benazepril '20mg'$  BID, metoprolol '50mg'$  XR (dose dependent due to bradycardia) and clonidine 0.1 mg TID. Has appt with Nephrology, Dr. Candiss Norse, on 03/24/18. I will see her back in 2 months.  - cloNIDine (CATAPRES) 0.1 MG tablet; Take 1 tablet (0.1 mg total) by mouth 3 (three) times daily.  Dispense: 90 tablet; Refill: [place], PA-C  [facility]

## 2018-03-03 NOTE — Patient Instructions (Signed)
Biotene for dry mouth, can be bought OTC at pharmacy   [place] stands for "Dietary Approaches to Stop Hypertension." The DASH eating plan is a healthy eating plan that has been shown to reduce high blood pressure (hypertension). It may also reduce your risk for type 2 diabetes, heart disease, and stroke. The DASH eating plan may also help with weight loss. What are tips for following this plan?  General guidelines  Avoid eating more than 2,300 mg (milligrams) of salt (sodium) a day. If you have hypertension, you may need to reduce your sodium intake to 1,500 mg a day.  Limit alcohol intake to no more than 1 drink a day for nonpregnant women and 2 drinks a day for men. One drink equals 12 oz of beer, 5 oz of wine, or 1 oz of hard liquor.  Work with your health care provider to maintain a healthy body weight or to lose weight. Ask what an ideal weight is for you.  Get at least 30 minutes of exercise that causes your heart to beat faster (aerobic exercise) most days of the week. Activities may include walking, swimming, or biking.  Work with your health care provider or diet and nutrition specialist (dietitian) to adjust your eating plan to your individual calorie needs. Reading food labels   Check food labels for the amount of sodium per serving. Choose foods with less than 5 percent of the Daily Value of sodium. Generally, foods with less than 300 mg of sodium per serving fit into this eating plan.  To find whole grains, look for the word "whole" as the first word in the ingredient list. Shopping  Buy products labeled as "low-sodium" or "no salt added."  Buy fresh foods. Avoid canned foods and premade or frozen meals. Cooking  Avoid adding salt when cooking. Use salt-free seasonings or herbs instead of table salt or sea salt. Check with your health care provider or pharmacist before using salt substitutes.  Do not fry foods. Cook foods using healthy methods such as  baking, boiling, grilling, and broiling instead.  Cook with heart-healthy oils, such as olive, canola, soybean, or sunflower oil. Meal planning  Eat a balanced diet that includes: ? 5 or more servings of fruits and vegetables each day. At each meal, try to fill half of your plate with fruits and vegetables. ? Up to 6-8 servings of whole grains each day. ? Less than 6 oz of lean meat, poultry, or fish each day. A 3-oz serving of meat is about the same size as a deck of cards. One egg equals 1 oz. ? 2 servings of low-fat dairy each day. ? A serving of nuts, seeds, or beans 5 times each week. ? Heart-healthy fats. Healthy fats called Omega-3 fatty acids are found in foods such as flaxseeds and coldwater fish, like sardines, salmon, and mackerel.  Limit how much you eat of the following: ? Canned or prepackaged foods. ? Food that is high in trans fat, such as fried foods. ? Food that is high in saturated fat, such as fatty meat. ? Sweets, desserts, sugary drinks, and other foods with added sugar. ? Full-fat dairy products.  Do not salt foods before eating.  Try to eat at least 2 vegetarian meals each week.  Eat more home-cooked food and less restaurant, buffet, and fast food.  When eating at a restaurant, ask that your food be prepared with less salt or no salt, if possible. What foods are recommended? The items  listed may not be a complete list. Talk with your dietitian about what dietary choices are best for you. Grains Whole-grain or whole-wheat bread. Whole-grain or whole-wheat pasta. Brown rice. [place]. Bulgur. Whole-grain and low-sodium cereals. Pita bread. Low-fat, low-sodium crackers. Whole-wheat flour tortillas. Vegetables Fresh or frozen vegetables (raw, steamed, roasted, or grilled). Low-sodium or reduced-sodium tomato and vegetable juice. Low-sodium or reduced-sodium tomato sauce and tomato paste. Low-sodium or reduced-sodium canned vegetables. Fruits All fresh,  dried, or frozen fruit. Canned fruit in natural juice (without added sugar). Meat and other protein foods Skinless chicken or [place]. Ground chicken or [place]. Pork with fat trimmed off. Fish and seafood. Egg whites. Dried beans, peas, or lentils. Unsalted nuts, nut butters, and seeds. Unsalted canned beans. Lean cuts of beef with fat trimmed off. Low-sodium, lean deli meat. Dairy Low-fat (1%) or fat-free (skim) milk. Fat-free, low-fat, or reduced-fat cheeses. Nonfat, low-sodium ricotta or cottage cheese. Low-fat or nonfat yogurt. Low-fat, low-sodium cheese. Fats and oils Soft margarine without trans fats. Vegetable oil. Low-fat, reduced-fat, or light mayonnaise and salad dressings (reduced-sodium). Canola, safflower, olive, soybean, and sunflower oils. Avocado. Seasoning and other foods Herbs. Spices. Seasoning mixes without salt. Unsalted popcorn and pretzels. Fat-free sweets. What foods are not recommended? The items listed may not be a complete list. Talk with your dietitian about what dietary choices are best for you. Grains Baked goods made with fat, such as croissants, muffins, or some breads. Dry pasta or rice meal packs. Vegetables Creamed or fried vegetables. Vegetables in a cheese sauce. Regular canned vegetables (not low-sodium or reduced-sodium). Regular canned tomato sauce and paste (not low-sodium or reduced-sodium). Regular tomato and vegetable juice (not low-sodium or reduced-sodium). Angie Fava. Olives. Fruits Canned fruit in a light or heavy syrup. Fried fruit. Fruit in cream or butter sauce. Meat and other protein foods Fatty cuts of meat. Ribs. Fried meat. Berniece Salines. Sausage. Bologna and other processed lunch meats. Salami. Fatback. Hotdogs. Bratwurst. Salted nuts and seeds. Canned beans with added salt. Canned or smoked fish. Whole eggs or egg yolks. Chicken or [place] with skin. Dairy Whole or 2% milk, cream, and half-and-half. Whole or full-fat cream cheese. Whole-fat or sweetened  yogurt. Full-fat cheese. Nondairy creamers. Whipped toppings. Processed cheese and cheese spreads. Fats and oils Butter. Stick margarine. Lard. Shortening. Ghee. Bacon fat. Tropical oils, such as coconut, palm kernel, or palm oil. Seasoning and other foods Salted popcorn and pretzels. Onion salt, garlic salt, seasoned salt, table salt, and sea salt. Worcestershire sauce. Tartar sauce. Barbecue sauce. Teriyaki sauce. Soy sauce, including reduced-sodium. Steak sauce. Canned and packaged gravies. Fish sauce. Oyster sauce. Cocktail sauce. Horseradish that you find on the shelf. Ketchup. Mustard. Meat flavorings and tenderizers. Bouillon cubes. Hot sauce and Tabasco sauce. Premade or packaged marinades. Premade or packaged taco seasonings. Relishes. Regular salad dressings. Where to find more information:  National Heart, Lung, and [place]: https://wilson-eaton.com/  American Heart Association: www.heart.org Summary  The DASH eating plan is a healthy eating plan that has been shown to reduce high blood pressure (hypertension). It may also reduce your risk for type 2 diabetes, heart disease, and stroke.  With the DASH eating plan, you should limit salt (sodium) intake to 2,300 mg a day. If you have hypertension, you may need to reduce your sodium intake to 1,500 mg a day.  When on the DASH eating plan, aim to eat more fresh fruits and vegetables, whole grains, lean proteins, low-fat dairy, and heart-healthy fats.  Work with your health care provider or  diet and nutrition specialist (dietitian) to adjust your eating plan to your individual calorie needs. This information is not intended to replace advice given to you by your health care provider. Make sure you discuss any questions you have with your health care provider. Document Released: 12/31/2010 Document Revised: 01/05/2016 Document Reviewed: 01/05/2016 Elsevier Interactive Patient Education  2019 [employer].

## 2018-03-10 ENCOUNTER — Ambulatory Visit (INDEPENDENT_AMBULATORY_CARE_PROVIDER_SITE_OTHER): Payer: Medicare HMO

## 2018-03-10 VITALS — BP 138/74 | HR 61 | Temp 98.3°F | Ht 62.0 in | Wt 195.0 lb

## 2018-03-10 DIAGNOSIS — Z0000 Encounter for general adult medical examination without abnormal findings: Secondary | ICD-10-CM | POA: Diagnosis not present

## 2018-03-10 NOTE — Progress Notes (Signed)
Subjective:   Miranda Olsen is a 78 y.o. female who presents for Medicare Annual (Subsequent) preventive examination.  Review of Systems:  N/A  Cardiac Risk Factors include: advanced age (>97mn, >>41women);obesity (BMI >30kg/m2);hypertension;diabetes mellitus;dyslipidemia     Objective:     Vitals: BP 138/74 (BP Location: Left Arm)   Pulse 61   Temp 98.3 F (36.8 C) (Oral)   Ht '5\' 2"'$  (1.575 m)   Wt 195 lb (88.5 kg)   BMI 35.67 kg/m   Body mass index is 35.67 kg/m.  Advanced Directives 03/10/2018 02/24/2017 01/26/2015 12/19/2014  Does Patient Have a Medical Advance Directive? No No No No  Would patient like information on creating a medical advance directive? Yes (MAU/Ambulatory/Procedural Areas - Information given) No - Patient declined - -    Tobacco Social History   Tobacco Use  Smoking Status Never Smoker  Smokeless Tobacco Never Used     Counseling given: Not Answered   Clinical Intake:  Pre-visit preparation completed: Yes  Pain : No/denies pain Pain Score: 0-No pain    Diabetes:  Is the patient diabetic?  Yes type 2 If diabetic, was a CBG obtained today?  No  Did the patient bring in their glucometer from home?  No  How often do you monitor your CBG's? Does not.   Financial Strains and Diabetes Management:  Are you having any financial strains with the device, your supplies or your medication? No .  Does the patient want to be seen by Chronic Care Management for management of their diabetes?  No  Would the patient like to be referred to a Nutritionist or for Diabetic Management?  No   Diabetic Exams:  Diabetic Eye Exam: Completed 02/21/17. Overdue for diabetic eye exam. Pt has been advised about the importance in completing this exam. Pt to schedule eye exam this year.  Diabetic Foot Exam: Completed 03/03/17. Pt has been advised about the importance in completing this exam. Note made to f/u on this at next OV.   Nutritional Status: BMI > 30   Obese Nutritional Risks: None   How often do you need to have someone help you when you read instructions, pamphlets, or other written materials from your doctor or pharmacy?: 1 - Never  Interpreter Needed?: No  Information [facility] :: [facility]  Past Medical History:  Diagnosis Date  . [place] mellitus [place]   . Hypercholesterolemia   . Hypertension    Past Surgical History:  Procedure Laterality Date  . COLONOSCOPY WITH PROPOFOL N/A 01/27/2015   Procedure: COLONOSCOPY WITH PROPOFOL;  Surgeon: MJosefine Class MD;  [facility]  Service: Endoscopy;  Laterality: N/A;  . EYE SURGERY Left 01/21/2009    cataract surgery   . EYE SURGERY Right 04/08/2009   cataract surgery  . REPLACEMENT TOTAL KNEE Left 10/08/2009   Dr. HMarry Guan  Family History  Problem Relation Age of Onset  . Parkinsonism Mother   . Leukemia Father   . Cancer Sister        breast  . Breast cancer Sister 580 . Congestive Heart Failure Sister   . Congestive Heart Failure Sister   . Lung cancer Brother   . Breast cancer Maternal Aunt 60  . Breast cancer Other 40   Social History   Socioeconomic History  . Marital status: Widowed    Spouse name: Not on file  . Number of children: 2  . Years of education: H/S  . Highest education  level: 11th grade  Occupational History  . Occupation: Retired  Scientific laboratory technician  . Financial resource strain: Not hard at all  . Food insecurity:    Worry: Never true    Inability: Never true  . Transportation needs:    Medical: No    Non-medical: No  Tobacco Use  . Smoking status: Never Smoker  . Smokeless tobacco: Never Used  Substance and Sexual Activity  . Alcohol use: Yes    Comment: occasionally 1 glass of wine  . Drug use: No  . Sexual activity: Not on file  Lifestyle  . Physical activity:    Days per week: 0 days    Minutes per session: 0 min  . Stress: Only a little  Relationships  . Social connections:    Talks  on phone: Patient refused    Gets together: Patient refused    Attends religious service: Patient refused    Active member of club or organization: Patient refused    Attends meetings of clubs or organizations: Patient refused    Relationship status: Patient refused  Other Topics Concern  . Not on file  Social History Narrative  . Not on file    Outpatient Encounter Medications as of 03/10/2018  Medication Sig  . amLODipine (NORVASC) 5 MG tablet TAKE 1 TABLET EVERY DAY  . benazepril (LOTENSIN) 20 MG tablet TAKE 1 TABLET TWICE DAILY  . cholecalciferol (VITAMIN D) 1000 UNITS tablet Take 1,000 Units by mouth daily.   . cloNIDine (CATAPRES) 0.1 MG tablet Take 1 tablet (0.1 mg total) by mouth 3 (three) times daily.  . cyanocobalamin 1000 MCG tablet Take 1,000 mcg by mouth daily.   . hydrochlorothiazide (HYDRODIURIL) 50 MG tablet TAKE 1 TABLET EVERY DAY  . metFORMIN (GLUCOPHAGE) 1000 MG tablet TAKE 1 TABLET TWICE DAILY WITH MEALS  . metoprolol succinate (TOPROL-XL) 50 MG 24 hr tablet Take 1 tablet (50 mg total) by mouth daily. Take with or immediately following a meal.  . omeprazole (PRILOSEC) 40 MG capsule TAKE 1 CAPSULE EVERY DAY  . pravastatin (PRAVACHOL) 10 MG tablet Take 1 tablet (10 mg total) by mouth at bedtime. (Patient taking differently: Take 10 mg by mouth every other day. )  . Blood Glucose Monitoring Suppl (ONE TOUCH ULTRA SYSTEM KIT) w/Device KIT [place] blood sugar once per day (Patient not taking: Reported on 03/10/2018)  . ferrous sulfate 325 (65 FE) MG tablet Take 1 tablet (325 mg total) by mouth daily. (Patient not taking: Reported on 03/10/2018)  . glucose blood test strip Check blood sugar once daily (Patient not taking: Reported on 03/10/2018)  . HYDROcodone-acetaminophen (NORCO/VICODIN) 5-325 MG tablet Take 1 tablet by mouth every 8 (eight) hours as needed for moderate pain. (Patient not taking: Reported on 03/10/2018)  . Lancets (ONETOUCH ULTRASOFT) lancets Check blood sugar  once daily (Patient not taking: Reported on 03/10/2018)  . meloxicam (MOBIC) 7.5 MG tablet    No facility-administered encounter medications on file as of 03/10/2018.     Activities of Daily Living In your present state of health, do you have any difficulty performing the following activities: 03/10/2018  Hearing? Y  Comment Does not wear hearing aids.   Vision? N  Comment Wears eye glasses.   Difficulty concentrating or making decisions? N  Walking or climbing stairs? Y  Comment Due to back and knee pains.   Dressing or bathing? N  Doing errands, shopping? N  Preparing Food and eating ? N  Using the Toilet? N  In the  past six months, have you accidently leaked urine? Y  Comment Occasionally, does not wear protection at this time.   Do you have problems with loss of bowel control? N  Managing your Medications? N  Managing your Finances? N  Housekeeping or managing your Housekeeping? N  Some recent data might be hidden    Patient Care Team: Mar Daring, PA-C as PCP - General (Family Medicine) Birder Robson, MD as Referring Physician (Ophthalmology)    Assessment:   This is a routine wellness examination for Miranda Olsen.  Exercise Activities and Dietary recommendations Current Exercise Habits: The patient does not participate in regular exercise at present, Exercise limited by: orthopedic condition(s)  Goals    . DIET - REDUCE CARBOHYDRATE INTAKE     Recommend decreasing amount of carbohydrates consumed in daily diet.     . Have 3 meals a day     Recommend to decrease portion sizes by eating 3 small healthy meals and at least 2 healthy snacks per day.       Fall Risk Fall Risk  03/10/2018 02/24/2017 02/02/2016 02/07/2015 07/31/2014  Falls in the past year? 1 Yes No No No  Number falls in past yr: 0 1 - - -  Injury with Fall? 0 No - - -  Follow up Falls prevention discussed Falls prevention discussed - - -   FALL RISK PREVENTION PERTAINING TO THE HOME:  Any stairs  in or around the home WITH handrails? No  Home free of loose throw rugs in walkways, pet beds, electrical cords, etc? Yes  Adequate lighting in your home to reduce risk of falls? Yes   ASSISTIVE DEVICES UTILIZED TO PREVENT FALLS:  Life alert? No  Use of a cane, walker or w/c? No  Grab bars in the bathroom? No  Shower chair or bench in shower? No  Elevated toilet seat or a handicapped toilet? No    TIMED UP AND GO:  Was the test performed? No .    Depression Screen PHQ 2/9 Scores 03/10/2018 02/24/2017 02/24/2017 02/18/2016  PHQ - 2 Score 2 0 0 0  PHQ- 9 Score 5 3 - -     Cognitive Function     6CIT Screen 03/10/2018  What Year? 0 points  What month? 0 points  What time? 0 points  Count back from 20 0 points  Months in reverse 0 points  Repeat phrase 0 points  Total Score 0    Immunization History  Administered Date(s) Administered  . Pneumococcal Conjugate-13 10/03/2013  . Pneumococcal Polysaccharide-23 03/25/2011    Qualifies for Shingles Vaccine? Yes . Due for Shingrix. Education has been provided regarding the importance of this vaccine. Pt has been advised to call insurance company to determine out of pocket expense. Advised may also receive vaccine at local pharmacy or Health Dept. Verbalized acceptance and understanding.  Tdap: Although this vaccine is not a covered service during a Wellness Exam, does the patient still wish to receive this vaccine today?  No .  Education has been provided regarding the importance of this vaccine. Advised may receive this vaccine at local pharmacy or Health Dept. Aware to provide a copy of the vaccination record if obtained from local pharmacy or Health Dept. Verbalized acceptance and understanding.  Flu Vaccine: Due for Flu vaccine. Does the patient want to receive this vaccine today?  No . Education has been provided regarding the importance of this vaccine but still declined. Advised may receive this vaccine at local pharmacy or  Health Dept. Aware to provide a copy of the vaccination record if obtained from local pharmacy or Health Dept. Verbalized acceptance and understanding.  Pneumococcal Vaccine: Up to date   Screening Tests Health Maintenance  Topic Date Due  . OPHTHALMOLOGY EXAM  02/21/2018  . FOOT EXAM  03/03/2018  . INFLUENZA VACCINE  04/25/2018 (Originally 08/25/2017)  . TETANUS/TDAP  11/03/2018 (Originally [birth year]/1960)  . HEMOGLOBIN A1C  05/04/2018  . DEXA SCAN  11/21/2018  . PNA vac Low Risk Adult  Completed    Cancer Screenings:  Colorectal Screening: No longer required.   Mammogram: No longer required.   Bone Density: Completed 10/26/13. Results reflect NORMAL. Repeat every 5 years.  Lung Cancer Screening: (Low Dose CT Chest recommended if Age 24-80 years, 30 pack-year currently smoking OR have quit w/in 15years.) does not qualify.    Additional Screening:  Vision Screening: Recommended annual ophthalmology exams for early detection of glaucoma and other disorders of the eye.  Dental Screening: Recommended annual dental exams for proper oral hygiene  Community Resource Referral:  CRR required this visit?  No      Plan:  I have personally reviewed and addressed the Medicare Annual Wellness questionnaire and have noted the following in the patient's chart:  A. Medical and social history B. Use of alcohol, tobacco or illicit drugs  C. Current medications and supplements D. Functional ability and status E.  Nutritional status F.  Physical activity G. Advance directives H. List of other physicians I.  Hospitalizations, surgeries, and ER visits in previous 12 months J.  [place] such as hearing and vision if needed, cognitive and depression L. Referrals and appointments - none  In addition, I have reviewed and discussed with patient certain preventive protocols, quality metrics, and best practice recommendations. A written personalized care plan for preventive services as  well as general preventive health recommendations were provided to patient.  See attached scanned questionnaire for additional information.   Signed,  Fabio Neighbors, LPN Nurse Health Advisor   Nurse Recommendations: Pt needs a diabetic foot exam at next OV. Pt plans to set up an eye exam this year. Declined receiving the influenza and tetanus vaccines today.

## 2018-03-10 NOTE — Patient Instructions (Addendum)
Miranda Olsen , Thank you for taking time to come for your Medicare Wellness Visit. I appreciate your ongoing commitment to your health goals. Please review the following plan we discussed and let me know if I can assist you in the future.   Screening recommendations/referrals: Colonoscopy: No longer required.  Mammogram: No longer required.  Bone Density: Up to date, due 10/2018 Recommended yearly ophthalmology/optometry visit for glaucoma screening and checkup Recommended yearly dental visit for hygiene and checkup  Vaccinations: Influenza vaccine: Pt declines today.  Pneumococcal vaccine: Completed series Tdap vaccine: Pt declines today.  Shingles vaccine: Pt declines today.     Advanced directives: Advance directive discussed with you today. I have provided a copy for you to complete at home and have notarized. Once this is complete please bring a copy in to our office so we can scan it into your chart.  Conditions/risks identified: Obesity- recommend to decrease portion sizes by eating 3 small healthy meals and at least 2 healthy snacks per day to help aid in weight loss.   Next appointment: 05/05/18 @ 8:40 AM with Fenton Malling.    Preventive Care 77 Years and Older, Female Preventive care refers to lifestyle choices and visits with your health care provider that can promote health and wellness. What does preventive care include?  A yearly physical exam. This is also called an annual well check.  Dental exams once or twice a year.  Routine eye exams. Ask your health care provider how often you should have your eyes checked.  Personal lifestyle choices, including:  Daily care of your teeth and gums.  Regular physical activity.  Eating a healthy diet.  Avoiding tobacco and drug use.  Limiting alcohol use.  Practicing safe sex.  Taking low-dose aspirin every day.  Taking vitamin and mineral supplements as recommended by your health care provider. What happens  during an annual well check? The services and screenings done by your health care provider during your annual well check will depend on your age, overall health, lifestyle risk factors, and family history of disease. Counseling  Your health care provider may ask you questions about your:  Alcohol use.  Tobacco use.  Drug use.  Emotional well-being.  Home and relationship well-being.  Sexual activity.  Eating habits.  History of falls.  Memory and ability to understand (cognition).  Work and work Statistician.  Reproductive health. Screening  You may have the following tests or measurements:  Height, weight, and BMI.  Blood pressure.  Lipid and cholesterol levels. These may be checked every 5 years, or more frequently if you are over 44 years old.  Skin check.  Lung cancer screening. You may have this screening every year starting at age 77 if you have a 30-pack-year history of smoking and currently smoke or have quit within the past 15 years.  Fecal occult blood test (FOBT) of the stool. You may have this test every year starting at age 77.  Flexible sigmoidoscopy or colonoscopy. You may have a sigmoidoscopy every 5 years or a colonoscopy every 10 years starting at age 77.  Hepatitis C blood test.  Hepatitis B blood test.  Sexually transmitted disease (STD) testing.  Diabetes screening. This is done by checking your blood sugar (glucose) after you have not eaten for a while (fasting). You may have this done every 1-3 years.  Bone density scan. This is done to screen for osteoporosis. You may have this done starting at age 55.  Mammogram. This may be done  every 1-2 years. Talk to your health care provider about how often you should have regular mammograms. Talk with your health care provider about your test results, treatment options, and if necessary, the need for more tests. Vaccines  Your health care provider may recommend certain vaccines, such  as:  Influenza vaccine. This is recommended every year.  Tetanus, diphtheria, and acellular pertussis (Tdap, Td) vaccine. You may need a Td booster every 10 years.  Zoster vaccine. You may need this after age 65.  Pneumococcal 13-valent conjugate (PCV13) vaccine. One dose is recommended after age 77.  Pneumococcal polysaccharide (PPSV23) vaccine. One dose is recommended after age 77. Talk to your health care provider about which screenings and vaccines you need and how often you need them. This information is not intended to replace advice given to you by your health care provider. Make sure you discuss any questions you have with your health care provider. Document Released: 02/07/2015 Document Revised: 10/01/2015 Document Reviewed: 11/12/2014 Elsevier Interactive Patient Education  2017 [employer] Prevention in the Home Falls can cause injuries. They can happen to people of all ages. There are many things you can do to make your home safe and to help prevent falls. What can I do on the outside of my home?  Regularly fix the edges of walkways and driveways and fix any cracks.  Remove anything that might make you trip as you walk through a door, such as a raised step or threshold.  Trim any bushes or trees on the path to your home.  Use bright outdoor lighting.  Clear any walking paths of anything that might make someone trip, such as rocks or tools.  Regularly check to see if handrails are loose or broken. Make sure that both sides of any steps have handrails.  Any raised decks and porches should have guardrails on the edges.  Have any leaves, snow, or ice cleared regularly.  Use sand or salt on walking paths during winter.  Clean up any spills in your garage right away. This includes oil or grease spills. What can I do in the bathroom?  Use night lights.  Install grab bars by the toilet and in the tub and shower. Do not use towel bars as grab bars.  Use  non-skid mats or decals in the tub or shower.  If you need to sit down in the shower, use a plastic, non-slip stool.  Keep the floor dry. Clean up any water that spills on the floor as soon as it happens.  Remove soap buildup in the tub or shower regularly.  Attach bath mats securely with double-sided non-slip rug tape.  Do not have throw rugs and other things on the floor that can make you trip. What can I do in the bedroom?  Use night lights.  Make sure that you have a light by your bed that is easy to reach.  Do not use any sheets or blankets that are too big for your bed. They should not hang down onto the floor.  Have a firm chair that has side arms. You can use this for support while you get dressed.  Do not have throw rugs and other things on the floor that can make you trip. What can I do in the kitchen?  Clean up any spills right away.  Avoid walking on wet floors.  Keep items that you use a lot in easy-to-reach places.  If you need to reach something above you, use a  strong step stool that has a grab bar.  Keep electrical cords out of the way.  Do not use floor polish or wax that makes floors slippery. If you must use wax, use non-skid floor wax.  Do not have throw rugs and other things on the floor that can make you trip. What can I do with my stairs?  Do not leave any items on the stairs.  Make sure that there are handrails on both sides of the stairs and use them. Fix handrails that are broken or loose. Make sure that handrails are as long as the stairways.  Check any carpeting to make sure that it is firmly attached to the stairs. Fix any carpet that is loose or worn.  Avoid having throw rugs at the top or bottom of the stairs. If you do have throw rugs, attach them to the floor with carpet tape.  Make sure that you have a light switch at the top of the stairs and the bottom of the stairs. If you do not have them, ask someone to add them for you. What  else can I do to help prevent falls?  Wear shoes that:  Do not have high heels.  Have rubber bottoms.  Are comfortable and fit you well.  Are closed at the toe. Do not wear sandals.  If you use a stepladder:  Make sure that it is fully opened. Do not climb a closed stepladder.  Make sure that both sides of the stepladder are locked into place.  Ask someone to hold it for you, if possible.  Clearly mark and make sure that you can see:  Any grab bars or handrails.  First and last steps.  Where the edge of each step is.  Use tools that help you move around (mobility aids) if they are needed. These include:  Canes.  Walkers.  Scooters.  Crutches.  Turn on the lights when you go into a dark area. Replace any light bulbs as soon as they burn out.  Set up your furniture so you have a clear path. Avoid moving your furniture around.  If any of your floors are uneven, fix them.  If there are any pets around you, be aware of where they are.  Review your medicines with your doctor. Some medicines can make you feel dizzy. This can increase your chance of falling. Ask your doctor what other things that you can do to help prevent falls. This information is not intended to replace advice given to you by your health care provider. Make sure you discuss any questions you have with your health care provider. Document Released: 11/07/2008 Document Revised: 06/19/2015 Document Reviewed: 02/15/2014 Elsevier Interactive Patient Education  2017 [employer].

## 2018-03-22 ENCOUNTER — Other Ambulatory Visit: Payer: Self-pay | Admitting: Physician Assistant

## 2018-03-22 DIAGNOSIS — E119 Type 2 diabetes mellitus without complications: Secondary | ICD-10-CM

## 2018-03-24 DIAGNOSIS — R600 Localized edema: Secondary | ICD-10-CM | POA: Diagnosis not present

## 2018-03-24 DIAGNOSIS — R601 Generalized edema: Secondary | ICD-10-CM | POA: Diagnosis not present

## 2018-03-24 DIAGNOSIS — N183 Chronic kidney disease, stage 3 (moderate): Secondary | ICD-10-CM | POA: Diagnosis not present

## 2018-03-24 DIAGNOSIS — I129 Hypertensive chronic kidney disease with stage 1 through stage 4 chronic kidney disease, or unspecified chronic kidney disease: Secondary | ICD-10-CM | POA: Diagnosis not present

## 2018-03-27 ENCOUNTER — Other Ambulatory Visit: Payer: Self-pay | Admitting: Physician Assistant

## 2018-03-27 DIAGNOSIS — I129 Hypertensive chronic kidney disease with stage 1 through stage 4 chronic kidney disease, or unspecified chronic kidney disease: Secondary | ICD-10-CM

## 2018-03-27 DIAGNOSIS — N183 Chronic kidney disease, stage 3 (moderate): Principal | ICD-10-CM

## 2018-03-27 MED ORDER — CLONIDINE HCL 0.1 MG PO TABS: 0.1000 mg | ORAL_TABLET | Freq: Three times a day (TID) | ORAL | 3 refills | Status: DC

## 2018-03-27 NOTE — Telephone Encounter (Signed)
Pt needing a refill on:  cloNIDine (CATAPRES) 0.1 MG tablet  Please fill with:  [place], [place] [phone] (Phone) [phone] (Fax)   Thanks, [facility]

## 2018-03-31 ENCOUNTER — Ambulatory Visit
Admission: RE | Admit: 2018-03-31 | Discharge: 2018-03-31 | Disposition: A | Discharge status: Home / Self Care | Payer: Medicare HMO | Source: Ambulatory Visit | Attending: Physician Assistant | Admitting: Physician Assistant

## 2018-03-31 ENCOUNTER — Telehealth: Payer: Self-pay

## 2018-03-31 DIAGNOSIS — Z1231 Encounter for screening mammogram for malignant neoplasm of breast: Secondary | ICD-10-CM | POA: Insufficient documentation

## 2018-03-31 NOTE — Telephone Encounter (Signed)
Patient advised as below.  

## 2018-03-31 NOTE — Telephone Encounter (Signed)
-----   Message from Mar Daring, [place] sent at 03/31/2018  5:03 PM EST ----- Normal mammogram. Repeat screening in one year.

## 2018-04-01 ENCOUNTER — Encounter: Payer: Self-pay | Admitting: Emergency Medicine

## 2018-04-01 ENCOUNTER — Emergency Department
Admission: EM | Admit: 2018-04-01 | Discharge: 2018-04-01 | Disposition: A | Discharge status: Home / Self Care | Payer: Medicare HMO | Attending: Emergency Medicine | Admitting: Emergency Medicine

## 2018-04-01 ENCOUNTER — Other Ambulatory Visit: Payer: Self-pay

## 2018-04-01 ENCOUNTER — Emergency Department: Payer: Medicare HMO

## 2018-04-01 DIAGNOSIS — E1122 Type 2 diabetes mellitus with diabetic chronic kidney disease: Secondary | ICD-10-CM | POA: Insufficient documentation

## 2018-04-01 DIAGNOSIS — K5730 Diverticulosis of large intestine without perforation or abscess without bleeding: Secondary | ICD-10-CM | POA: Diagnosis not present

## 2018-04-01 DIAGNOSIS — Z79899 Other long term (current) drug therapy: Secondary | ICD-10-CM | POA: Diagnosis not present

## 2018-04-01 DIAGNOSIS — R1030 Lower abdominal pain, unspecified: Secondary | ICD-10-CM | POA: Diagnosis present

## 2018-04-01 DIAGNOSIS — Z96652 Presence of left artificial knee joint: Secondary | ICD-10-CM | POA: Insufficient documentation

## 2018-04-01 DIAGNOSIS — N183 Chronic kidney disease, stage 3 (moderate): Secondary | ICD-10-CM | POA: Insufficient documentation

## 2018-04-01 DIAGNOSIS — K922 Gastrointestinal hemorrhage, unspecified: Secondary | ICD-10-CM | POA: Diagnosis not present

## 2018-04-01 DIAGNOSIS — I129 Hypertensive chronic kidney disease with stage 1 through stage 4 chronic kidney disease, or unspecified chronic kidney disease: Secondary | ICD-10-CM | POA: Diagnosis not present

## 2018-04-01 DIAGNOSIS — K921 Melena: Secondary | ICD-10-CM | POA: Insufficient documentation

## 2018-04-01 DIAGNOSIS — K449 Diaphragmatic hernia without obstruction or gangrene: Secondary | ICD-10-CM | POA: Diagnosis not present

## 2018-04-01 DIAGNOSIS — Z7984 Long term (current) use of oral hypoglycemic drugs: Secondary | ICD-10-CM | POA: Insufficient documentation

## 2018-04-01 LAB — CBC WITH DIFFERENTIAL/PLATELET
Abs Immature Granulocytes: 0.03 10*3/uL (ref 0.00–0.07)
Basophils Absolute: 0 10*3/uL (ref 0.0–0.1)
Basophils Relative: 0 %
EOS PCT: 0 %
Eosinophils Absolute: 0 10*3/uL (ref 0.0–0.5)
HCT: 32 % — ABNORMAL LOW (ref 36.0–46.0)
Hemoglobin: 10.5 g/dL — ABNORMAL LOW (ref 12.0–15.0)
Immature Granulocytes: 0 %
Lymphocytes Relative: 18 %
Lymphs Abs: 1.6 10*3/uL (ref 0.7–4.0)
MCH: 28.6 pg (ref 26.0–34.0)
MCHC: 32.8 g/dL (ref 30.0–36.0)
MCV: 87.2 fL (ref 80.0–100.0)
Monocytes Absolute: 0.5 10*3/uL (ref 0.1–1.0)
Monocytes Relative: 5 %
Neutro Abs: 6.8 10*3/uL (ref 1.7–7.7)
Neutrophils Relative %: 77 %
Platelets: 187 10*3/uL (ref 150–400)
RBC: 3.67 MIL/uL — ABNORMAL LOW (ref 3.87–5.11)
RDW: 12.8 % (ref 11.5–15.5)
WBC: 8.9 10*3/uL (ref 4.0–10.5)
nRBC: 0 % (ref 0.0–0.2)

## 2018-04-01 LAB — CBC
HCT: 33.8 % — ABNORMAL LOW (ref 36.0–46.0)
Hemoglobin: 10.9 g/dL — ABNORMAL LOW (ref 12.0–15.0)
MCH: 28.8 pg (ref 26.0–34.0)
MCHC: 32.2 g/dL (ref 30.0–36.0)
MCV: 89.2 fL (ref 80.0–100.0)
Platelets: 258 10*3/uL (ref 150–400)
RBC: 3.79 MIL/uL — ABNORMAL LOW (ref 3.87–5.11)
RDW: 13 % (ref 11.5–15.5)
WBC: 9 10*3/uL (ref 4.0–10.5)
nRBC: 0 % (ref 0.0–0.2)

## 2018-04-01 LAB — COMPREHENSIVE METABOLIC PANEL
ALT: 10 U/L (ref 0–44)
AST: 19 U/L (ref 15–41)
Albumin: 4 g/dL (ref 3.5–5.0)
Alkaline Phosphatase: 39 U/L (ref 38–126)
Anion gap: 10 (ref 5–15)
BILIRUBIN TOTAL: 0.7 mg/dL (ref 0.3–1.2)
BUN: 34 mg/dL — ABNORMAL HIGH (ref 8–23)
CO2: 24 mmol/L (ref 22–32)
Calcium: 9 mg/dL (ref 8.9–10.3)
Chloride: 104 mmol/L (ref 98–111)
Creatinine, Ser: 1.58 mg/dL — ABNORMAL HIGH (ref 0.44–1.00)
GFR calc Af Amer: 36 mL/min — ABNORMAL LOW (ref >60)
GFR, EST NON AFRICAN AMERICAN: 31 mL/min — AB (ref >60)
Glucose, Bld: 180 mg/dL — ABNORMAL HIGH (ref 70–99)
Potassium: 4.7 mmol/L (ref 3.5–5.1)
Sodium: 138 mmol/L (ref 135–145)
Total Protein: 7.4 g/dL (ref 6.5–8.1)

## 2018-04-01 LAB — URINALYSIS, COMPLETE (UACMP) WITH MICROSCOPIC
BACTERIA UA: NONE SEEN
Bilirubin Urine: NEGATIVE
Glucose, UA: NEGATIVE mg/dL
Ketones, ur: NEGATIVE mg/dL
Leukocytes,Ua: NEGATIVE
Nitrite: NEGATIVE
Protein, ur: NEGATIVE mg/dL
SPECIFIC GRAVITY, URINE: 1.006 (ref 1.005–1.030)
WBC, UA: NONE SEEN WBC/hpf (ref 0–5)
pH: 5 (ref 5.0–8.0)

## 2018-04-01 LAB — LIPASE, BLOOD: Lipase: 45 U/L (ref 11–51)

## 2018-04-01 MED ORDER — IOHEXOL 300 MG/ML  SOLN
75.0000 mL | Freq: Once | INTRAMUSCULAR | Status: AC | PRN
  Administered 2018-04-01: 75 mL via INTRAVENOUS

## 2018-04-01 MED ORDER — IOPAMIDOL (ISOVUE-300) INJECTION 61%
30.0000 mL | Freq: Once | INTRAVENOUS | Status: AC | PRN
  Administered 2018-04-01: 30 mL via ORAL

## 2018-04-01 MED ORDER — SODIUM CHLORIDE 0.9% FLUSH: 3.0000 mL | Freq: Once | INTRAVENOUS | Status: DC

## 2018-04-01 NOTE — ED Provider Notes (Signed)
[facility] Emergency Department Provider Note ____________________________________________   First MD Initiated Contact with Patient 04/01/18 1002     (approximate)  I have reviewed the triage vital signs and the nursing notes.   HISTORY  Chief Complaint Abdominal Pain    HPI Miranda Olsen is a 78 y.o. female with PMH as noted below as well as a history of diverticulosis with lower GI bleed in 2017 who presents with lower abdominal pain, mainly on the left side, and intermittent course over the last several days.  She is not currently having pain.  The patient reports blood in her stool today, occurring over 3 episodes and associated with loose stool.  She describes the blood is dark.  She states it is similar to when she had bleeding from diverticulosis a few years ago.  She states that she briefly felt lightheaded but this is also resolved.  She denies any nausea or vomiting.  Past Medical History:  Diagnosis Date  . Diabetes mellitus without complication ([place])   . Hypercholesterolemia   . Hypertension     Patient Active Problem List   Diagnosis Date Noted  . Low back pain 07/11/2015  . Acute blood loss anemia 01/25/2015  . Chronic kidney disease (CKD), stage III (moderate) ([place]) 07/31/2014  . Dysfunction of eustachian tube 05/30/2014  . Acid reflux 05/30/2014  . Burning or prickling sensation 05/30/2014  . Deficiency of vitamin B 07/05/2008  . Anemia, deficiency 06/26/2008  . Avitaminosis D 06/26/2008  . Benign hypertension with CKD (chronic kidney disease) stage III ([place]) 06/15/2008  . Hypercholesteremia 06/12/2008  . Diabetes mellitus without complication ([place]) 63/84/6659  . Colon, diverticulosis 06/06/2008  . Apnea, sleep 01/26/2007  . History of colon polyps 08/09/2006    Past Surgical History:  Procedure Laterality Date  . COLONOSCOPY WITH PROPOFOL N/A 01/27/2015   Procedure: COLONOSCOPY WITH PROPOFOL;  Surgeon: Josefine Class,  MD;  Location: [facility] ENDOSCOPY;  Service: Endoscopy;  Laterality: N/A;  . EYE SURGERY Left 01/21/2009    cataract surgery   . EYE SURGERY Right 04/08/2009   cataract surgery  . REPLACEMENT TOTAL KNEE Left 10/08/2009   Dr. Marry Guan    Prior to Admission medications   Medication Sig Start Date End Date Taking? Authorizing Provider  amLODipine (NORVASC) 5 MG tablet TAKE 1 TABLET EVERY DAY 12/14/17  Yes Fenton Malling M, PA-C  benazepril (LOTENSIN) 20 MG tablet TAKE 1 TABLET TWICE DAILY 08/24/17  Yes Mar Daring, PA-C  cholecalciferol (VITAMIN D) 1000 UNITS tablet Take 1,000 Units by mouth daily.  03/25/12  Yes [provider]  cloNIDine (CATAPRES) 0.1 MG tablet Take 1 tablet (0.1 mg total) by mouth 3 (three) times daily. 03/27/18  Yes Mar Daring, PA-C  cyanocobalamin 1000 MCG tablet Take 1,000 mcg by mouth daily.    Yes [provider]  metFORMIN (GLUCOPHAGE) 1000 MG tablet TAKE 1 TABLET TWICE DAILY WITH MEALS 03/23/18  Yes Fenton Malling M, PA-C  metoprolol succinate (TOPROL-XL) 50 MG 24 hr tablet Take 1 tablet (50 mg total) by mouth daily. Take with or immediately following a meal. 01/31/18  Yes Mar Daring, PA-C  omeprazole (PRILOSEC) 40 MG capsule TAKE 1 CAPSULE EVERY DAY 12/14/17  Yes Fenton Malling M, PA-C  pravastatin (PRAVACHOL) 10 MG tablet Take 1 tablet (10 mg total) by mouth at bedtime. Patient taking differently: Take 10 mg by mouth every other day.  05/20/15  Yes Margarita Rana, MD  torsemide (DEMADEX) 10 MG tablet  Take 10 mg by mouth every morning. 03/24/18  Yes [provider]  Blood Glucose Monitoring Suppl (ONE TOUCH ULTRA SYSTEM KIT) w/Device KIT [place] blood sugar once per day Patient not taking: Reported on 03/10/2018 02/24/16   Mar Daring, PA-C  ferrous sulfate 325 (65 FE) MG tablet Take 1 tablet (325 mg total) by mouth daily. Patient not taking: Reported on 03/10/2018 10/24/17   Mar Daring, PA-C  glucose  blood test strip Check blood sugar once daily Patient not taking: Reported on 03/10/2018 02/24/16   Mar Daring, PA-C  hydrochlorothiazide (HYDRODIURIL) 50 MG tablet TAKE 1 TABLET EVERY DAY Patient not taking: Reported on 04/01/2018 09/27/17   Mar Daring, PA-C  HYDROcodone-acetaminophen (NORCO/VICODIN) 5-325 MG tablet Take 1 tablet by mouth every 8 (eight) hours as needed for moderate pain. Patient not taking: Reported on 03/10/2018 07/18/17   Mar Daring, PA-C  Lancets [facility] ULTRASOFT) lancets Check blood sugar once daily Patient not taking: Reported on 03/10/2018 02/24/16   Mar Daring, PA-C    Allergies Patient has no known allergies.  Family History  Problem Relation Age of Onset  . Parkinsonism Mother   . Leukemia Father   . Cancer Sister        breast  . Breast cancer Sister 46  . Congestive Heart Failure Sister   . Congestive Heart Failure Sister   . Lung cancer Brother   . Breast cancer Maternal Aunt 60  . Breast cancer Other 35    Social History Social History   Tobacco Use  . Smoking status: Never Smoker  . Smokeless tobacco: Never Used  Substance Use Topics  . Alcohol use: Yes    Comment: occasionally 1 glass of wine  . Drug use: No    Review of Systems  Constitutional: No fever. Eyes: No redness. ENT: No sore throat. Cardiovascular: Denies chest pain. Respiratory: Denies shortness of breath. Gastrointestinal: Positive for loose stool and blood in the stool. Genitourinary: Negative for dysuria.  Musculoskeletal: Negative for back pain. Skin: Negative for rash. Neurological: Negative for headache.   ____________________________________________   PHYSICAL EXAM:  VITAL SIGNS: ED Triage Vitals  Enc Vitals Group     BP 04/01/18 0956 (!) 136/100     Pulse Rate 04/01/18 0956 83     Resp 04/01/18 0956 20     Temp 04/01/18 0956 98.1 F (36.7 C)     Temp Source 04/01/18 0955 Oral     SpO2 04/01/18 0956 99 %     Weight  04/01/18 0957 195 lb (88.5 kg)     Height 04/01/18 0957 5' 2" (1.575 m)     Head Circumference --      Peak Flow --      Pain Score 04/01/18 0956 3     Pain Loc --      Pain Edu? --      Excl. in [place]? --     Constitutional: Alert and oriented. Well appearing and in no acute distress. Eyes: Conjunctivae are normal.  No scleral icterus. Head: Atraumatic. Nose: No congestion/rhinnorhea. Mouth/Throat: Mucous membranes are moist.   Neck: Normal range of motion.  Cardiovascular: Good peripheral circulation. Respiratory: Normal respiratory effort.   Gastrointestinal: Soft and nontender. No distention.  Genitourinary: No flank tenderness. Musculoskeletal: Extremities warm and well perfused.  Neurologic:  Normal speech and language. No gross focal neurologic deficits are appreciated.  Skin:  Skin is warm and dry. No rash noted. Psychiatric: Mood and affect are normal.  Speech and behavior are normal.  ____________________________________________   LABS (all labs ordered are listed, but only abnormal results are displayed)  Labs Reviewed  COMPREHENSIVE METABOLIC PANEL - Abnormal; Notable for the following components:      Result Value   Glucose, Bld 180 (*)    BUN 34 (*)    Creatinine, Ser 1.58 (*)    GFR calc non Af Amer 31 (*)    GFR calc Af Amer 36 (*)    All other components within normal limits  CBC - Abnormal; Notable for the following components:   RBC 3.79 (*)    Hemoglobin 10.9 (*)    HCT 33.8 (*)    All other components within normal limits  URINALYSIS, COMPLETE (UACMP) WITH MICROSCOPIC - Abnormal; Notable for the following components:   Color, Urine STRAW (*)    APPearance CLEAR (*)    Hgb urine dipstick SMALL (*)    All other components within normal limits  CBC WITH DIFFERENTIAL/PLATELET - Abnormal; Notable for the following components:   RBC 3.67 (*)    Hemoglobin 10.5 (*)    HCT 32.0 (*)    All other components within normal limits  LIPASE, BLOOD    ____________________________________________  EKG   ____________________________________________  RADIOLOGY  CT abdomen: Diverticulosis with no acute abnormalities.  ____________________________________________   PROCEDURES  Procedure(s) performed: No  Procedures  Critical Care performed: No ____________________________________________   INITIAL IMPRESSION / ASSESSMENT AND PLAN / ED COURSE  Pertinent labs & imaging results that were available during my care of the patient were reviewed by me and considered in my medical decision making (see chart for details).  78 year old female with PMH as noted above presents with intermittent lower abdominal pain over the last several days as well as with 3 episodes of loose stools with dark red blood this morning.  She states the bleeding is similar to when she had an episode of bleeding related to diverticulosis several years ago.  I reviewed the past medical records in Epic; the patient was admitted in December 2016 with acute diverticular bleeding with initially stable hemoglobin but then further bleeding in the ED.  She had bleeding scan and colonoscopy and did require a blood transfusion during that stay.  On exam currently, the patient is well-appearing and her vital signs are normal.  The abdomen is soft and nontender.  The remainder of the exam is unremarkable.  Overall the presentation is most consistent with recurrent diverticular bleeding.  Given the report of abdominal pain, I will obtain a CT to rule out diverticulitis or colitis.  The patient has expressed a strong desire to go home if at all possible.  I will plan to observe her in the ED for least 4 to 6 hours and get a repeat hemoglobin.  If her hemoglobin is stable and she has no recurrent bleeding she may be stable for discharge home.  ----------------------------------------- 2:55 PM on 04/01/2018 -----------------------------------------  CT showed diverticulosis  with no acute findings.  The patient's initial hemoglobin was 10.9, which appears to be her baseline and is unchanged from 5 months ago.  We observe the patient in the ED for a total of 5 hours.  She had no recurrence of the bleeding or lightheadedness in the ED.  Repeat hemoglobin after 4 hours shows no significant change.  Her vital signs remained stable.  The patient is adamant that she would like to be discharged home.  I did offer her admission for further observation and  serial hemoglobins, but she declined.  She understands that the bleeding could recur and could potentially be life-threatening or require a transfusion.  At this time she is stable for discharge home.  I had a thorough discussion of the results of the work-up and about return precautions, and she expressed understanding.  We will provide GI referral and she will follow-up with her primary care doctor as well.  ____________________________________________   FINAL CLINICAL IMPRESSION(S) / ED DIAGNOSES  Final diagnoses:  Lower GI bleeding      NEW MEDICATIONS STARTED DURING THIS VISIT:  New Prescriptions   No medications on file     Note:  This document was prepared using Dragon voice recognition software and may include unintentional dictation errors.    Arta Silence, MD 04/01/18 [id number]

## 2018-04-01 NOTE — ED Triage Notes (Signed)
Abdominal pain x 6 days, rectal bleeding today.

## 2018-04-01 NOTE — ED Notes (Signed)
Patient transported to CT 

## 2018-04-01 NOTE — Discharge Instructions (Addendum)
Return to the emergency department IMMEDIATELY for new, worsening, recurrent bleeding, abdominal pain, weakness or lightheadedness, or any other new or worsening symptoms that concern you.  We have provided information for 1 of our local gastroenterologist.  You should make an appointment to follow-up as soon as possible.  You should also follow-up with your primary care doctor.

## 2018-04-04 ENCOUNTER — Telehealth: Payer: Self-pay | Admitting: Physician Assistant

## 2018-04-04 DIAGNOSIS — N183 Chronic kidney disease, stage 3 (moderate): Principal | ICD-10-CM

## 2018-04-04 DIAGNOSIS — I129 Hypertensive chronic kidney disease with stage 1 through stage 4 chronic kidney disease, or unspecified chronic kidney disease: Secondary | ICD-10-CM

## 2018-04-04 MED ORDER — CLONIDINE HCL 0.1 MG PO TABS: 0.1000 mg | ORAL_TABLET | Freq: Three times a day (TID) | ORAL | 0 refills | Status: DC

## 2018-04-04 NOTE — Telephone Encounter (Signed)
Please review. KW 

## 2018-04-04 NOTE — Telephone Encounter (Signed)
Sent in 90 tablets to [facility].

## 2018-04-04 NOTE — Telephone Encounter (Signed)
Pt has been with out cloNIDine (CATAPRES) 0.1 MG tablet for 1 week. She was expecting the Rx to be called into [facility]  Needing a emergency amount called into [facility] and her regular to be called into [facility].  Please advise.  Thanks, [employer]

## 2018-04-05 ENCOUNTER — Other Ambulatory Visit: Payer: Self-pay | Admitting: Physician Assistant

## 2018-04-05 DIAGNOSIS — I129 Hypertensive chronic kidney disease with stage 1 through stage 4 chronic kidney disease, or unspecified chronic kidney disease: Secondary | ICD-10-CM

## 2018-04-05 DIAGNOSIS — N183 Chronic kidney disease, stage 3 (moderate): Principal | ICD-10-CM

## 2018-04-05 MED ORDER — CLONIDINE HCL 0.1 MG PO TABS: 0.1000 mg | ORAL_TABLET | Freq: Three times a day (TID) | ORAL | 0 refills | Status: DC

## 2018-04-05 NOTE — Telephone Encounter (Signed)
Pt advised the rx sent to [facility] and to [facility]  dbs

## 2018-04-05 NOTE — Telephone Encounter (Signed)
Unable to reach patient at this time. KW

## 2018-04-12 ENCOUNTER — Other Ambulatory Visit: Payer: Self-pay | Admitting: Physician Assistant

## 2018-04-12 DIAGNOSIS — N183 Chronic kidney disease, stage 3 unspecified: Secondary | ICD-10-CM

## 2018-04-12 DIAGNOSIS — I129 Hypertensive chronic kidney disease with stage 1 through stage 4 chronic kidney disease, or unspecified chronic kidney disease: Secondary | ICD-10-CM

## 2018-04-19 ENCOUNTER — Other Ambulatory Visit: Payer: Self-pay | Admitting: Physician Assistant

## 2018-04-19 DIAGNOSIS — D539 Nutritional anemia, unspecified: Secondary | ICD-10-CM

## 2018-04-29 ENCOUNTER — Other Ambulatory Visit: Payer: Self-pay | Admitting: Physician Assistant

## 2018-04-29 DIAGNOSIS — N183 Chronic kidney disease, stage 3 unspecified: Secondary | ICD-10-CM

## 2018-04-29 DIAGNOSIS — I129 Hypertensive chronic kidney disease with stage 1 through stage 4 chronic kidney disease, or unspecified chronic kidney disease: Secondary | ICD-10-CM

## 2018-05-03 ENCOUNTER — Ambulatory Visit: Payer: Self-pay | Admitting: Physician Assistant

## 2018-05-04 ENCOUNTER — Other Ambulatory Visit: Payer: Self-pay

## 2018-05-04 ENCOUNTER — Ambulatory Visit (INDEPENDENT_AMBULATORY_CARE_PROVIDER_SITE_OTHER): Payer: Medicare HMO | Admitting: Physician Assistant

## 2018-05-04 ENCOUNTER — Encounter: Payer: Self-pay | Admitting: Physician Assistant

## 2018-05-04 VITALS — BP 163/84 | HR 64 | Temp 97.9°F | Resp 16 | Wt 188.0 lb

## 2018-05-04 DIAGNOSIS — I129 Hypertensive chronic kidney disease with stage 1 through stage 4 chronic kidney disease, or unspecified chronic kidney disease: Secondary | ICD-10-CM | POA: Diagnosis not present

## 2018-05-04 DIAGNOSIS — N183 Chronic kidney disease, stage 3 unspecified: Secondary | ICD-10-CM

## 2018-05-04 DIAGNOSIS — E7800 Pure hypercholesterolemia, unspecified: Secondary | ICD-10-CM | POA: Diagnosis not present

## 2018-05-04 DIAGNOSIS — E1122 Type 2 diabetes mellitus with diabetic chronic kidney disease: Secondary | ICD-10-CM

## 2018-05-04 DIAGNOSIS — I10 Essential (primary) hypertension: Secondary | ICD-10-CM

## 2018-05-04 MED ORDER — PRAVASTATIN SODIUM 10 MG PO TABS: 10.0000 mg | ORAL_TABLET | ORAL | 1 refills | Status: DC

## 2018-05-04 MED ORDER — AMLODIPINE BESYLATE 5 MG PO TABS: 5.0000 mg | ORAL_TABLET | Freq: Every day | ORAL | 1 refills | Status: DC

## 2018-05-04 MED ORDER — CLONIDINE HCL 0.1 MG PO TABS: 0.1000 mg | ORAL_TABLET | Freq: Two times a day (BID) | ORAL | 1 refills | Status: DC

## 2018-05-04 MED ORDER — BENAZEPRIL HCL 40 MG PO TABS: 40.0000 mg | ORAL_TABLET | Freq: Every day | ORAL | 1 refills | Status: DC

## 2018-05-04 NOTE — Progress Notes (Signed)
Patient: Miranda Olsen Female    DOB: 11/28/1940   78 y.o.   MRN: [id number] Visit Date: 05/04/2018  Today's Provider: Mar Daring, PA-C   Chief Complaint  Patient presents with  . Follow-up    HTN   Subjective:     HPI   Hypertension with CKD stage III, follow-up:  BP Readings from Last 3 Encounters:  05/04/18 (!) 163/84  04/01/18 (!) 151/98  03/10/18 138/74    She was last seen for hypertension 2 months ago.  BP at that visit was 165/96. Management since that visit includes Continue amlodipine 23m (dose dependent due to side effects), benazepril 239mBID, metoprolol 5027mR (dose dependent due to bradycardia) and clonidine 0.1 mg TID She reports good compliance with treatment. She is experiencing some swelling on her ankles.  Patient denies chest pain, chest pressure/discomfort, exertional chest pressure/discomfort, fatigue, irregular heart beat, lower extremity edema, near-syncope and palpitations.   Cardiovascular risk factors include advanced age (older than 55 32r men, 65 58r women), diabetes mellitus, dyslipidemia and hypertension.     Weight trend: decreasing steadily Wt Readings from Last 3 Encounters:  05/04/18 188 lb (85.3 kg)  04/01/18 195 lb (88.5 kg)  03/10/18 195 lb (88.5 kg)    Current diet: well balanced  ------------------------------------------------------------------------ Patient was also seen at ED  For GI Bleeding on 04/01/2018. Patient reports that she is doing much better. No blood in stool since. Has had diverticular bleed in 2016 and knew the symptoms felt similar so she went to the ER.   No Known Allergies   Current Outpatient Medications:  .  amLODipine (NORVASC) 5 MG tablet, TAKE 1 TABLET EVERY DAY, Disp: 90 tablet, Rfl: 1 .  benazepril (LOTENSIN) 20 MG tablet, TAKE 1 TABLET TWICE DAILY, Disp: 180 tablet, Rfl: 1 .  cholecalciferol (VITAMIN D) 1000 UNITS tablet, Take 1,000 Units by mouth daily. , Disp: , Rfl:  .   cloNIDine (CATAPRES) 0.1 MG tablet, TAKE 1 TABLET THREE TIMES DAILY (Patient taking differently: No sig reported), Disp: 270 tablet, Rfl: 1 .  cyanocobalamin 1000 MCG tablet, Take 1,000 mcg by mouth daily. , Disp: , Rfl:  .  ferrous sulfate 325 (65 FE) MG tablet, TAKE 1 TABLET BY MOUTH EVERY DAY, Disp: 90 tablet, Rfl: 1 .  metFORMIN (GLUCOPHAGE) 1000 MG tablet, TAKE 1 TABLET TWICE DAILY WITH MEALS, Disp: 180 tablet, Rfl: 1 .  metoprolol succinate (TOPROL-XL) 50 MG 24 hr tablet, Take 1 tablet (50 mg total) by mouth daily. Take with or immediately following a meal., Disp: 90 tablet, Rfl: 3 .  omeprazole (PRILOSEC) 40 MG capsule, TAKE 1 CAPSULE EVERY DAY, Disp: 90 capsule, Rfl: 1 .  pravastatin (PRAVACHOL) 10 MG tablet, Take 1 tablet (10 mg total) by mouth at bedtime. (Patient taking differently: Take 10 mg by mouth every other day. ), Disp: 90 tablet, Rfl: 1 .  torsemide (DEMADEX) 10 MG tablet, Take 10 mg by mouth every morning., Disp: , Rfl:  .  Blood Glucose Monitoring Suppl (ONE TOUCH ULTRA SYSTEM KIT) w/Device KIT, [place] blood sugar once per day (Patient not taking: Reported on 03/10/2018), Disp: 1 each, Rfl: 0 .  glucose blood test strip, Check blood sugar once daily (Patient not taking: Reported on 03/10/2018), Disp: 100 each, Rfl: 4 .  HYDROcodone-acetaminophen (NORCO/VICODIN) 5-325 MG tablet, Take 1 tablet by mouth every 8 (eight) hours as needed for moderate pain. (Patient not taking: Reported on 03/10/2018), Disp: 30 tablet, Rfl: 0 .  Lancets (ONETOUCH ULTRASOFT) lancets, Check blood sugar once daily (Patient not taking: Reported on 03/10/2018), Disp: 100 each, Rfl: 3  Review of Systems  Constitutional: Negative.   Respiratory: Negative.   Cardiovascular: Negative.   Gastrointestinal: Negative.   Neurological: Negative.     Social History   Tobacco Use  . Smoking status: Never Smoker  . Smokeless tobacco: Never Used  Substance Use Topics  . Alcohol use: Yes    Comment: occasionally 1  glass of wine      Objective:   BP (!) 163/84 (BP Location: Left Arm, Patient Position: Sitting, Cuff Size: Large)   Pulse 64   Temp 97.9 F (36.6 C) (Oral)   Resp 16   Wt 188 lb (85.3 kg)   BMI 34.39 kg/m  Vitals:   05/04/18 0841  BP: (!) 163/84  Pulse: 64  Resp: 16  Temp: 97.9 F (36.6 C)  TempSrc: Oral  Weight: 188 lb (85.3 kg)     Physical Exam Vitals signs reviewed.  Constitutional:      General: She is not in acute distress.    Appearance: Normal appearance. She is well-developed. She is obese. She is not ill-appearing or diaphoretic.  Neck:     Musculoskeletal: Normal range of motion and neck supple.     Thyroid: No thyromegaly.     Vascular: No JVD.     Trachea: No tracheal deviation.  Cardiovascular:     Rate and Rhythm: Normal rate and regular rhythm.     Heart sounds: Normal heart sounds. No murmur. No friction rub. No gallop.   Pulmonary:     Effort: Pulmonary effort is normal. No respiratory distress.     Breath sounds: Normal breath sounds. No wheezing or rales.  Lymphadenopathy:     Cervical: No cervical adenopathy.  Neurological:     Mental Status: She is alert.    Diabetic Foot Exam - Simple   Simple Foot Form Diabetic Foot exam was performed with the following findings:  Yes 05/04/2018  9:54 AM  Visual Inspection No deformities, no ulcerations, no other skin breakdown bilaterally:  Yes Sensation Testing Intact to touch and monofilament testing bilaterally:  Yes Pulse Check Posterior Tibialis and Dorsalis pulse intact bilaterally:  Yes Comments        Assessment & Plan    1. Essential hypertension Will change benazepril to 22m tablet since she is taking the 251mtablets at the same time. Amlodipine pulled for refill (dose dependent on 73m13mue to lower extremity swelling). Continue Metoprolol XR 48m19mose dependent due to bradycardia) and Clonidine 0.1mg 67mtient taking only BID since she states TID caused indigestion). Will check labs  as below and f/u pending results. Will also forward labs to Dr. [facility]. She will be seeing him in May 2020.  - benazepril (LOTENSIN) 40 MG tablet; Take 1 tablet (40 mg total) by mouth daily.  Dispense: 90 tablet; Refill: 1 - amLODipine (NORVASC) 5 MG tablet; Take 1 tablet (5 mg total) by mouth daily.  Dispense: 90 tablet; Refill: 1 - CBC w/Diff/Platelet - Renal Function Panel - HgB A1c  2. Benign hypertension with CKD (chronic kidney disease) stage III (HCC) [place] above medical treatment plan. - cloNIDine (CATAPRES) 0.1 MG tablet; Take 1 tablet (0.1 mg total) by mouth 2 (two) times daily.  Dispense: 270 tablet; Refill: 1 - CBC w/Diff/Platelet - Renal Function Panel - HgB A1c  3. Hypercholesteremia Stable. Diagnosis pulled for medication refill. Continue current medical treatment plan. - pravastatin (PRAVACHOL)  10 MG tablet; Take 1 tablet (10 mg total) by mouth every other day.  Dispense: 90 tablet; Refill: 1 - CBC w/Diff/Platelet  4. Type 2 diabetes mellitus with stage 3 chronic kidney disease, without long-term current use of insulin (HCC) Continue metformin 1060m BID. Will check labs as below and f/u pending results. - CBC w/Diff/Platelet - Renal Function Panel - HgB A1c     JMar Daring PA-C  [employer]

## 2018-05-04 NOTE — Patient Instructions (Signed)

## 2018-05-05 ENCOUNTER — Other Ambulatory Visit: Payer: Self-pay | Admitting: Physician Assistant

## 2018-05-05 ENCOUNTER — Ambulatory Visit: Payer: Self-pay | Admitting: Physician Assistant

## 2018-05-05 DIAGNOSIS — K219 Gastro-esophageal reflux disease without esophagitis: Secondary | ICD-10-CM

## 2018-05-05 LAB — CBC WITH DIFFERENTIAL/PLATELET
Basophils Absolute: 0 10*3/uL (ref 0.0–0.2)
Basos: 1 %
EOS (ABSOLUTE): 0.1 10*3/uL (ref 0.0–0.4)
Eos: 3 %
Hematocrit: 28.4 % — ABNORMAL LOW (ref 34.0–46.6)
Hemoglobin: 9.2 g/dL — ABNORMAL LOW (ref 11.1–15.9)
Immature Grans (Abs): 0 10*3/uL (ref 0.0–0.1)
Immature Granulocytes: 0 %
Lymphocytes Absolute: 1.1 10*3/uL (ref 0.7–3.1)
Lymphs: 22 %
MCH: 29.1 pg (ref 26.6–33.0)
MCHC: 32.4 g/dL (ref 31.5–35.7)
MCV: 90 fL (ref 79–97)
Monocytes Absolute: 0.4 10*3/uL (ref 0.1–0.9)
Monocytes: 7 %
Neutrophils Absolute: 3.3 10*3/uL (ref 1.4–7.0)
Neutrophils: 67 %
Platelets: 300 10*3/uL (ref 150–450)
RBC: 3.16 x10E6/uL — ABNORMAL LOW (ref 3.77–5.28)
RDW: 12.9 % (ref 11.7–15.4)
WBC: 4.9 10*3/uL (ref 3.4–10.8)

## 2018-05-05 LAB — RENAL FUNCTION PANEL
Albumin: 4 g/dL (ref 3.7–4.7)
BUN/Creatinine Ratio: 20 (ref 12–28)
BUN: 30 mg/dL — ABNORMAL HIGH (ref 8–27)
CO2: 21 mmol/L (ref 20–29)
Calcium: 9.6 mg/dL (ref 8.7–10.3)
Chloride: 101 mmol/L (ref 96–106)
Creatinine, Ser: 1.49 mg/dL — ABNORMAL HIGH (ref 0.57–1.00)
GFR calc Af Amer: 39 mL/min/{1.73_m2} — ABNORMAL LOW (ref >59)
GFR calc non Af Amer: 34 mL/min/{1.73_m2} — ABNORMAL LOW (ref >59)
Glucose: 109 mg/dL — ABNORMAL HIGH (ref 65–99)
Phosphorus: 3.5 mg/dL (ref 3.0–4.3)
Potassium: 4.5 mmol/L (ref 3.5–5.2)
Sodium: 140 mmol/L (ref 134–144)

## 2018-05-05 LAB — HEMOGLOBIN A1C
Est. average glucose Bld gHb Est-mCnc: 128 mg/dL
Hgb A1c MFr Bld: 6.1 % — ABNORMAL HIGH (ref 4.8–5.6)

## 2018-05-10 ENCOUNTER — Telehealth: Payer: Self-pay

## 2018-05-10 DIAGNOSIS — R710 Precipitous drop in hematocrit: Secondary | ICD-10-CM

## 2018-05-10 NOTE — Telephone Encounter (Signed)
-----   Message from Mar Daring, [place] sent at 05/10/2018  3:27 PM EDT ----- Hemoglobin has dropped since the last check in the hospital from 10.5 to 9.2. I did have them add on an iron panel and iron is not terribly low. I am worried you may still be bleeding through the GI tract somewhere causing the hemoglobin to drop. We can go about working this up a couple ways, 1) is to just recheck your CBC next week and see if hemoglobin is continuing to drop or 2) call GI and get you in for consideration of a colonoscopy and EGD to see if there is a GI source for the drop in hemoglobin. You previously had a colonoscopy in 2017 with Dr. Rayann Heman at [place]. If you desire to go ahead and see GI I will place a referral to him for you. If you want to just recheck the lab next week I will place order. All other labs were stable.

## 2018-05-10 NOTE — Telephone Encounter (Signed)
No answer; unable to LM.

## 2018-05-11 LAB — IRON AND TIBC
Iron Saturation: 27 % (ref 15–55)
Iron: 62 ug/dL (ref 27–139)
Total Iron Binding Capacity: 232 ug/dL — ABNORMAL LOW (ref 250–450)
UIBC: 170 ug/dL (ref 118–369)

## 2018-05-11 LAB — SPECIMEN STATUS REPORT

## 2018-05-11 LAB — FERRITIN: Ferritin: 68 ng/mL (ref 15–150)

## 2018-05-12 NOTE — Telephone Encounter (Signed)
Patient advised as directed below. Per patient she prefers to recheck her CBC next week.Ordered printed and place up front. Thanks, -Arwilda [place]

## 2018-05-17 DIAGNOSIS — R710 Precipitous drop in hematocrit: Secondary | ICD-10-CM | POA: Diagnosis not present

## 2018-05-18 LAB — CBC WITH DIFFERENTIAL/PLATELET
Basophils Absolute: 0 10*3/uL (ref 0.0–0.2)
Basos: 1 %
EOS (ABSOLUTE): 0.1 10*3/uL (ref 0.0–0.4)
Eos: 2 %
Hematocrit: 29.1 % — ABNORMAL LOW (ref 34.0–46.6)
Hemoglobin: 9.9 g/dL — ABNORMAL LOW (ref 11.1–15.9)
Immature Grans (Abs): 0 10*3/uL (ref 0.0–0.1)
Immature Granulocytes: 0 %
Lymphocytes Absolute: 1.3 10*3/uL (ref 0.7–3.1)
Lymphs: 25 %
MCH: 29.2 pg (ref 26.6–33.0)
MCHC: 34 g/dL (ref 31.5–35.7)
MCV: 86 fL (ref 79–97)
Monocytes Absolute: 0.4 10*3/uL (ref 0.1–0.9)
Monocytes: 8 %
Neutrophils Absolute: 3.3 10*3/uL (ref 1.4–7.0)
Neutrophils: 64 %
Platelets: 272 10*3/uL (ref 150–450)
RBC: 3.39 x10E6/uL — ABNORMAL LOW (ref 3.77–5.28)
RDW: 12.8 % (ref 11.7–15.4)
WBC: 5.1 10*3/uL (ref 3.4–10.8)

## 2018-07-28 ENCOUNTER — Other Ambulatory Visit: Payer: Self-pay | Admitting: Physician Assistant

## 2018-07-28 DIAGNOSIS — E119 Type 2 diabetes mellitus without complications: Secondary | ICD-10-CM

## 2018-08-03 NOTE — Progress Notes (Signed)
Patient: Miranda Olsen Female    DOB: Apr 21, 1940   78 y.o.   MRN: [id number] Visit Date: 08/04/2018  Today's Provider: Mar Daring, PA-C   Chief Complaint  Patient presents with  . Follow-up  . Hypertension  . Diarrhea  . Hyperlipidemia   Subjective:     HPI   Essential hypertension From 05/04/2018-changed benazepril to 567m tablet since she is taking the 251mtablets at the same time. Amlodipine pulled for refill (dose dependent on 67m6mue to lower extremity swelling). Continue Metoprolol XR 22m47mose dependent due to bradycardia) and Clonidine 0.1mg 28mtient taking only BID since she states TID caused indigestion). Will check labs and forward labs to Dr. SinghCandiss [facility]. She will be seeing him in May 2020.   Benign hypertension with CKD (chronic kidney disease) stage III (HCC) [place] 05/04/2018-labs checked, no changes.   Home readings 140-150s/70s  Hypercholesteremia From 05/04/2018-labs checked, no changes.   Type 2 diabetes mellitus with stage 3 chronic kidney disease, without long-term current use of insulin (HCC) [place] 05/04/2018-labs checked, no changes.  Not checking blood sugars at home.   No Known Allergies   Current Outpatient Medications:  .  amLODipine (NORVASC) 5 MG tablet, Take 1 tablet (5 mg total) by mouth daily., Disp: 90 tablet, Rfl: 1 .  benazepril (LOTENSIN) 40 MG tablet, Take 1 tablet (40 mg total) by mouth daily., Disp: 90 tablet, Rfl: 1 .  cholecalciferol (VITAMIN D) 1000 UNITS tablet, Take 1,000 Units by mouth daily. , Disp: , Rfl:  .  cloNIDine (CATAPRES) 0.1 MG tablet, Take 1 tablet (0.1 mg total) by mouth 2 (two) times daily., Disp: 270 tablet, Rfl: 1 .  ferrous sulfate 325 (65 FE) MG tablet, TAKE 1 TABLET BY MOUTH EVERY DAY, Disp: 90 tablet, Rfl: 1 .  metFORMIN (GLUCOPHAGE) 1000 MG tablet, TAKE 1 TABLET TWICE DAILY WITH MEALS, Disp: 180 tablet, Rfl: 1 .  metoprolol succinate (TOPROL-XL) 50 MG 24 hr tablet, Take 1 tablet (50 mg  total) by mouth daily. Take with or immediately following a meal., Disp: 90 tablet, Rfl: 3 .  omeprazole (PRILOSEC) 40 MG capsule, TAKE 1 CAPSULE EVERY DAY, Disp: 90 capsule, Rfl: 1 .  pravastatin (PRAVACHOL) 10 MG tablet, Take 1 tablet (10 mg total) by mouth every other day., Disp: 90 tablet, Rfl: 1 .  torsemide (DEMADEX) 10 MG tablet, Take 10 mg by mouth every morning., Disp: , Rfl:  .  Blood Glucose Monitoring Suppl (ONE TOUCH ULTRA SYSTEM KIT) w/Device KIT, [place] blood sugar once per day (Patient not taking: Reported on 03/10/2018), Disp: 1 each, Rfl: 0 .  cyanocobalamin 1000 MCG tablet, Take 1,000 mcg by mouth daily. , Disp: , Rfl:  .  glucose blood test strip, Check blood sugar once daily (Patient not taking: Reported on 03/10/2018), Disp: 100 each, Rfl: 4 .  Lancets (ONETOUCH ULTRASOFT) lancets, Check blood sugar once daily (Patient not taking: Reported on 03/10/2018), Disp: 100 each, Rfl: 3  Review of Systems  Constitutional: Negative for appetite change, chills, fatigue and fever.  Respiratory: Negative for chest tightness and shortness of breath.   Cardiovascular: Negative for chest pain and palpitations.  Gastrointestinal: Negative for abdominal pain, nausea and vomiting.  Neurological: Negative for dizziness and weakness.    Social History   Tobacco Use  . Smoking status: Never Smoker  . Smokeless tobacco: Never Used  Substance Use Topics  . Alcohol use: Yes    Comment: occasionally 1 glass of wine  Objective:   BP (!) 161/89 (BP Location: Left Arm, Patient Position: Sitting, Cuff Size: Large)   Pulse 76   Temp 98.7 F (37.1 C) (Oral)   Resp 16   Ht _0  (1.575 m)   Wt 195 lb (88.5 kg)   SpO2 98%   BMI 35.67 kg/m  Vitals:   08/04/18 0839  BP: (!) 161/89  Pulse: 76  Resp: 16  Temp: 98.7 F (37.1 C)  TempSrc: Oral  SpO2: 98%  Weight: 195 lb (88.5 kg)  Height: _1  (1.575 m)     Physical Exam Vitals signs reviewed.  Constitutional:      General:  She is not in acute distress.    Appearance: Normal appearance. She is well-developed. She is obese. She is not ill-appearing or diaphoretic.  Neck:     Musculoskeletal: Normal range of motion and neck supple.     Thyroid: No thyromegaly.     Vascular: No JVD.     Trachea: No tracheal deviation.  Cardiovascular:     Rate and Rhythm: Normal rate and regular rhythm.     Pulses: Normal pulses.     Heart sounds: Normal heart sounds. No murmur. No friction rub. No gallop.   Pulmonary:     Effort: Pulmonary effort is normal. No respiratory distress.     Breath sounds: Normal breath sounds. No wheezing or rales.  Musculoskeletal:     Right lower leg: Edema present.     Left lower leg: Edema present.  Lymphadenopathy:     Cervical: No cervical adenopathy.  Skin:    Capillary Refill: Capillary refill takes less than 2 seconds.  Neurological:     General: No focal deficit present.     Mental Status: She is alert and oriented to person, place, and time. Mental status is at baseline.     Cranial Nerves: No cranial nerve deficit.     Motor: No weakness.     Coordination: Coordination normal.     Gait: Gait normal.  Psychiatric:        Mood and Affect: Mood normal.        Behavior: Behavior normal.        Thought Content: Thought content normal.        Judgment: Judgment normal.      No results found for any visits on 08/04/18.     Assessment & Plan    1. Type 2 diabetes mellitus with stage 3 chronic kidney disease, without long-term current use of insulin (HCC) A1c increased to 6.8 from 6.2. Patient does admit to eating a lot of fruit and country ham. Discussed dietary habits for a diabetic and to really limit those foods. Will check labs as below and f/u pending results. - POCT glycosylated hemoglobin (Hb A1C) - CBC w/Diff/Platelet - Renal Function Panel  2. CKD stage 3 due to type 2 diabetes mellitus ([place]) Has been stable. Due to see Dr. Candiss Norse next month. Will check labs as  below and f/u pending results. Will forward to Dr. Candiss Norse. - CBC w/Diff/Platelet - Renal Function Panel  3. Essential hypertension Still elevated but patient is dose dependent on all medications due to adverse effects. Home readings are better than in office readings indicating some white coat component. Will continue to monitor closely.  - CBC w/Diff/Platelet - Renal Function Panel  4. Iron deficiency anemia secondary to inadequate dietary iron intake H/O this. Had GI bleed the resolved and took oral iron. Will check labs as below and  f/u pending results. - CBC w/Diff/Platelet - Fe+TIBC+Fer - Renal Function Panel     Mar Daring, PA-C  [facility]

## 2018-08-04 ENCOUNTER — Ambulatory Visit (INDEPENDENT_AMBULATORY_CARE_PROVIDER_SITE_OTHER): Payer: Medicare HMO | Admitting: Physician Assistant

## 2018-08-04 ENCOUNTER — Encounter: Payer: Self-pay | Admitting: Physician Assistant

## 2018-08-04 ENCOUNTER — Other Ambulatory Visit: Payer: Self-pay

## 2018-08-04 VITALS — BP 161/89 | HR 76 | Temp 98.7°F | Resp 16 | Ht 62.0 in | Wt 195.0 lb

## 2018-08-04 DIAGNOSIS — I10 Essential (primary) hypertension: Secondary | ICD-10-CM

## 2018-08-04 DIAGNOSIS — D508 Other iron deficiency anemias: Secondary | ICD-10-CM | POA: Diagnosis not present

## 2018-08-04 DIAGNOSIS — N183 Chronic kidney disease, stage 3 unspecified: Secondary | ICD-10-CM

## 2018-08-04 DIAGNOSIS — E1122 Type 2 diabetes mellitus with diabetic chronic kidney disease: Secondary | ICD-10-CM

## 2018-08-04 LAB — POCT GLYCOSYLATED HEMOGLOBIN (HGB A1C)
Est. average glucose Bld gHb Est-mCnc: 148
Hemoglobin A1C: 6.8 % — AB (ref 4.0–5.6)

## 2018-08-04 NOTE — Patient Instructions (Signed)

## 2018-08-05 LAB — CBC WITH DIFFERENTIAL/PLATELET
Basophils Absolute: 0 10*3/uL (ref 0.0–0.2)
Basos: 1 %
EOS (ABSOLUTE): 0.1 10*3/uL (ref 0.0–0.4)
Eos: 3 %
Hematocrit: 30.6 % — ABNORMAL LOW (ref 34.0–46.6)
Hemoglobin: 10.1 g/dL — ABNORMAL LOW (ref 11.1–15.9)
Immature Grans (Abs): 0 10*3/uL (ref 0.0–0.1)
Immature Granulocytes: 0 %
Lymphocytes Absolute: 1.1 10*3/uL (ref 0.7–3.1)
Lymphs: 25 %
MCH: 28.9 pg (ref 26.6–33.0)
MCHC: 33 g/dL (ref 31.5–35.7)
MCV: 88 fL (ref 79–97)
Monocytes Absolute: 0.3 10*3/uL (ref 0.1–0.9)
Monocytes: 7 %
Neutrophils Absolute: 2.8 10*3/uL (ref 1.4–7.0)
Neutrophils: 64 %
Platelets: 249 10*3/uL (ref 150–450)
RBC: 3.49 x10E6/uL — ABNORMAL LOW (ref 3.77–5.28)
RDW: 13.2 % (ref 11.7–15.4)
WBC: 4.4 10*3/uL (ref 3.4–10.8)

## 2018-08-05 LAB — RENAL FUNCTION PANEL
Albumin: 4 g/dL (ref 3.7–4.7)
BUN/Creatinine Ratio: 17 (ref 12–28)
BUN: 25 mg/dL (ref 8–27)
CO2: 24 mmol/L (ref 20–29)
Calcium: 9.3 mg/dL (ref 8.7–10.3)
Chloride: 97 mmol/L (ref 96–106)
Creatinine, Ser: 1.51 mg/dL — ABNORMAL HIGH (ref 0.57–1.00)
GFR calc Af Amer: 38 mL/min/{1.73_m2} — ABNORMAL LOW (ref >59)
GFR calc non Af Amer: 33 mL/min/{1.73_m2} — ABNORMAL LOW (ref >59)
Glucose: 106 mg/dL — ABNORMAL HIGH (ref 65–99)
Phosphorus: 3.7 mg/dL (ref 3.0–4.3)
Potassium: 4.8 mmol/L (ref 3.5–5.2)
Sodium: 137 mmol/L (ref 134–144)

## 2018-08-05 LAB — IRON,TIBC AND FERRITIN PANEL
Ferritin: 68 ng/mL (ref 15–150)
Iron Saturation: 31 % (ref 15–55)
Iron: 74 ug/dL (ref 27–139)
Total Iron Binding Capacity: 235 ug/dL — ABNORMAL LOW (ref 250–450)
UIBC: 161 ug/dL (ref 118–369)

## 2018-08-07 ENCOUNTER — Telehealth: Payer: Self-pay

## 2018-08-07 NOTE — Telephone Encounter (Signed)
Patient advised as directed below. 

## 2018-08-07 NOTE — Telephone Encounter (Signed)
-----   Message from Mar Daring, PA-C sent at 08/07/2018  9:33 AM EDT ----- All labs are stable. Iron stores holding steady and kidney function stable.

## 2018-08-07 NOTE — Telephone Encounter (Signed)
No answer, call cannot be completed at this time.

## 2018-09-08 ENCOUNTER — Other Ambulatory Visit: Payer: Self-pay | Admitting: Physician Assistant

## 2018-09-08 DIAGNOSIS — K219 Gastro-esophageal reflux disease without esophagitis: Secondary | ICD-10-CM

## 2018-10-23 ENCOUNTER — Other Ambulatory Visit: Payer: Self-pay | Admitting: Physician Assistant

## 2018-10-23 DIAGNOSIS — D539 Nutritional anemia, unspecified: Secondary | ICD-10-CM

## 2018-10-30 ENCOUNTER — Other Ambulatory Visit: Payer: Self-pay | Admitting: *Deleted

## 2018-10-30 DIAGNOSIS — I129 Hypertensive chronic kidney disease with stage 1 through stage 4 chronic kidney disease, or unspecified chronic kidney disease: Secondary | ICD-10-CM

## 2018-10-30 MED ORDER — CLONIDINE HCL 0.1 MG PO TABS: 0.1000 mg | ORAL_TABLET | Freq: Two times a day (BID) | ORAL | 1 refills | Status: DC

## 2018-11-01 ENCOUNTER — Ambulatory Visit: Payer: Self-pay | Admitting: Physician Assistant

## 2018-11-08 ENCOUNTER — Ambulatory Visit: Payer: Medicare HMO | Admitting: Physician Assistant

## 2018-11-08 ENCOUNTER — Telehealth: Payer: Self-pay | Admitting: Physician Assistant

## 2018-11-08 DIAGNOSIS — I129 Hypertensive chronic kidney disease with stage 1 through stage 4 chronic kidney disease, or unspecified chronic kidney disease: Secondary | ICD-10-CM

## 2018-11-08 NOTE — Telephone Encounter (Signed)
Pt is out of her cloNIDine (CATAPRES) 0.1 MG tablet.  She says she told the CMA she wanted her Rx called into [facility]. [facility] was [facility] into [facility].  It doesn't cost her out of pocket if [employer] fills it.    Please advise asap.  Thanks, [employer]

## 2018-11-09 MED ORDER — CLONIDINE HCL 0.1 MG PO TABS: 0.1000 mg | ORAL_TABLET | Freq: Two times a day (BID) | ORAL | 1 refills | Status: DC

## 2018-11-09 NOTE — Telephone Encounter (Signed)
refilled 

## 2018-11-13 ENCOUNTER — Other Ambulatory Visit: Payer: Self-pay

## 2018-11-13 ENCOUNTER — Ambulatory Visit: Payer: Self-pay | Admitting: Physician Assistant

## 2018-11-13 ENCOUNTER — Ambulatory Visit (INDEPENDENT_AMBULATORY_CARE_PROVIDER_SITE_OTHER): Payer: Medicare HMO | Admitting: Physician Assistant

## 2018-11-13 ENCOUNTER — Encounter: Payer: Self-pay | Admitting: Physician Assistant

## 2018-11-13 VITALS — BP 132/72 | HR 82 | Temp 96.9°F | Resp 16 | Wt 196.2 lb

## 2018-11-13 DIAGNOSIS — E7800 Pure hypercholesterolemia, unspecified: Secondary | ICD-10-CM

## 2018-11-13 DIAGNOSIS — E1122 Type 2 diabetes mellitus with diabetic chronic kidney disease: Secondary | ICD-10-CM

## 2018-11-13 DIAGNOSIS — I10 Essential (primary) hypertension: Secondary | ICD-10-CM

## 2018-11-13 DIAGNOSIS — N183 Chronic kidney disease, stage 3 unspecified: Secondary | ICD-10-CM | POA: Diagnosis not present

## 2018-11-13 LAB — POCT GLYCOSYLATED HEMOGLOBIN (HGB A1C)
Est. average glucose Bld gHb Est-mCnc: 140
Hemoglobin A1C: 6.5 % — AB (ref 4.0–5.6)

## 2018-11-13 MED ORDER — BENAZEPRIL HCL 40 MG PO TABS: 40.0000 mg | ORAL_TABLET | Freq: Every day | ORAL | 1 refills | Status: DC

## 2018-11-13 MED ORDER — AMLODIPINE BESYLATE 5 MG PO TABS: 5.0000 mg | ORAL_TABLET | Freq: Every day | ORAL | 1 refills | Status: DC

## 2018-11-13 MED ORDER — PRAVASTATIN SODIUM 10 MG PO TABS: 10.0000 mg | ORAL_TABLET | ORAL | 1 refills | Status: DC

## 2018-11-13 NOTE — Progress Notes (Signed)
Patient: Miranda Olsen Female    DOB: Sep 12, 1940   78 y.o.   MRN: [id number] Visit Date: 11/18/2018  Today's Provider: Mar Daring, PA-C   Chief Complaint  Patient presents with  . Follow-up    HTN and T2DM   Subjective:    I,Joseline E. Rosas,RMA am acting as a Education administrator for [employer], PA-C.  HPI   Hypertension, follow-up:  BP Readings from Last 3 Encounters:  11/13/18 132/72  08/04/18 (!) 161/89  05/04/18 (!) 163/84    She was last seen for hypertension 3 months ago.  BP at that visit was 161/89. Management since that visit includes none. She reports good compliance with treatment. She is not having side effects.  She is not exercising. She is adherent to low salt diet.   Outside blood pressures are stable. She is experiencing none.  Patient denies chest pain.   Cardiovascular risk factors include advanced age (older than 80 for men, 82 for women), diabetes mellitus, dyslipidemia and hypertension.    Diabetes Mellitus Type II, Follow-up:   Lab Results  Component Value Date   HGBA1C 6.5 (A) 11/13/2018   HGBA1C 6.8 (A) 08/04/2018   HGBA1C 6.1 (H) 05/04/2018    Last seen for diabetes 3 months ago.  Management since then includes Discussed dietary habits for a diabetic and to really limit those foods. She reports good compliance with treatment. She is not having side effects. Current symptoms include none and have been stable. Home blood sugar records: fasting range: not being checked  Episodes of hypoglycemia? no   Current insulin regiment: Is not on insulin Most Recent Eye Exam:  Weight trend: stable Prior visit with dietician: No Current exercise: none Current diet habits: in general, a "healthy" diet    Pertinent Labs:    Component Value Date/Time   CHOL 283 (H) 11/02/2017 1044   TRIG 76 11/02/2017 1044   HDL 77 11/02/2017 1044   LDLCALC 191 (H) 11/02/2017 1044   CREATININE 1.51 (H) 08/04/2018 0927    Wt Readings  from Last 3 Encounters:  11/13/18 196 lb 3.2 oz (89 kg)  08/04/18 195 lb (88.5 kg)  05/04/18 188 lb (85.3 kg)   ------------------------------------------------------------------------    No Known Allergies   Current Outpatient Medications:  .  amLODipine (NORVASC) 5 MG tablet, Take 1 tablet (5 mg total) by mouth daily., Disp: 90 tablet, Rfl: 1 .  benazepril (LOTENSIN) 40 MG tablet, Take 1 tablet (40 mg total) by mouth daily., Disp: 90 tablet, Rfl: 1 .  cholecalciferol (VITAMIN D) 1000 UNITS tablet, Take 1,000 Units by mouth daily. , Disp: , Rfl:  .  ferrous sulfate 325 (65 FE) MG tablet, TAKE 1 TABLET BY MOUTH EVERY DAY, Disp: 90 tablet, Rfl: 1 .  metFORMIN (GLUCOPHAGE) 1000 MG tablet, TAKE 1 TABLET TWICE DAILY WITH MEALS, Disp: 180 tablet, Rfl: 1 .  metoprolol succinate (TOPROL-XL) 50 MG 24 hr tablet, Take 1 tablet (50 mg total) by mouth daily. Take with or immediately following a meal., Disp: 90 tablet, Rfl: 3 .  omeprazole (PRILOSEC) 40 MG capsule, TAKE 1 CAPSULE EVERY DAY, Disp: 90 capsule, Rfl: 1 .  pravastatin (PRAVACHOL) 10 MG tablet, Take 1 tablet (10 mg total) by mouth every other day., Disp: 90 tablet, Rfl: 1 .  torsemide (DEMADEX) 10 MG tablet, Take 10 mg by mouth every morning., Disp: , Rfl:  .  Blood Glucose Monitoring Suppl (ONE TOUCH ULTRA SYSTEM KIT) w/Device KIT, [place]  blood sugar once per day (Patient not taking: Reported on 03/10/2018), Disp: 1 each, Rfl: 0 .  cloNIDine (CATAPRES) 0.1 MG tablet, Take 1 tablet (0.1 mg total) by mouth 2 (two) times daily. (Patient not taking: Reported on 11/13/2018), Disp: 270 tablet, Rfl: 1 .  glucose blood test strip, Check blood sugar once daily (Patient not taking: Reported on 03/10/2018), Disp: 100 each, Rfl: 4 .  Lancets (ONETOUCH ULTRASOFT) lancets, Check blood sugar once daily (Patient not taking: Reported on 03/10/2018), Disp: 100 each, Rfl: 3  Review of Systems  Constitutional: Negative.   Eyes: Negative.   Respiratory:  Negative.   Cardiovascular: Negative.   Endocrine: Negative.   Neurological: Negative.     Social History   Tobacco Use  . Smoking status: Never Smoker  . Smokeless tobacco: Never Used  Substance Use Topics  . Alcohol use: Yes    Comment: occasionally 1 glass of wine      Objective:   BP 132/72 (BP Location: Left Arm, Patient Position: Sitting, Cuff Size: Normal)   Pulse 82   Temp (!) 96.9 F (36.1 C) (Temporal)   Resp 16   Wt 196 lb 3.2 oz (89 kg)   SpO2 99%   BMI 35.89 kg/m  Vitals:   11/13/18 1344 11/13/18 1425  BP: 116/74 132/72  Pulse: 82   Resp: 16   Temp: (!) 96.9 F (36.1 C)   TempSrc: Temporal   SpO2: 99%   Weight: 196 lb 3.2 oz (89 kg)   Body mass index is 35.89 kg/m.   Physical Exam Vitals signs reviewed.  Constitutional:      General: She is not in acute distress.    Appearance: Normal appearance. She is well-developed. She is obese. She is not ill-appearing or diaphoretic.  Neck:     Musculoskeletal: Normal range of motion and neck supple.     Thyroid: No thyromegaly.     Vascular: No JVD.     Trachea: No tracheal deviation.  Cardiovascular:     Rate and Rhythm: Normal rate and regular rhythm.     Heart sounds: Normal heart sounds. No murmur. No friction rub. No gallop.   Pulmonary:     Effort: Pulmonary effort is normal. No respiratory distress.     Breath sounds: Normal breath sounds. No wheezing or rales.  Lymphadenopathy:     Cervical: No cervical adenopathy.  Neurological:     Mental Status: She is alert.      Results for orders placed or performed in visit on 11/13/18  POCT glycosylated hemoglobin (Hb A1C)  Result Value Ref Range   Hemoglobin A1C 6.5 (A) 4.0 - 5.6 %   Est. average glucose Bld gHb Est-mCnc 140        Assessment & Plan    1. CKD stage 3 due to type 2 diabetes mellitus (HCC) Stable. Followed by Nephrology.   2. Essential hypertension Improved. Continue current regimen of clonidine 0.'1mg'$  BID, metoprolol XR  '50mg'$  daily, beanzepril '40mg'$  and amlodipine '5mg'$ . . I will see her back in 4 months for her AWV.  - benazepril (LOTENSIN) 40 MG tablet; Take 1 tablet (40 mg total) by mouth daily.  Dispense: 90 tablet; Refill: 1 - amLODipine (NORVASC) 5 MG tablet; Take 1 tablet (5 mg total) by mouth daily.  Dispense: 90 tablet; Refill: 1  3. Hypercholesteremia Stable. Diagnosis pulled for medication refill. Continue current medical treatment plan. - pravastatin (PRAVACHOL) 10 MG tablet; Take 1 tablet (10 mg total) by mouth every other  day.  Dispense: 90 tablet; Refill: [place], PA-C  [facility]

## 2018-11-30 IMAGING — US US ABDOMEN LIMITED
1 series · 14 of 25 positions shown · non-contrast
Comparison: None.

CLINICAL DATA: 76-year-old female upper abdominal mass which is
enlarging. Initial encounter.

EXAM:
ULTRASOUND ABDOMEN LIMITED

[Series 1: us abdomen limited · 0.15mm/px · 33 acquisitions, 14 frames shown]
[im 1/33]
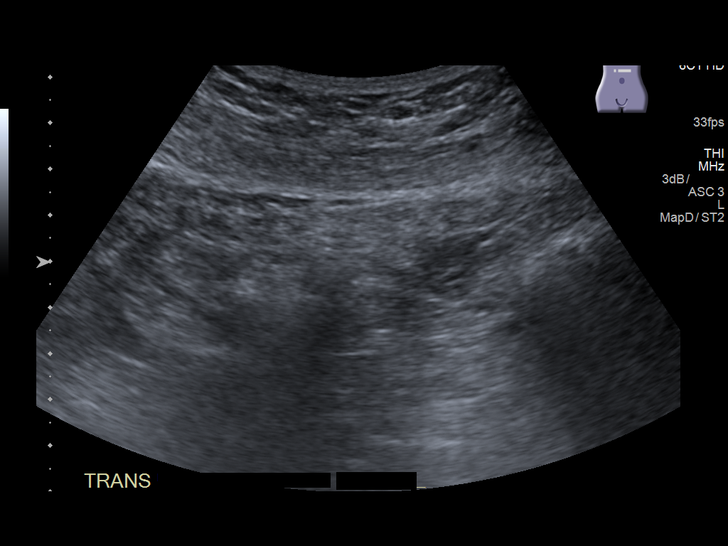
[im 3/33]
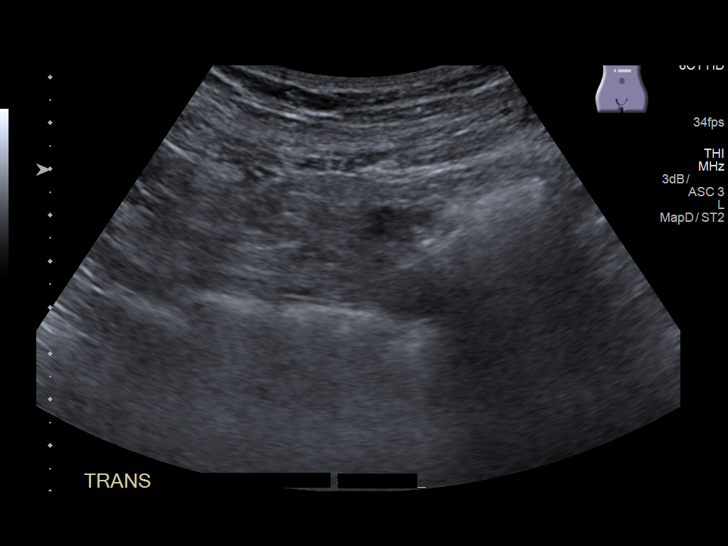
[im 6/33]
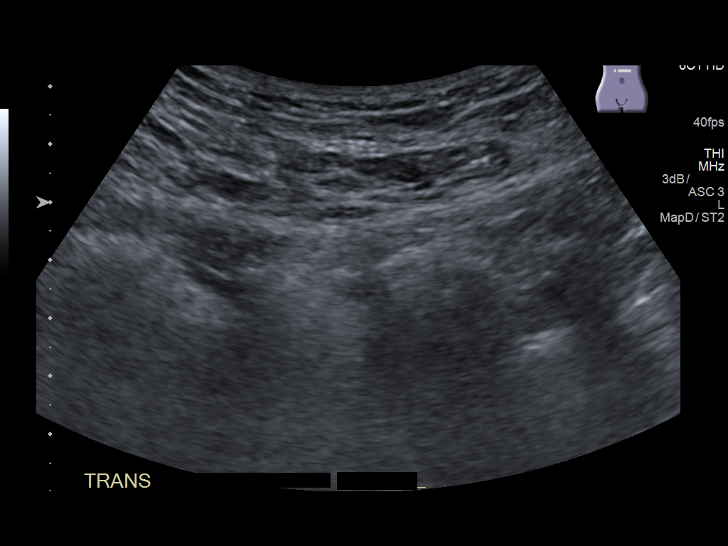
[im 9/33]
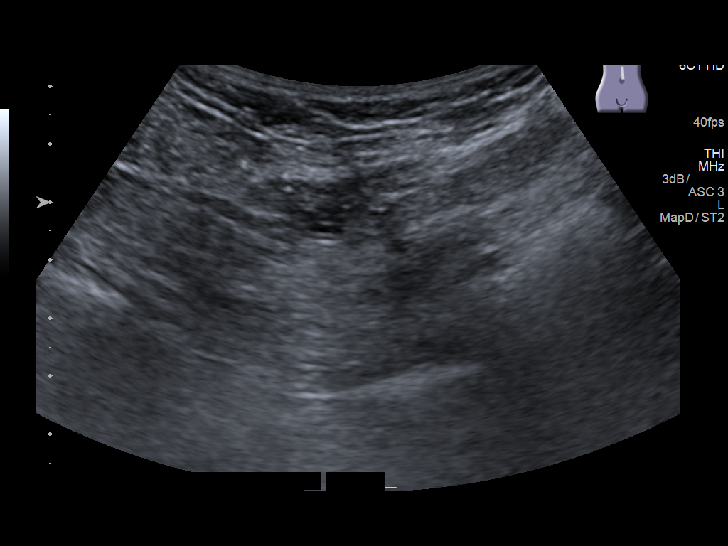
[im 11/33]
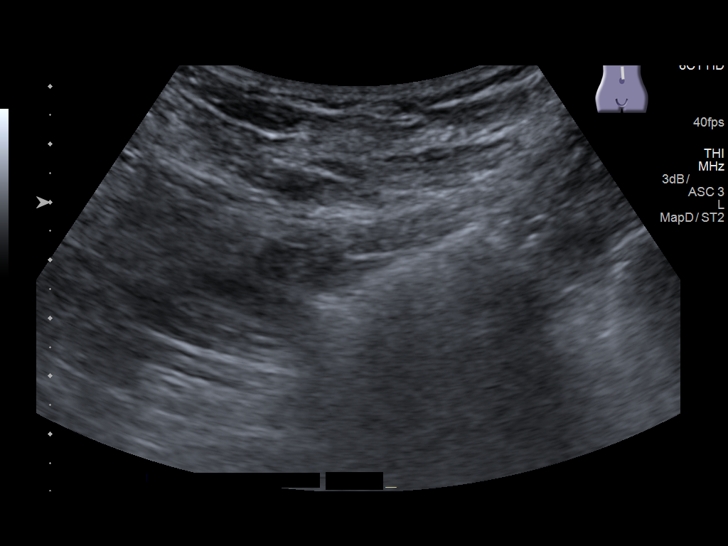
[im 13/33]
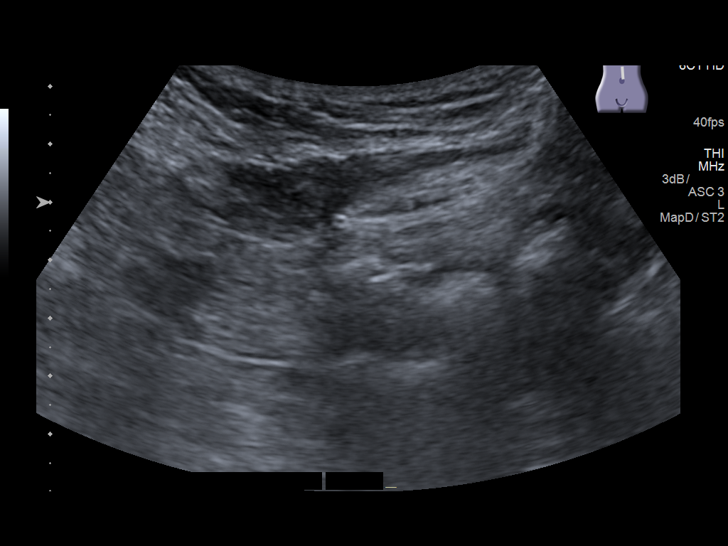
[im 15/33]
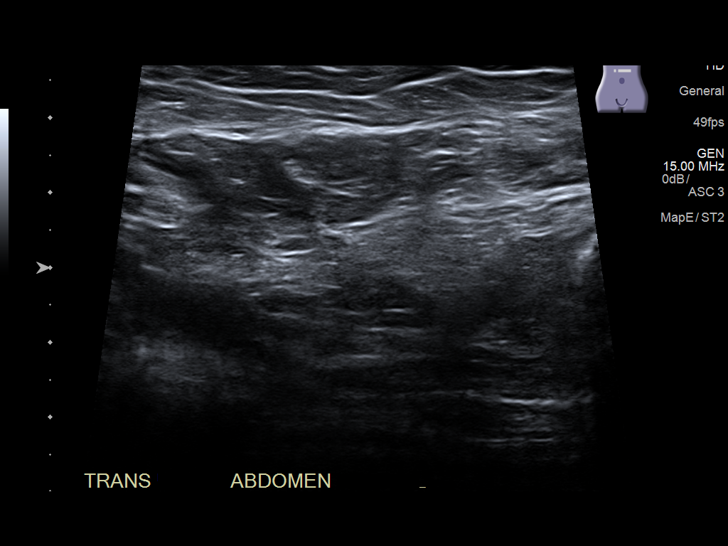
[im 18/33]
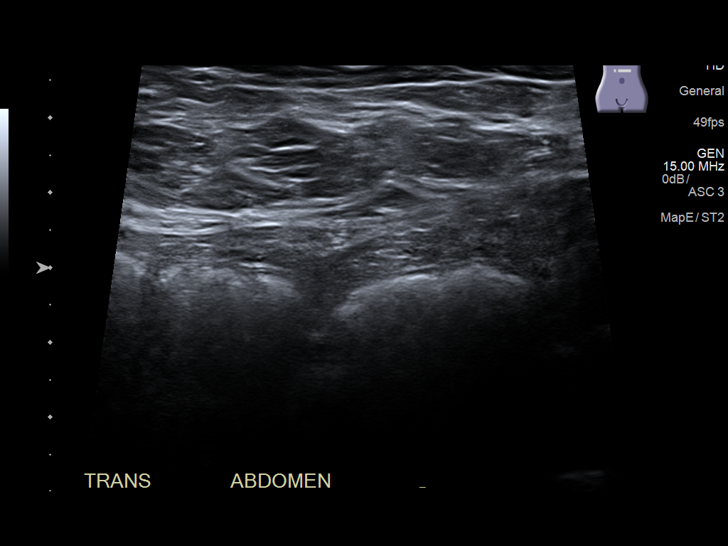
[im 21/33]
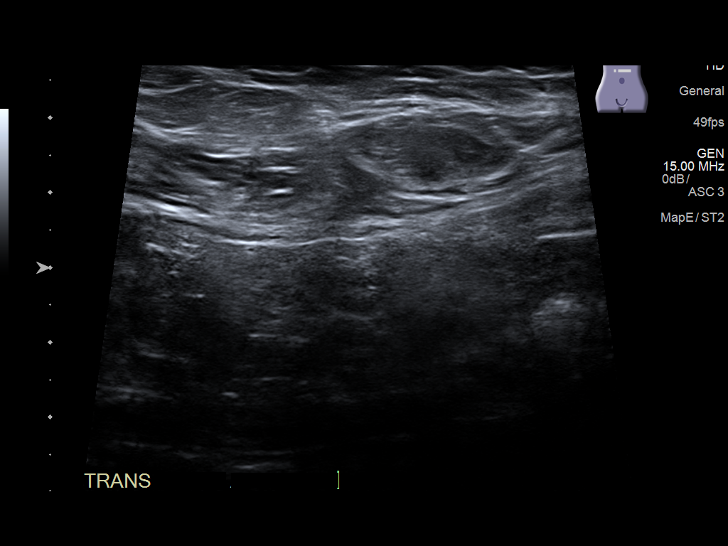
[im 22/33]
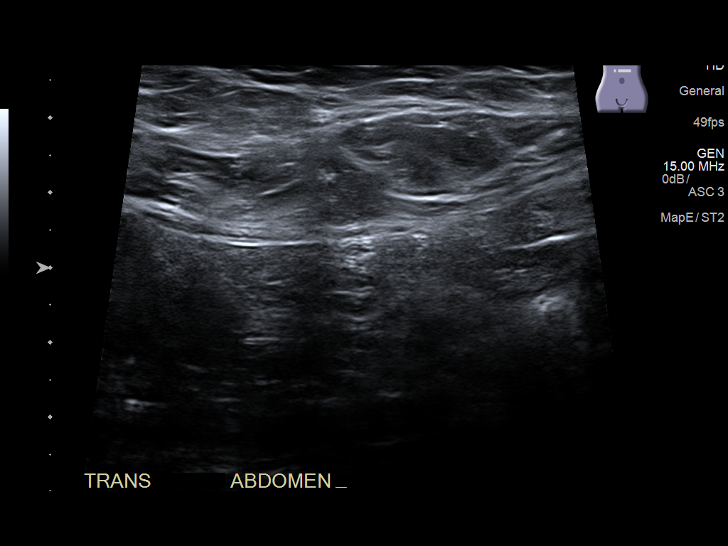
[im 25/33]
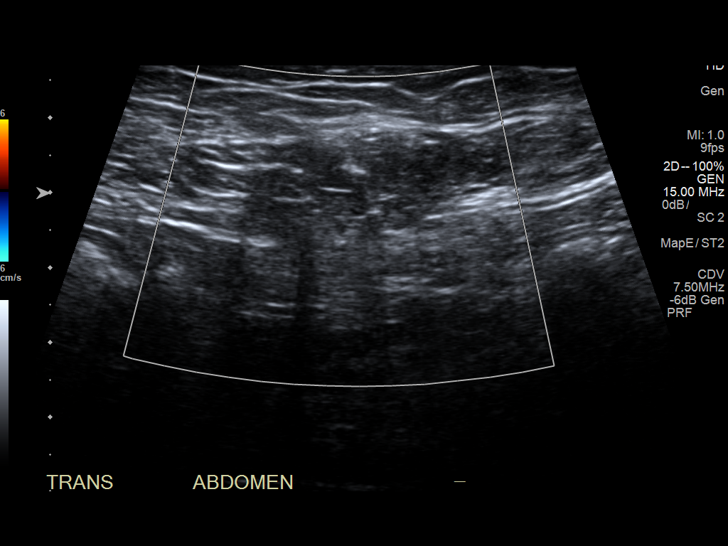
[im 27/33]
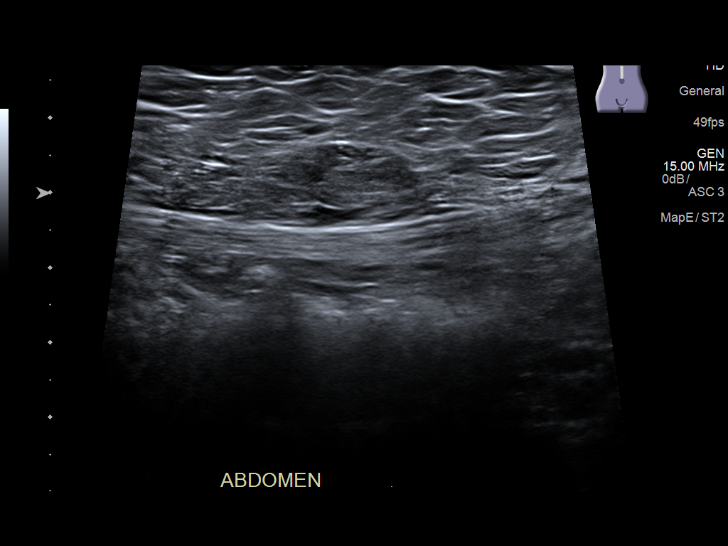
[im 30/33]
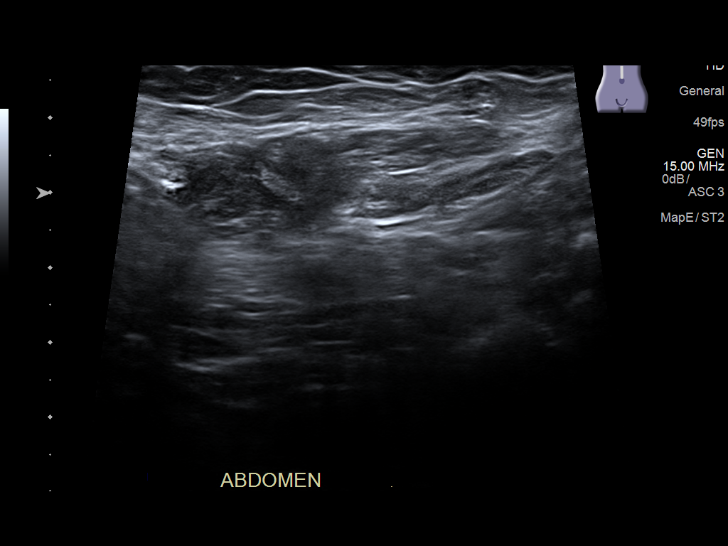
[im 33/33]
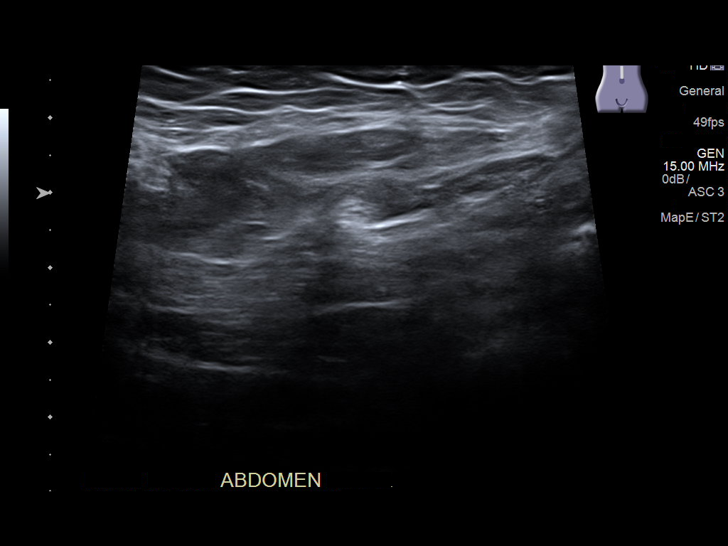

[14 of 25 positions shown; findings below may reference images not displayed]

FINDINGS: Where patient has a palpable abnormality within the upper abdomen,
there is a 5 x 1.3 x 5.6 cm structure which may represent fat
containing hernia as peristalsis was not identified per ultrasound
technologist.
IMPRESSION: Anterior abdominal wall fat containing hernia suspected. No
peristalsis detected as may be expected if this represented a bowel
containing hernia.

If further delineation is clinically desired, CT may then be
considered.

## 2018-12-28 ENCOUNTER — Other Ambulatory Visit: Payer: Self-pay | Admitting: Physician Assistant

## 2018-12-28 DIAGNOSIS — N183 Chronic kidney disease, stage 3 unspecified: Secondary | ICD-10-CM

## 2018-12-28 DIAGNOSIS — I129 Hypertensive chronic kidney disease with stage 1 through stage 4 chronic kidney disease, or unspecified chronic kidney disease: Secondary | ICD-10-CM

## 2018-12-30 DIAGNOSIS — Z20828 Contact with and (suspected) exposure to other viral communicable diseases: Secondary | ICD-10-CM | POA: Diagnosis not present

## 2019-01-09 DIAGNOSIS — Z03818 Encounter for observation for suspected exposure to other biological agents ruled out: Secondary | ICD-10-CM | POA: Diagnosis not present

## 2019-02-28 ENCOUNTER — Other Ambulatory Visit: Payer: Self-pay | Admitting: Physician Assistant

## 2019-02-28 DIAGNOSIS — Z1231 Encounter for screening mammogram for malignant neoplasm of breast: Secondary | ICD-10-CM

## 2019-03-13 NOTE — Progress Notes (Signed)
Subjective:   Miranda Olsen is a 79 y.o. female who presents for Medicare Annual (Subsequent) preventive examination.    This visit is being conducted through telemedicine due to the COVID-19 pandemic. This patient has given me verbal consent via doximity to conduct this visit, patient states they are participating from their home address. Some vital signs may be absent or patient reported.    Patient identification: identified by name, DOB, and current address  Review of Systems:  N/A  Cardiac Risk Factors include: advanced age (>55mn, >>58women);diabetes mellitus;dyslipidemia;hypertension     Objective:     Vitals: There were no vitals taken for this visit.  There is no height or weight on file to calculate BMI. Unable to obtain vitals due to visit being conducted via telephonically.   Advanced Directives 03/14/2019 04/01/2018 03/10/2018 02/24/2017 01/26/2015 12/19/2014  Does Patient Have a Medical Advance Directive? No No No No No No  Would patient like information on creating a medical advance directive? No - Patient declined - Yes (MAU/Ambulatory/Procedural Areas - Information given) No - Patient declined - -    Tobacco Social History   Tobacco Use  Smoking Status Never Smoker  Smokeless Tobacco Never Used     Counseling given: Not Answered   Clinical Intake:  Pre-visit preparation completed: Yes  Pain : No/denies pain Pain Score: 0-No pain     Nutritional Risks: None Diabetes: Yes  How often do you need to have someone help you when you read instructions, pamphlets, or other written materials from your doctor or pharmacy?: 1 - Never   Diabetes:  Is the patient diabetic?  Yes  If diabetic, was a CBG obtained today?  No  Did the patient bring in their glucometer from home?  No  How often do you monitor your CBG's? Not checking at all. Pt has not been able to get her meter to work. Advised her to bring devices into next apt on 03/19/19 to be shown how to  work it.  Financial Strains and Diabetes Management:  Are you having any financial strains with the device, your supplies or your medication? No .  Does the patient want to be seen by Chronic Care Management for management of their diabetes?  No  Would the patient like to be referred to a Nutritionist or for Diabetic Management?  No   Diabetic Exams:  Diabetic Eye Exam: Completed 02/21/17. Overdue for diabetic eye exam. Pt has been advised about the importance in completing this exam.   Diabetic Foot Exam: Completed 05/04/18. Repeat yearly.   Interpreter Needed?: No  Information [facility] by :: [facility]  Past Medical History:  Diagnosis Date  . Diabetes mellitus without complication ([facility]   . Hypercholesterolemia   . Hypertension    Past Surgical History:  Procedure Laterality Date  . COLONOSCOPY WITH PROPOFOL N/A 01/27/2015   Procedure: COLONOSCOPY WITH PROPOFOL;  Surgeon: MJosefine Class MD;  [facility]  Service: Endoscopy;  Laterality: N/A;  . EYE SURGERY Left 01/21/2009    cataract surgery   . EYE SURGERY Right 04/08/2009   cataract surgery  . REPLACEMENT TOTAL KNEE Left 10/08/2009   Dr. HMarry Guan  Family History  Problem Relation Age of Onset  . Parkinsonism Mother   . Leukemia Father   . Cancer Sister        breast  . Breast cancer Sister 521 . Congestive Heart Failure Sister   . Congestive Heart Failure Sister   . Lung cancer  Brother   . Breast cancer Maternal Aunt 60  . Breast cancer Other 40   Social History   Socioeconomic History  . Marital status: Widowed    Spouse name: Not on file  . Number of children: 2  . Years of education: H/S  . Highest education level: 11th grade  Occupational History  . Occupation: Retired  Tobacco Use  . Smoking status: Never Smoker  . Smokeless tobacco: Never Used  Substance and Sexual Activity  . Alcohol use: Not Currently    Comment: occasionally 1 glass of wine  . Drug use: No  . Sexual  activity: Not on file  Other Topics Concern  . Not on file  Social History Narrative  . Not on file   Social Determinants of Health   Financial Resource Strain: Low Risk   . Difficulty of Paying Living Expenses: Not hard at all  Food Insecurity: No Food Insecurity  . Worried About Charity fundraiser in the Last Year: Never true  . Ran Out of Food in the Last Year: Never true  Transportation Needs: No Transportation Needs  . Lack of Transportation (Medical): No  . Lack of Transportation (Non-Medical): No  Physical Activity: Inactive  . Days of Exercise per Week: 0 days  . Minutes of Exercise per Session: 0 min  Stress: No Stress Concern Present  . Feeling of Stress : Not at all  Social Connections: Moderately Isolated  . Frequency of Communication with Friends and Family: More than three times a week  . Frequency of Social Gatherings with Friends and Family: Never  . Attends Religious Services: Never  . Active Member of Clubs or Organizations: No  . Attends Archivist Meetings: Never  . Marital Status: Widowed    Outpatient Encounter Medications as of 03/14/2019  Medication Sig  . amLODipine (NORVASC) 5 MG tablet Take 1 tablet (5 mg total) by mouth daily.  . benazepril (LOTENSIN) 40 MG tablet Take 1 tablet (40 mg total) by mouth daily.  . cholecalciferol (VITAMIN D) 1000 UNITS tablet Take 1,000 Units by mouth daily.   . ferrous sulfate 325 (65 FE) MG tablet TAKE 1 TABLET BY MOUTH EVERY DAY  . metFORMIN (GLUCOPHAGE) 1000 MG tablet TAKE 1 TABLET TWICE DAILY WITH MEALS (Patient taking differently: Take 1,000 mg by mouth daily with breakfast. )  . metoprolol succinate (TOPROL-XL) 50 MG 24 hr tablet TAKE 1 TABLET (50 MG TOTAL) BY MOUTH DAILY. TAKE WITH OR IMMEDIATELY FOLLOWING A MEAL.  Marland Kitchen omeprazole (PRILOSEC) 40 MG capsule TAKE 1 CAPSULE EVERY DAY  . torsemide (DEMADEX) 10 MG tablet Take 10 mg by mouth every morning.  . Blood Glucose Monitoring Suppl (ONE TOUCH ULTRA  SYSTEM KIT) w/Device KIT [place] blood sugar once per day (Patient not taking: Reported on 03/10/2018)  . cloNIDine (CATAPRES) 0.1 MG tablet Take 1 tablet (0.1 mg total) by mouth 2 (two) times daily. (Patient not taking: Reported on 11/13/2018)  . glucose blood test strip Check blood sugar once daily (Patient not taking: Reported on 03/10/2018)  . Lancets (ONETOUCH ULTRASOFT) lancets Check blood sugar once daily (Patient not taking: Reported on 03/10/2018)  . pravastatin (PRAVACHOL) 10 MG tablet Take 1 tablet (10 mg total) by mouth every other day. (Patient not taking: Reported on 03/14/2019)  . [DISCONTINUED] ferrous sulfate 325 (65 FE) MG tablet TAKE 1 TABLET BY MOUTH EVERY DAY   No facility-administered encounter medications on file as of 03/14/2019.    Activities of Daily Living In your  present state of health, do you have any difficulty performing the following activities: 03/14/2019  Hearing? Y  Comment Does not wear hearing aids.  Vision? N  Difficulty concentrating or making decisions? N  Walking or climbing stairs? Y  Comment Due to back pain.  Dressing or bathing? N  Doing errands, shopping? N  Preparing Food and eating ? N  Using the Toilet? N  In the past six months, have you accidently leaked urine? N  Do you have problems with loss of bowel control? N  Managing your Medications? N  Managing your Finances? N  Housekeeping or managing your Housekeeping? N  Some recent data might be hidden    Patient Care Team: Mar Daring, PA-C as PCP - General (Family Medicine) Birder Robson, MD as Referring Physician (Ophthalmology) Murlean Iba, MD (Nephrology)    Assessment:   This is a routine wellness examination for Cristine.  Exercise Activities and Dietary recommendations Current Exercise Habits: The patient does not participate in regular exercise at present, Exercise limited by: orthopedic condition(s)  Goals    . DIET - REDUCE CARBOHYDRATE INTAKE     Recommend  decreasing amount of carbohydrates consumed in daily diet.     . Have 3 meals a day     Recommend to decrease portion sizes by eating 3 small healthy meals and at least 2 healthy snacks per day.       Fall Risk: Fall Risk  03/14/2019 11/13/2018 03/10/2018 02/24/2017 02/02/2016  Falls in the past year? 0 - 1 Yes No  Number falls in past yr: 0 - 0 1 -  Injury with Fall? 0 - 0 No -  Follow up - Falls evaluation completed Falls prevention discussed Falls prevention discussed -    FALL RISK PREVENTION PERTAINING TO THE HOME:  Any stairs in or around the home? No  If so, are there any without handrails? N/A  Home free of loose throw rugs in walkways, pet beds, electrical cords, etc? Yes  Adequate lighting in your home to reduce risk of falls? Yes   ASSISTIVE DEVICES UTILIZED TO PREVENT FALLS:  Life alert? No  Use of a cane, walker or w/c? No  Grab bars in the bathroom? No  Shower chair or bench in shower? No  Elevated toilet seat or a handicapped toilet? No   TIMED UP AND GO:  Was the test performed? No .    Depression Screen PHQ 2/9 Scores 03/14/2019 03/10/2018 02/24/2017 02/24/2017  PHQ - 2 Score 0 2 0 0  PHQ- 9 Score - 5 3 -     Cognitive Function     6CIT Screen 03/14/2019 03/10/2018  What Year? 0 points 0 points  What month? 0 points 0 points  What time? 0 points 0 points  Count back from 20 0 points 0 points  Months in reverse 0 points 0 points  Repeat phrase 0 points 0 points  Total Score 0 0    Immunization History  Administered Date(s) Administered  . Pneumococcal Conjugate-13 10/03/2013  . Pneumococcal Polysaccharide-23 03/25/2011    Qualifies for Shingles Vaccine? Yes . Due for Shingrix. Pt has been advised to call insurance company to determine out of pocket expense. Advised may also receive vaccine at local pharmacy or Health Dept. Verbalized acceptance and understanding.  Tdap: Although this vaccine is not a covered service during a Wellness Exam, does the  patient still wish to receive this vaccine today?  No . Advised may receive this vaccine at local  pharmacy or Health Dept. Aware to provide a copy of the vaccination record if obtained from local pharmacy or Health Dept. Verbalized acceptance and understanding.  Flu Vaccine: Due for Flu vaccine. Does the patient want to receive this vaccine today?  No . Advised may receive this vaccine at local pharmacy or Health Dept. Aware to provide a copy of the vaccination record if obtained from local pharmacy or Health Dept. Verbalized acceptance and understanding.  Pneumococcal Vaccine: Completed series  Screening Tests Health Maintenance  Topic Date Due  . OPHTHALMOLOGY EXAM  02/21/2018  . DEXA SCAN  11/21/2018  . INFLUENZA VACCINE  04/25/2019 (Originally 08/26/2018)  . TETANUS/TDAP  03/13/2020 (Originally 08/25/1959)  . FOOT EXAM  05/04/2019  . HEMOGLOBIN A1C  05/14/2019  . PNA vac Low Risk Adult  Completed    Cancer Screenings:  Colorectal Screening: No longer required.   Mammogram: No longer required.   Bone Density: Completed 11/20/13. Results reflect OSTEOPENIA. Repeat every 5 years.   Lung Cancer Screening: (Low Dose CT Chest recommended if Age 73-80 years, 30 pack-year currently smoking OR have quit w/in 15years.) does not qualify.   Additional Screening:  Dental Screening: Recommended annual dental exams for proper oral hygiene   Community Resource Referral:  CRR required this visit?  No       Plan:  I have personally reviewed and addressed the Medicare Annual Wellness questionnaire and have noted the following in the patient's chart:  A. Medical and social history B. Use of alcohol, tobacco or illicit drugs  C. Current medications and supplements D. Functional ability and status E.  Nutritional status F.  Physical activity G. Advance directives H. List of other physicians I.  Hospitalizations, surgeries, and ER visits in previous 12 months J.  [place] such  as hearing and vision if needed, cognitive and depression L. Referrals and appointments   In addition, I have reviewed and discussed with patient certain preventive protocols, quality metrics, and best practice recommendations. A written personalized care plan for preventive services as well as general preventive health recommendations were provided to patient.   Glendora Score, [place]  2/48/1859 Nurse Health Advisor   Nurse Notes: Pt declined a flu shot this year. Advised pt to schedule an eye exam when able to.

## 2019-03-14 ENCOUNTER — Ambulatory Visit (INDEPENDENT_AMBULATORY_CARE_PROVIDER_SITE_OTHER): Payer: Medicare HMO

## 2019-03-14 ENCOUNTER — Other Ambulatory Visit: Payer: Self-pay

## 2019-03-14 DIAGNOSIS — E2839 Other primary ovarian failure: Secondary | ICD-10-CM | POA: Diagnosis not present

## 2019-03-14 DIAGNOSIS — Z0000 Encounter for general adult medical examination without abnormal findings: Secondary | ICD-10-CM | POA: Diagnosis not present

## 2019-03-14 NOTE — Patient Instructions (Addendum)
[name] , Thank you for taking time to come for your Medicare Wellness Visit. I appreciate your ongoing commitment to your health goals. Please review the following plan we discussed and let me know if I can assist you in the future.   Screening recommendations/referrals: Colonoscopy: No longer required.  Mammogram: No longer required.  Bone Density: Currently due, ordered today. Pt aware off will call re:apt. Recommended yearly ophthalmology/optometry visit for glaucoma screening and checkup Recommended yearly dental visit for hygiene and checkup  Vaccinations: Influenza vaccine: Pt declines today.  Pneumococcal vaccine: Completed series Tdap vaccine: Pt declines today.  Shingles vaccine: Pt declines today.     Advanced directives: Advance directive discussed with you today. Even though you declined this today please call our office should you change your mind and we can give you the proper paperwork for you to fill out.  Conditions/risks identified: Recommend to cut out extra bread in daily diet to help aid with weight loss.   Next appointment: 03/19/19 @ 10:00 AM with [place] 65 Years and Older, Female Preventive care refers to lifestyle choices and visits with your health care provider that can promote health and wellness. What does preventive care include?  A yearly physical exam. This is also called an annual well check.  Dental exams once or twice a year.  Routine eye exams. Ask your health care provider how often you should have your eyes checked.  Personal lifestyle choices, including:  Daily care of your teeth and gums.  Regular physical activity.  Eating a healthy diet.  Avoiding tobacco and drug use.  Limiting alcohol use.  Practicing safe sex.  Taking low-dose aspirin every day.  Taking vitamin and mineral supplements as recommended by your health care provider. What happens during an annual well check? The services and  screenings done by your health care provider during your annual well check will depend on your age, overall health, lifestyle risk factors, and family history of disease. Counseling  Your health care provider may ask you questions about your:  Alcohol use.  Tobacco use.  Drug use.  Emotional well-being.  Home and relationship well-being.  Sexual activity.  Eating habits.  History of falls.  Memory and ability to understand (cognition).  Work and work Statistician.  Reproductive health. Screening  You may have the following tests or measurements:  Height, weight, and BMI.  Blood pressure.  Lipid and cholesterol levels. These may be checked every 5 years, or more frequently if you are over 54 years old.  Skin check.  Lung cancer screening. You may have this screening every year starting at age 44 if you have a 30-pack-year history of smoking and currently smoke or have quit within the past 15 years.  Fecal occult blood test (FOBT) of the stool. You may have this test every year starting at age 63.  Flexible sigmoidoscopy or colonoscopy. You may have a sigmoidoscopy every 5 years or a colonoscopy every 10 years starting at age 46.  Hepatitis C blood test.  Hepatitis B blood test.  Sexually transmitted disease (STD) testing.  Diabetes screening. This is done by checking your blood sugar (glucose) after you have not eaten for a while (fasting). You may have this done every 1-3 years.  Bone density scan. This is done to screen for osteoporosis. You may have this done starting at age 1.  Mammogram. This may be done every 1-2 years. Talk to your health care provider about how often  you should have regular mammograms. Talk with your health care provider about your test results, treatment options, and if necessary, the need for more tests. Vaccines  Your health care provider may recommend certain vaccines, such as:  Influenza vaccine. This is recommended every  year.  Tetanus, diphtheria, and acellular pertussis (Tdap, Td) vaccine. You may need a Td booster every 10 years.  Zoster vaccine. You may need this after age 84.  Pneumococcal 13-valent conjugate (PCV13) vaccine. One dose is recommended after age 87.  Pneumococcal polysaccharide (PPSV23) vaccine. One dose is recommended after age 56. Talk to your health care provider about which screenings and vaccines you need and how often you need them. This information is not intended to replace advice given to you by your health care provider. Make sure you discuss any questions you have with your health care provider. Document Released: 02/07/2015 Document Revised: 10/01/2015 Document Reviewed: 11/12/2014 Elsevier Interactive Patient Education  2017 [place] Prevention in the Home Falls can cause injuries. They can happen to people of all ages. There are many things you can do to make your home safe and to help prevent falls. What can I do on the outside of my home?  Regularly fix the edges of walkways and driveways and fix any cracks.  Remove anything that might make you trip as you walk through a door, such as a raised step or threshold.  Trim any bushes or trees on the path to your home.  Use bright outdoor lighting.  Clear any walking paths of anything that might make someone trip, such as rocks or tools.  Regularly check to see if handrails are loose or broken. Make sure that both sides of any steps have handrails.  Any raised decks and porches should have guardrails on the edges.  Have any leaves, snow, or ice cleared regularly.  Use sand or salt on walking paths during winter.  Clean up any spills in your garage right away. This includes oil or grease spills. What can I do in the bathroom?  Use night lights.  Install grab bars by the toilet and in the tub and shower. Do not use towel bars as grab bars.  Use non-skid mats or decals in the tub or shower.  If you  need to sit down in the shower, use a plastic, non-slip stool.  Keep the floor dry. Clean up any water that spills on the floor as soon as it happens.  Remove soap buildup in the tub or shower regularly.  Attach bath mats securely with double-sided non-slip rug tape.  Do not have throw rugs and other things on the floor that can make you trip. What can I do in the bedroom?  Use night lights.  Make sure that you have a light by your bed that is easy to reach.  Do not use any sheets or blankets that are too big for your bed. They should not hang down onto the floor.  Have a firm chair that has side arms. You can use this for support while you get dressed.  Do not have throw rugs and other things on the floor that can make you trip. What can I do in the kitchen?  Clean up any spills right away.  Avoid walking on wet floors.  Keep items that you use a lot in easy-to-reach places.  If you need to reach something above you, use a strong step stool that has a grab bar.  Keep electrical cords  out of the way.  Do not use floor polish or wax that makes floors slippery. If you must use wax, use non-skid floor wax.  Do not have throw rugs and other things on the floor that can make you trip. What can I do with my stairs?  Do not leave any items on the stairs.  Make sure that there are handrails on both sides of the stairs and use them. Fix handrails that are broken or loose. Make sure that handrails are as long as the stairways.  Check any carpeting to make sure that it is firmly attached to the stairs. Fix any carpet that is loose or worn.  Avoid having throw rugs at the top or bottom of the stairs. If you do have throw rugs, attach them to the floor with carpet tape.  Make sure that you have a light switch at the top of the stairs and the bottom of the stairs. If you do not have them, ask someone to add them for you. What else can I do to help prevent falls?  Wear shoes  that:  Do not have high heels.  Have rubber bottoms.  Are comfortable and fit you well.  Are closed at the toe. Do not wear sandals.  If you use a stepladder:  Make sure that it is fully opened. Do not climb a closed stepladder.  Make sure that both sides of the stepladder are locked into place.  Ask someone to hold it for you, if possible.  Clearly mark and make sure that you can see:  Any grab bars or handrails.  First and last steps.  Where the edge of each step is.  Use tools that help you move around (mobility aids) if they are needed. These include:  Canes.  Walkers.  Scooters.  Crutches.  Turn on the lights when you go into a dark area. Replace any light bulbs as soon as they burn out.  Set up your furniture so you have a clear path. Avoid moving your furniture around.  If any of your floors are uneven, fix them.  If there are any pets around you, be aware of where they are.  Review your medicines with your doctor. Some medicines can make you feel dizzy. This can increase your chance of falling. Ask your doctor what other things that you can do to help prevent falls. This information is not intended to replace advice given to you by your health care provider. Make sure you discuss any questions you have with your health care provider. Document Released: 11/07/2008 Document Revised: 06/19/2015 Document Reviewed: 02/15/2014 Elsevier Interactive Patient Education  2017 [employer].

## 2019-03-16 ENCOUNTER — Ambulatory Visit: Payer: Medicare HMO

## 2019-03-19 ENCOUNTER — Ambulatory Visit (INDEPENDENT_AMBULATORY_CARE_PROVIDER_SITE_OTHER): Payer: Medicare HMO | Admitting: Physician Assistant

## 2019-03-19 ENCOUNTER — Encounter: Payer: Self-pay | Admitting: Physician Assistant

## 2019-03-19 ENCOUNTER — Other Ambulatory Visit: Payer: Self-pay

## 2019-03-19 VITALS — BP 148/90 | HR 90 | Temp 93.2°F | Ht 62.0 in | Wt 200.2 lb

## 2019-03-19 DIAGNOSIS — K219 Gastro-esophageal reflux disease without esophagitis: Secondary | ICD-10-CM

## 2019-03-19 DIAGNOSIS — N1832 Chronic kidney disease, stage 3b: Secondary | ICD-10-CM

## 2019-03-19 DIAGNOSIS — E559 Vitamin D deficiency, unspecified: Secondary | ICD-10-CM

## 2019-03-19 DIAGNOSIS — E119 Type 2 diabetes mellitus without complications: Secondary | ICD-10-CM | POA: Diagnosis not present

## 2019-03-19 DIAGNOSIS — Z0000 Encounter for general adult medical examination without abnormal findings: Secondary | ICD-10-CM

## 2019-03-19 DIAGNOSIS — Z1239 Encounter for other screening for malignant neoplasm of breast: Secondary | ICD-10-CM

## 2019-03-19 DIAGNOSIS — N183 Chronic kidney disease, stage 3 unspecified: Secondary | ICD-10-CM | POA: Diagnosis not present

## 2019-03-19 DIAGNOSIS — E7800 Pure hypercholesterolemia, unspecified: Secondary | ICD-10-CM

## 2019-03-19 DIAGNOSIS — I129 Hypertensive chronic kidney disease with stage 1 through stage 4 chronic kidney disease, or unspecified chronic kidney disease: Secondary | ICD-10-CM | POA: Diagnosis not present

## 2019-03-19 MED ORDER — METFORMIN HCL 1000 MG PO TABS: 1000.0000 mg | ORAL_TABLET | Freq: Every day | ORAL | 1 refills | Status: DC

## 2019-03-19 MED ORDER — BENAZEPRIL HCL 40 MG PO TABS: 40.0000 mg | ORAL_TABLET | Freq: Two times a day (BID) | ORAL | 1 refills | Status: DC

## 2019-03-19 MED ORDER — METOPROLOL SUCCINATE ER 50 MG PO TB24: 50.0000 mg | ORAL_TABLET | Freq: Every day | ORAL | 1 refills | Status: DC

## 2019-03-19 MED ORDER — ROSUVASTATIN CALCIUM 5 MG PO TABS: 5.0000 mg | ORAL_TABLET | Freq: Every day | ORAL | 3 refills | Status: DC

## 2019-03-19 MED ORDER — OMEPRAZOLE 40 MG PO CPDR: 40.0000 mg | DELAYED_RELEASE_CAPSULE | Freq: Every day | ORAL | 1 refills | Status: DC

## 2019-03-19 MED ORDER — CLONIDINE HCL 0.1 MG PO TABS: 0.1000 mg | ORAL_TABLET | Freq: Two times a day (BID) | ORAL | 1 refills | Status: DC

## 2019-03-19 MED ORDER — PRAVASTATIN SODIUM 10 MG PO TABS: 10.0000 mg | ORAL_TABLET | ORAL | 1 refills | Status: DC

## 2019-03-19 MED ORDER — AMLODIPINE BESYLATE 5 MG PO TABS: 5.0000 mg | ORAL_TABLET | Freq: Every day | ORAL | 1 refills | Status: DC

## 2019-03-19 MED ORDER — BENAZEPRIL HCL 40 MG PO TABS: 40.0000 mg | ORAL_TABLET | Freq: Every day | ORAL | 1 refills | Status: DC

## 2019-03-19 NOTE — Patient Instructions (Signed)
Health Maintenance After Age 78 After age 78, you are at a higher risk for certain long-term diseases and infections as well as injuries from falls. Falls are a major cause of broken bones and head injuries in people who are older than age 78. Getting regular preventive care can help to keep you healthy and well. Preventive care includes getting regular testing and making lifestyle changes as recommended by your health care provider. Talk with your health care provider about:  Which screenings and tests you should have. A screening is a test that checks for a disease when you have no symptoms.  A diet and exercise plan that is right for you. What should I know about screenings and tests to prevent falls? Screening and testing are the best ways to find a health problem early. Early diagnosis and treatment give you the best chance of managing medical conditions that are common after age 78. Certain conditions and lifestyle choices may make you more likely to have a fall. Your health care provider may recommend:  Regular vision checks. Poor vision and conditions such as cataracts can make you more likely to have a fall. If you wear glasses, make sure to get your prescription updated if your vision changes.  Medicine review. Work with your health care provider to regularly review all of the medicines you are taking, including over-the-counter medicines. Ask your health care provider about any side effects that may make you more likely to have a fall. Tell your health care provider if any medicines that you take make you feel dizzy or sleepy.  Osteoporosis screening. Osteoporosis is a condition that causes the bones to get weaker. This can make the bones weak and cause them to break more easily.  Blood pressure screening. Blood pressure changes and medicines to control blood pressure can make you feel dizzy.  Strength and balance checks. Your health care provider may recommend certain tests to check your  strength and balance while standing, walking, or changing positions.  Foot health exam. Foot pain and numbness, as well as not wearing proper footwear, can make you more likely to have a fall.  Depression screening. You may be more likely to have a fall if you have a fear of falling, feel emotionally low, or feel unable to do activities that you used to do.  Alcohol use screening. Using too much alcohol can affect your balance and may make you more likely to have a fall. What actions can I take to lower my risk of falls? General instructions  Talk with your health care provider about your risks for falling. Tell your health care provider if: ? You fall. Be sure to tell your health care provider about all falls, even ones that seem minor. ? You feel dizzy, sleepy, or off-balance.  Take over-the-counter and prescription medicines only as told by your health care provider. These include any supplements.  Eat a healthy diet and maintain a healthy weight. A healthy diet includes low-fat dairy products, low-fat (lean) meats, and fiber from whole grains, beans, and lots of fruits and vegetables. Home safety  Remove any tripping hazards, such as rugs, cords, and clutter.  Install safety equipment such as grab bars in bathrooms and safety rails on stairs.  Keep rooms and walkways well-lit. Activity   Follow a regular exercise program to stay fit. This will help you maintain your balance. Ask your health care provider what types of exercise are appropriate for you.  If you need a cane or   walker, use it as recommended by your health care provider.  Wear supportive shoes that have nonskid soles. Lifestyle  Do not drink alcohol if your health care provider tells you not to drink.  If you drink alcohol, limit how much you have: ? 0-1 drink a day for women. ? 0-2 drinks a day for men.  Be aware of how much alcohol is in your drink. In the [place] one drink equals one typical bottle of beer (12  oz), one-half glass of wine (5 oz), or one shot of hard liquor (1 oz).  Do not use any products that contain nicotine or tobacco, such as cigarettes and e-cigarettes. If you need help quitting, ask your health care provider. Summary  Having a healthy lifestyle and getting preventive care can help to protect your health and wellness after age 78.  Screening and testing are the best way to find a health problem early and help you avoid having a fall. Early diagnosis and treatment give you the best chance for managing medical conditions that are more common for people who are older than age 78.  Falls are a major cause of broken bones and head injuries in people who are older than age 78. Take precautions to prevent a fall at home.  Work with your health care provider to learn what changes you can make to improve your health and wellness and to prevent falls. This information is not intended to replace advice given to you by your health care provider. Make sure you discuss any questions you have with your health care provider. Document Revised: 05/04/2018 Document Reviewed: 11/24/2016 Elsevier Patient Education  2020 Elsevier Inc.  

## 2019-03-19 NOTE — Progress Notes (Signed)
Patient: Miranda Olsen, Female    DOB: Jan 29, 1940, 79 y.o.   MRN: [id number] Visit Date: 03/19/2019  Today's Provider: Mar Daring, PA-C   Chief Complaint  Patient presents with  . Annual Exam  . Diabetes  . Hypertension  . Hyperlipidemia   Subjective:   AWE was on 03/14/2019 w/ [facility]   Complete Physical Miranda Olsen is a 79 y.o. female. She feels well. She reports exercising includes walking. She reports she is sleeping well. ----------------------------------------------------------- Last mammogram:03/31/2018 Last colonoscopy:01/27/2015    Diabetes Mellitus Type II, Follow-up:   Lab Results  Component Value Date   HGBA1C 6.5 (A) 11/13/2018   HGBA1C 6.8 (A) 08/04/2018   HGBA1C 6.1 (H) 05/04/2018   Last seen for diabetes 4 months ago.  Management since then includes no changes. She reports good compliance with treatment. She is not having side effects.  Current symptoms include none and have been stable. Home blood sugar records: not checking at home.  Episodes of hypoglycemia? no   Current Insulin Regimen: none Most Recent Eye Exam: 2019; patient aware one due but has been scared to go due to covid; reports she will schedule this year Weight trend: stable Prior visit with dietician: no Current diet: in general, a "healthy" diet  , well balanced, low salt Current exercise: housecleaning and walking  ------------------------------------------------------------------------   Hypertension, follow-up:  BP Readings from Last 3 Encounters:  03/19/19 (!) 148/90  11/13/18 132/72  08/04/18 (!) 161/89    She was last seen for hypertension 4 months ago.  BP at that visit was 132/72. Management since that visit includes no changes.She reports good compliance with treatment. She is not having side effects.  She is exercising. She is adherent to low salt diet.   Outside blood pressures are not being checked. She is experiencing none.  Patient  denies chest pain, chest pressure/discomfort, exertional chest pressure/discomfort, lower extremity edema and palpitations.   Cardiovascular risk factors include advanced age (older than 63 for men, 43 for women), diabetes mellitus, hypertension and obesity (BMI >= 30 kg/m2).  Use of agents associated with hypertension: none.   ------------------------------------------------------------------------    Lipid/Cholesterol, Follow-up:   Last seen for this 4 months ago.  Management since that visit includes patient state she stopped taking her Pravastatin due to leg pain.  Last Lipid Panel:    Component Value Date/Time   CHOL 283 (H) 11/02/2017 1044   TRIG 76 11/02/2017 1044   HDL 77 11/02/2017 1044   CHOLHDL 3.7 11/02/2017 1044   LDLCALC 191 (H) 11/02/2017 1044    She reports poor compliance with treatment. She is not having side effects.   Wt Readings from Last 3 Encounters:  03/19/19 200 lb 3.2 oz (90.8 kg)  11/13/18 196 lb 3.2 oz (89 kg)  08/04/18 195 lb (88.5 kg)   ------------------------------------------------------------------------    Review of Systems  Constitutional: Negative.   HENT: Positive for hearing loss.   Eyes: Negative.   Respiratory: Negative.   Cardiovascular: Negative.   Gastrointestinal: Negative.   Endocrine: Negative.   Genitourinary: Negative.   Musculoskeletal: Positive for arthralgias, back pain and neck pain.  Skin: Negative.   Allergic/Immunologic: Negative.   Neurological: Negative.   Hematological: Negative.   Psychiatric/Behavioral: Negative.     Social History   Socioeconomic History  . Marital status: Widowed    Spouse name: Not on file  . Number of children: 2  . Years of education: H/S  . Highest  education level: 11th grade  Occupational History  . Occupation: Retired  Tobacco Use  . Smoking status: Never Smoker  . Smokeless tobacco: Never Used  Substance and Sexual Activity  . Alcohol use: Not Currently    Comment:  occasionally 1 glass of wine  . Drug use: No  . Sexual activity: Not on file  Other Topics Concern  . Not on file  Social History Narrative  . Not on file   Social Determinants of Health   Financial Resource Strain: Low Risk   . Difficulty of Paying Living Expenses: Not hard at all  Food Insecurity: No Food Insecurity  . Worried About Charity fundraiser in the Last Year: Never true  . Ran Out of Food in the Last Year: Never true  Transportation Needs: No Transportation Needs  . Lack of Transportation (Medical): No  . Lack of Transportation (Non-Medical): No  Physical Activity: Inactive  . Days of Exercise per Week: 0 days  . Minutes of Exercise per Session: 0 min  Stress: No Stress Concern Present  . Feeling of Stress : Not at all  Social Connections: Moderately Isolated  . Frequency of Communication with Friends and Family: More than three times a week  . Frequency of Social Gatherings with Friends and Family: Never  . Attends Religious Services: Never  . Active Member of Clubs or Organizations: No  . Attends Archivist Meetings: Never  . Marital Status: Widowed  Intimate Partner Violence: Not At Risk  . Fear of Current or Ex-Partner: No  . Emotionally Abused: No  . Physically Abused: No  . Sexually Abused: No    Past Medical History:  Diagnosis Date  . Diabetes mellitus without complication ([place])   . Hypercholesterolemia   . Hypertension      Patient Active Problem List   Diagnosis Date Noted  . Low back pain 07/11/2015  . Acute blood loss anemia 01/25/2015  . Chronic kidney disease (CKD), stage III (moderate) 07/31/2014  . Dysfunction of eustachian tube 05/30/2014  . Acid reflux 05/30/2014  . Burning or prickling sensation 05/30/2014  . Deficiency of vitamin B 07/05/2008  . Anemia, deficiency 06/26/2008  . Avitaminosis D 06/26/2008  . Benign hypertension with CKD (chronic kidney disease) stage III 06/15/2008  . Hypercholesteremia 06/12/2008  .  Diabetes mellitus without complication ([place]) 69/62/9528  . Colon, diverticulosis 06/06/2008  . Apnea, sleep 01/26/2007  . History of colon polyps 08/09/2006    Past Surgical History:  Procedure Laterality Date  . COLONOSCOPY WITH PROPOFOL N/A 01/27/2015   Procedure: COLONOSCOPY WITH PROPOFOL;  Surgeon: Josefine Class, MD;  Location: [facility] ENDOSCOPY;  Service: Endoscopy;  Laterality: N/A;  . EYE SURGERY Left 01/21/2009    cataract surgery   . EYE SURGERY Right 04/08/2009   cataract surgery  . REPLACEMENT TOTAL KNEE Left 10/08/2009   Dr. Marry Guan    Her family history includes Breast cancer (age of onset: 63) in an other family member; Breast cancer (age of onset: 55) in her sister; Breast cancer (age of onset: 11) in her maternal aunt; Cancer in her sister; Congestive Heart Failure in her sister and sister; Leukemia in her father; Lung cancer in her brother; Parkinsonism in her mother.   Current Outpatient Medications:  .  amLODipine (NORVASC) 5 MG tablet, Take 1 tablet (5 mg total) by mouth daily., Disp: 90 tablet, Rfl: 1 .  benazepril (LOTENSIN) 40 MG tablet, Take 1 tablet (40 mg total) by mouth 2 (two) times daily., Disp:  180 tablet, Rfl: 1 .  cholecalciferol (VITAMIN D) 1000 UNITS tablet, Take 1,000 Units by mouth daily. , Disp: , Rfl:  .  ferrous sulfate 325 (65 FE) MG tablet, TAKE 1 TABLET BY MOUTH EVERY DAY, Disp: 90 tablet, Rfl: 1 .  metFORMIN (GLUCOPHAGE) 1000 MG tablet, Take 1 tablet (1,000 mg total) by mouth daily with breakfast., Disp: 90 tablet, Rfl: 1 .  metoprolol succinate (TOPROL-XL) 50 MG 24 hr tablet, Take 1 tablet (50 mg total) by mouth daily. Take with or immediately following a meal., Disp: 90 tablet, Rfl: 1 .  omeprazole (PRILOSEC) 40 MG capsule, Take 1 capsule (40 mg total) by mouth daily., Disp: 90 capsule, Rfl: 1 .  torsemide (DEMADEX) 10 MG tablet, Take 10 mg by mouth every morning., Disp: , Rfl:  .  Blood Glucose Monitoring Suppl (ONE TOUCH ULTRA SYSTEM KIT)  w/Device KIT, [place] blood sugar once per day (Patient not taking: Reported on 03/10/2018), Disp: 1 each, Rfl: 0 .  glucose blood test strip, Check blood sugar once daily (Patient not taking: Reported on 03/10/2018), Disp: 100 each, Rfl: 4 .  Lancets (ONETOUCH ULTRASOFT) lancets, Check blood sugar once daily (Patient not taking: Reported on 03/10/2018), Disp: 100 each, Rfl: 3 .  rosuvastatin (CRESTOR) 5 MG tablet, Take 1 tablet (5 mg total) by mouth daily., Disp: 90 tablet, Rfl: 3  Patient Care Team: Mar Daring, PA-C as PCP - General (Family Medicine) Birder Robson, MD as Referring Physician (Ophthalmology) Murlean Iba, MD (Nephrology)     Objective:    Vitals: BP (!) 148/90 (BP Location: Left Arm, Patient Position: Sitting, Cuff Size: Normal)   Pulse 90   Temp (!) 93.2 F (34 C) (Temporal)   Ht '5\' 2"'$  (1.575 m)   Wt 200 lb 3.2 oz (90.8 kg)   SpO2 97%   BMI 36.62 kg/m   Physical Exam Vitals reviewed.  Constitutional:      General: She is not in acute distress.    Appearance: Normal appearance. She is well-developed. She is obese. She is not ill-appearing or diaphoretic.  HENT:     Head: Normocephalic and atraumatic.     Right Ear: Tympanic membrane, ear canal and external ear normal.     Left Ear: Tympanic membrane, ear canal and external ear normal.  Eyes:     General: No scleral icterus.       Right eye: No discharge.        Left eye: No discharge.     Extraocular Movements: Extraocular movements intact.     Conjunctiva/sclera: Conjunctivae normal.     Pupils: Pupils are equal, round, and reactive to light.  Neck:     Thyroid: No thyromegaly.     Vascular: No carotid bruit or JVD.     Trachea: No tracheal deviation.  Cardiovascular:     Rate and Rhythm: Normal rate and regular rhythm.     Pulses: Normal pulses.     Heart sounds: Normal heart sounds. No murmur. No friction rub. No gallop.   Pulmonary:     Effort: Pulmonary effort is normal. No respiratory  distress.     Breath sounds: Normal breath sounds. No wheezing or rales.  Chest:     Chest wall: No tenderness.  Abdominal:     General: Bowel sounds are normal. There is no distension.     Palpations: Abdomen is soft. There is no mass.     Tenderness: There is no abdominal tenderness. There is no guarding or rebound.  Musculoskeletal:        General: No tenderness. Normal range of motion.     Cervical back: Normal range of motion and neck supple.     Right lower leg: No edema.     Left lower leg: No edema.  Lymphadenopathy:     Cervical: No cervical adenopathy.  Skin:    General: Skin is warm and dry.     Capillary Refill: Capillary refill takes less than 2 seconds.     Findings: No rash.  Neurological:     General: No focal deficit present.     Mental Status: She is alert and oriented to person, place, and time. Mental status is at baseline.  Psychiatric:        Mood and Affect: Mood normal.        Behavior: Behavior normal.        Thought Content: Thought content normal.        Judgment: Judgment normal.     Activities of Daily Living In your present state of health, do you have any difficulty performing the following activities: 03/14/2019  Hearing? Y  Comment Does not wear hearing aids.  Vision? N  Difficulty concentrating or making decisions? N  Walking or climbing stairs? Y  Comment Due to back pain.  Dressing or bathing? N  Doing errands, shopping? N  Preparing Food and eating ? N  Using the Toilet? N  In the past six months, have you accidently leaked urine? N  Do you have problems with loss of bowel control? N  Managing your Medications? N  Managing your Finances? N  Housekeeping or managing your Housekeeping? N  Some recent data might be hidden    Fall Risk Assessment Fall Risk  03/14/2019 11/13/2018 03/10/2018 02/24/2017 02/02/2016  Falls in the past year? 0 - 1 Yes No  Number falls in past yr: 0 - 0 1 -  Injury with Fall? 0 - 0 No -  Follow up - Falls  evaluation completed Falls prevention discussed Falls prevention discussed -     Depression Screen PHQ 2/9 Scores 03/14/2019 03/10/2018 02/24/2017 02/24/2017  PHQ - 2 Score 0 2 0 0  PHQ- 9 Score - 5 3 -    6CIT Screen 03/14/2019  What Year? 0 points  What month? 0 points  What time? 0 points  Count back from 20 0 points  Months in reverse 0 points  Repeat phrase 0 points  Total Score 0       Assessment & Plan:    Annual Physical Reviewed patient's Family Medical History Reviewed and updated list of patient's medical providers Assessment of cognitive impairment was done Assessed patient's functional ability Established a written schedule for health screening [place] Completed and Reviewed  Exercise Activities and Dietary recommendations Goals    . DIET - REDUCE CARBOHYDRATE INTAKE     Recommend decreasing amount of carbohydrates consumed in daily diet.     . Have 3 meals a day     Recommend to decrease portion sizes by eating 3 small healthy meals and at least 2 healthy snacks per day.       Immunization History  Administered Date(s) Administered  . Moderna SARS-COVID-2 Vaccination 02/06/2019, 03/06/2019  . Pneumococcal Conjugate-13 10/03/2013  . Pneumococcal Polysaccharide-23 03/25/2011    Health Maintenance  Topic Date Due  . OPHTHALMOLOGY EXAM  02/21/2018  . DEXA SCAN  11/21/2018  . INFLUENZA VACCINE  04/25/2019 (Originally 08/26/2018)  . TETANUS/TDAP  03/13/2020 (Originally  08/25/1960)  . FOOT EXAM  05/04/2019  . HEMOGLOBIN A1C  05/14/2019  . PNA vac Low Risk Adult  Completed     Discussed health benefits of physical activity, and encouraged her to engage in regular exercise appropriate for her age and condition.    1. Annual physical exam Normal exam today. Up to date on screenings and vaccinations desired.   2. Encounter for breast cancer screening using non-mammogram modality Scheduled for 04/02/19.   3. Benign hypertension with  CKD (chronic kidney disease) stage III Elevated today, but patient has only been taking metoprolol XR '50mg'$  and amlodipine 5 mg. Stopped clonidine as it was giving her gerd symptoms. Has been out of benazepril. Will stop clonidine and increase benazepril to '40mg'$  BID. Continue other medications as prescribed. Will check labs as below and f/u pending results. Return in 3 months for recheck of blood pressure.  - CBC with Differential/Platelet - Comprehensive metabolic panel - Lipid panel - Hemoglobin A1c - amLODipine (NORVASC) 5 MG tablet; Take 1 tablet (5 mg total) by mouth daily.  Dispense: 90 tablet; Refill: 1 - metoprolol succinate (TOPROL-XL) 50 MG 24 hr tablet; Take 1 tablet (50 mg total) by mouth daily. Take with or immediately following a meal.  Dispense: 90 tablet; Refill: 1 - benazepril (LOTENSIN) 40 MG tablet; Take 1 tablet (40 mg total) by mouth 2 (two) times daily.  Dispense: 180 tablet; Refill: 1  4. Diabetes mellitus without complication (H[facility]) Stable. Diagnosis pulled for medication refill. Continue current medical treatment plan. Will check labs as below and f/u pending results. - CBC with Differential/Platelet - Comprehensive metabolic panel - Lipid panel - Hemoglobin A1c - metFORMIN (GLUCOPHAGE) 1000 MG tablet; Take 1 tablet (1,000 mg total) by mouth daily with breakfast.  Dispense: 90 tablet; Refill: 1  5. Stage 3b chronic kidney disease Will check labs as below and f/u pending results. Will need to recheck labs when she returns in 3 months as well since I have increased the benazepril today.  - CBC with Differential/Platelet - Comprehensive metabolic panel - Lipid panel - Hemoglobin A1c  6. Hypercholesteremia Pravastatin causing muscle pains. Has not been taking. Will change to rosuvastatin as below. Will check labs as below and f/u pending results. - CBC with Differential/Platelet - Comprehensive metabolic panel - Lipid panel - Hemoglobin A1c - rosuvastatin (CRESTOR) 5  MG tablet; Take 1 tablet (5 mg total) by mouth daily.  Dispense: 90 tablet; Refill: 3  7. Avitaminosis D H/O this and postmenopausal. Will check labs as below and f/u pending results. - Vitamin D (25 hydroxy)  8. Gastroesophageal reflux disease without esophagitis Stable. Diagnosis pulled for medication refill. Continue current medical treatment plan. - omeprazole (PRILOSEC) 40 MG capsule; Take 1 capsule (40 mg total) by mouth daily.  Dispense: 90 capsule; Refill: 1  ------------------------------------------------------------------------------------------------------------    Mar Daring, PA-C  [facility]

## 2019-03-20 ENCOUNTER — Telehealth: Payer: Self-pay

## 2019-03-20 LAB — COMPREHENSIVE METABOLIC PANEL
ALT: 12 IU/L (ref 0–32)
AST: 14 IU/L (ref 0–40)
Albumin/Globulin Ratio: 1.5 (ref 1.2–2.2)
Albumin: 4.2 g/dL (ref 3.7–4.7)
Alkaline Phosphatase: 64 IU/L (ref 39–117)
BUN/Creatinine Ratio: 19 (ref 12–28)
BUN: 28 mg/dL — ABNORMAL HIGH (ref 8–27)
Bilirubin Total: 0.2 mg/dL (ref 0.0–1.2)
CO2: 20 mmol/L (ref 20–29)
Calcium: 9.5 mg/dL (ref 8.7–10.3)
Chloride: 105 mmol/L (ref 96–106)
Creatinine, Ser: 1.49 mg/dL — ABNORMAL HIGH (ref 0.57–1.00)
GFR calc Af Amer: 39 mL/min/{1.73_m2} — ABNORMAL LOW (ref >59)
GFR calc non Af Amer: 34 mL/min/{1.73_m2} — ABNORMAL LOW (ref >59)
Globulin, Total: 2.8 g/dL (ref 1.5–4.5)
Glucose: 95 mg/dL (ref 65–99)
Potassium: 4.4 mmol/L (ref 3.5–5.2)
Sodium: 143 mmol/L (ref 134–144)
Total Protein: 7 g/dL (ref 6.0–8.5)

## 2019-03-20 LAB — CBC WITH DIFFERENTIAL/PLATELET
Basophils Absolute: 0 10*3/uL (ref 0.0–0.2)
Basos: 1 %
EOS (ABSOLUTE): 0.1 10*3/uL (ref 0.0–0.4)
Eos: 1 %
Hematocrit: 32.3 % — ABNORMAL LOW (ref 34.0–46.6)
Hemoglobin: 10.7 g/dL — ABNORMAL LOW (ref 11.1–15.9)
Immature Grans (Abs): 0 10*3/uL (ref 0.0–0.1)
Immature Granulocytes: 0 %
Lymphocytes Absolute: 1.8 10*3/uL (ref 0.7–3.1)
Lymphs: 29 %
MCH: 28.8 pg (ref 26.6–33.0)
MCHC: 33.1 g/dL (ref 31.5–35.7)
MCV: 87 fL (ref 79–97)
Monocytes Absolute: 0.5 10*3/uL (ref 0.1–0.9)
Monocytes: 8 %
Neutrophils Absolute: 3.8 10*3/uL (ref 1.4–7.0)
Neutrophils: 61 %
Platelets: 294 10*3/uL (ref 150–450)
RBC: 3.72 x10E6/uL — ABNORMAL LOW (ref 3.77–5.28)
RDW: 13.4 % (ref 11.7–15.4)
WBC: 6.1 10*3/uL (ref 3.4–10.8)

## 2019-03-20 LAB — LIPID PANEL
Chol/HDL Ratio: 4.9 ratio — ABNORMAL HIGH (ref 0.0–4.4)
Cholesterol, Total: 326 mg/dL — ABNORMAL HIGH (ref 100–199)
HDL: 67 mg/dL (ref >39)
LDL Chol Calc (NIH): 238 mg/dL — ABNORMAL HIGH (ref 0–99)
Triglycerides: 121 mg/dL (ref 0–149)
VLDL Cholesterol Cal: 21 mg/dL (ref 5–40)

## 2019-03-20 LAB — HEMOGLOBIN A1C
Est. average glucose Bld gHb Est-mCnc: 140 mg/dL
Hgb A1c MFr Bld: 6.5 % — ABNORMAL HIGH (ref 4.8–5.6)

## 2019-03-20 LAB — VITAMIN D 25 HYDROXY (VIT D DEFICIENCY, FRACTURES): Vit D, 25-Hydroxy: 26.9 ng/mL — ABNORMAL LOW (ref 30.0–100.0)

## 2019-03-20 NOTE — Telephone Encounter (Signed)
Pt advised.   Thanks,   -Lynnet Hefley  

## 2019-03-20 NOTE — Telephone Encounter (Signed)
-----   Message from Mar Daring, PA-C sent at 03/20/2019  8:29 AM EST ----- Hemoglobin is stable at 10.7. Rest of blood count is normal. Kidney function is stable. Liver enzymes are normal. Sodium, potassium and calcium are normal. Cholesterol has increased compared to last check as expected since being off the pravastatin. I have sent in Rosuvastatin (Crestor) to [facility] to try. A1c is stable at 6.5. Make sure to continue metformin and limit sweets in diet. Also Vit D is borderline low. Recommend to increase OTC Vit D to 2000 IU daily from 1000 IU (if not taking the 1000 IU of Vit D she can restart that).

## 2019-04-02 ENCOUNTER — Telehealth: Payer: Self-pay

## 2019-04-02 ENCOUNTER — Other Ambulatory Visit: Payer: Self-pay | Admitting: Physician Assistant

## 2019-04-02 ENCOUNTER — Ambulatory Visit
Admission: RE | Admit: 2019-04-02 | Discharge: 2019-04-02 | Disposition: A | Discharge status: Home / Self Care | Payer: Medicare HMO | Source: Ambulatory Visit | Attending: Physician Assistant | Admitting: Physician Assistant

## 2019-04-02 DIAGNOSIS — Z1231 Encounter for screening mammogram for malignant neoplasm of breast: Secondary | ICD-10-CM | POA: Diagnosis not present

## 2019-04-02 MED ORDER — TORSEMIDE 10 MG PO TABS: 10.0000 mg | ORAL_TABLET | ORAL | 3 refills | Status: DC

## 2019-04-02 NOTE — Telephone Encounter (Signed)
No answer/voicemail not set up yet. If patient's calls [facility] for the [facility] to give results.

## 2019-04-02 NOTE — Telephone Encounter (Signed)
-----   Message from Mar Daring, [place] sent at 04/02/2019  1:42 PM EST ----- Normal mammogram. Repeat screening in one year.

## 2019-04-02 NOTE — Telephone Encounter (Signed)
[facility] faxed refill request for the following medications:  torsemide (DEMADEX) 10 MG tablet  90 day supply Last Rx: Unknown, do not see that we have written Rx for pt in the past. LOV: 11/13/2018 Please advise. Thanks TNP

## 2019-04-04 ENCOUNTER — Other Ambulatory Visit: Payer: Medicare HMO

## 2019-04-04 NOTE — Telephone Encounter (Signed)
Pt advised.   Thanks,   -Emily Massar  

## 2019-04-10 ENCOUNTER — Ambulatory Visit
Admission: RE | Admit: 2019-04-10 | Discharge: 2019-04-10 | Disposition: A | Discharge status: Home / Self Care | Payer: Medicare HMO | Source: Ambulatory Visit | Attending: Physician Assistant | Admitting: Physician Assistant

## 2019-04-10 ENCOUNTER — Telehealth: Payer: Self-pay

## 2019-04-10 DIAGNOSIS — E2839 Other primary ovarian failure: Secondary | ICD-10-CM | POA: Diagnosis not present

## 2019-04-10 DIAGNOSIS — Z780 Asymptomatic menopausal state: Secondary | ICD-10-CM | POA: Diagnosis not present

## 2019-04-10 DIAGNOSIS — Z13820 Encounter for screening for osteoporosis: Secondary | ICD-10-CM | POA: Diagnosis not present

## 2019-04-10 NOTE — Telephone Encounter (Signed)
Pt advised.   Thanks,   -Raeshaun Simson  

## 2019-04-10 NOTE — Telephone Encounter (Signed)
-----   Message from Mar Daring, PA-C sent at 04/10/2019 11:31 AM EDT ----- Bone density is normal. No need to repeat.

## 2019-04-29 ENCOUNTER — Other Ambulatory Visit: Payer: Self-pay | Admitting: Physician Assistant

## 2019-04-29 DIAGNOSIS — D539 Nutritional anemia, unspecified: Secondary | ICD-10-CM

## 2019-04-29 NOTE — Telephone Encounter (Signed)
Requested Prescriptions  Pending Prescriptions Disp Refills  . ferrous sulfate 325 (65 FE) MG tablet [Pharmacy Med Name: FERROUS SULFATE 325 MG TABLET] 90 tablet 0    Sig: TAKE 1 TABLET BY MOUTH EVERY DAY     Endocrinology:  Minerals - Iron Supplementation Failed - 04/29/2019 12:55 AM      Failed - HGB in normal range and within 360 days    Hemoglobin  Date Value Ref Range Status  03/19/2019 10.7 (L) 11.1 - 15.9 g/dL Final         Failed - HCT in normal range and within 360 days    Hematocrit  Date Value Ref Range Status  03/19/2019 32.3 (L) 34.0 - 46.6 % Final         Failed - RBC in normal range and within 360 days    RBC  Date Value Ref Range Status  03/19/2019 3.72 (L) 3.77 - 5.28 x10E6/uL Final  04/01/2018 3.67 (L) 3.87 - 5.11 MIL/uL Final         Passed - Fe (serum) in normal range and within 360 days    Iron  Date Value Ref Range Status  08/04/2018 74 27 - 139 ug/dL Final   Iron Saturation  Date Value Ref Range Status  08/04/2018 31 15 - 55 % Final         Passed - Ferritin in normal range and within 360 days    Ferritin  Date Value Ref Range Status  08/04/2018 68 15 - 150 ng/mL Final         Passed - Valid encounter within last 12 months    Recent Outpatient Visits          5 months ago CKD stage 3 due to type 2 diabetes mellitus [facility])   [place], [place], PA-C   8 months ago Type 2 diabetes mellitus with stage 3 chronic kidney disease, without long-term current use of insulin [facility])   [place], [place], [place]   12 months ago Essential hypertension   [place], [place], PA-C   1 year ago Benign hypertension with CKD (chronic kidney disease) stage III [facility])   [place], [place], PA-C   1 year ago Benign hypertension with CKD (chronic kidney disease) stage III [facility])   [facility] [place], Clearnce Sorrel, [place]      Future  Appointments            In 1 month [place], Clearnce Sorrel, PA-C [employer]

## 2019-06-15 NOTE — Progress Notes (Signed)
Established patient visit   Patient: Miranda Olsen   DOB: Apr 09, 1940   79 y.o. Female  MRN: [id number] Visit Date: 06/18/2019  Today's healthcare provider: Mar Daring, PA-C   Chief Complaint  Patient presents with  . Follow-up    Hypertension   Subjective    HPI Hypertension, follow-up  BP Readings from Last 3 Encounters:  06/18/19 (!) 151/98  03/19/19 (!) 148/90  11/13/18 132/72   Wt Readings from Last 3 Encounters:  06/18/19 195 lb 9.6 oz (88.7 kg)  03/19/19 200 lb 3.2 oz (90.8 kg)  11/13/18 196 lb 3.2 oz (89 kg)     She was last seen for hypertension 3 months ago.  BP at that visit was 148/90. Management since that visit includes Will stop clonidine and increase benazepril to '40mg'$  BID. Continue other medications as prescribed.  She reports excellent compliance with treatment. She is not having side effects.  She is following a Regular diet.Reports that she has done a lot changes in her diet. She is not exercising. She does not smoke.   Outside blood pressures are reports that at home is 130's-140's/80's.  Symptoms: No chest pain No chest pressure  No palpitations No syncope  No dyspnea No orthopnea  No paroxysmal nocturnal dyspnea No lower extremity edema   She is having more SOB and fatigue over the last few months. Gets very tired with any activity.   Pertinent labs: Lab Results  Component Value Date   CHOL 326 (H) 03/19/2019   HDL 67 03/19/2019   LDLCALC 238 (H) 03/19/2019   TRIG 121 03/19/2019   CHOLHDL 4.9 (H) 03/19/2019   Lab Results  Component Value Date   NA 143 03/19/2019   K 4.4 03/19/2019   CREATININE 1.49 (H) 03/19/2019   GFRNONAA 34 (L) 03/19/2019   GFRAA 39 (L) 03/19/2019   GLUCOSE 95 03/19/2019     The ASCVD Risk score (Goff DC Jr., et al., 2013) failed to calculate for the following reasons:   The valid total cholesterol range is 130 to 320 mg/dL    --------------------------------------------------------------------------------------------------- Diabetes Mellitus Type II, Follow-up  Lab Results  Component Value Date   HGBA1C 6.4 (A) 06/18/2019   HGBA1C 6.5 (H) 03/19/2019   HGBA1C 6.5 (A) 11/13/2018   Wt Readings from Last 3 Encounters:  06/18/19 195 lb 9.6 oz (88.7 kg)  03/19/19 200 lb 3.2 oz (90.8 kg)  11/13/18 196 lb 3.2 oz (89 kg)   Last seen for diabetes 3 months ago.  Management since then includes A1c is stable at 6.5. Make sure to continue metformin and limit sweets in diet. She reports excellent compliance with treatment. She is not having side effects.   Symptoms: Yes fatigue No foot ulcerations  No appetite changes No nausea  No paresthesia of the feet  No polydipsia  No polyuria No visual disturbances   No vomiting     Home blood sugar records: not being checked  Episodes of hypoglycemia? No not being checked.   Current insulin regiment: none Most Recent Eye Exam: Scheduled for 09/10/2019 Current exercise: none Current diet habits: well balanced  Pertinent Labs: Lab Results  Component Value Date   CHOL 326 (H) 03/19/2019   HDL 67 03/19/2019   LDLCALC 238 (H) 03/19/2019   TRIG 121 03/19/2019   CHOLHDL 4.9 (H) 03/19/2019   Lab Results  Component Value Date   NA 143 03/19/2019   K 4.4 03/19/2019   CREATININE 1.49 (H) 03/19/2019  GFRNONAA 34 (L) 03/19/2019   GFRAA 39 (L) 03/19/2019   GLUCOSE 95 03/19/2019     ---------------------------------------------------------------------------------------------------  Patient Active Problem List   Diagnosis Date Noted  . CKD stage 3 due to type 2 diabetes mellitus ([place]) 06/18/2019  . Low back pain 07/11/2015  . Acute blood loss anemia 01/25/2015  . Chronic kidney disease (CKD), stage III (moderate) 07/31/2014  . Dysfunction of eustachian tube 05/30/2014  . Acid reflux 05/30/2014  . Burning or prickling sensation 05/30/2014  . Deficiency of  vitamin B 07/05/2008  . Anemia, deficiency 06/26/2008  . Avitaminosis D 06/26/2008  . Benign hypertension with CKD (chronic kidney disease) stage III 06/15/2008  . Hypercholesteremia 06/12/2008  . Diabetes mellitus without complication ([place]) 17/61/6073  . Colon, diverticulosis 06/06/2008  . Apnea, sleep 01/26/2007  . History of colon polyps 08/09/2006   Past Medical History:  Diagnosis Date  . Diabetes mellitus without complication ([place])   . Hypercholesterolemia   . Hypertension        Medications: Outpatient Medications Prior to Visit  Medication Sig  . amLODipine (NORVASC) 5 MG tablet Take 1 tablet (5 mg total) by mouth daily.  . benazepril (LOTENSIN) 40 MG tablet Take 1 tablet (40 mg total) by mouth 2 (two) times daily.  . cholecalciferol (VITAMIN D) 1000 UNITS tablet Take 1,000 Units by mouth daily. Pt taking 2000 daily  . ferrous sulfate 325 (65 FE) MG tablet TAKE 1 TABLET BY MOUTH EVERY DAY  . metFORMIN (GLUCOPHAGE) 1000 MG tablet Take 1 tablet (1,000 mg total) by mouth daily with breakfast.  . metoprolol succinate (TOPROL-XL) 50 MG 24 hr tablet Take 1 tablet (50 mg total) by mouth daily. Take with or immediately following a meal.  . omeprazole (PRILOSEC) 40 MG capsule Take 1 capsule (40 mg total) by mouth daily.  . rosuvastatin (CRESTOR) 5 MG tablet Take 1 tablet (5 mg total) by mouth daily.  . Blood Glucose Monitoring Suppl (ONE TOUCH ULTRA SYSTEM KIT) w/Device KIT [place] blood sugar once per day (Patient not taking: Reported on 03/10/2018)  . glucose blood test strip Check blood sugar once daily (Patient not taking: Reported on 03/10/2018)  . Lancets (ONETOUCH ULTRASOFT) lancets Check blood sugar once daily (Patient not taking: Reported on 03/10/2018)  . torsemide (DEMADEX) 10 MG tablet Take 1 tablet (10 mg total) by mouth every morning.   No facility-administered medications prior to visit.    Review of Systems  Constitutional: Positive for fatigue.  Respiratory:  Positive for shortness of breath. Negative for chest tightness and wheezing.   Cardiovascular: Positive for leg swelling ("some ankle swelling"; no more than usual). Negative for chest pain and palpitations.  Neurological: Negative for dizziness, light-headedness and headaches.    Last CBC Lab Results  Component Value Date   WBC 6.1 03/19/2019   HGB 10.7 (L) 03/19/2019   HCT 32.3 (L) 03/19/2019   MCV 87 03/19/2019   MCH 28.8 03/19/2019   RDW 13.4 03/19/2019   PLT 294 71/06/2692   Last metabolic panel Lab Results  Component Value Date   GLUCOSE 95 03/19/2019   NA 143 03/19/2019   K 4.4 03/19/2019   CL 105 03/19/2019   CO2 20 03/19/2019   BUN 28 (H) 03/19/2019   CREATININE 1.49 (H) 03/19/2019   GFRNONAA 34 (L) 03/19/2019   GFRAA 39 (L) 03/19/2019   CALCIUM 9.5 03/19/2019   PHOS 3.7 08/04/2018   PROT 7.0 03/19/2019   ALBUMIN 4.2 03/19/2019   LABGLOB 2.8 03/19/2019  AGRATIO 1.5 03/19/2019   BILITOT 0.2 03/19/2019   ALKPHOS 64 03/19/2019   AST 14 03/19/2019   ALT 12 03/19/2019   ANIONGAP 10 04/01/2018   Last lipids Lab Results  Component Value Date   CHOL 326 (H) 03/19/2019   HDL 67 03/19/2019   LDLCALC 238 (H) 03/19/2019   TRIG 121 03/19/2019   CHOLHDL 4.9 (H) 03/19/2019   Last hemoglobin A1c Lab Results  Component Value Date   HGBA1C 6.4 (A) 06/18/2019   Last thyroid functions Lab Results  Component Value Date   TSH 1.320 03/03/2017   Last vitamin D Lab Results  Component Value Date   VD25OH 26.9 (L) 03/19/2019   Last vitamin B12 and Folate Lab Results  Component Value Date   VITAMINB12 455 03/03/2017      Objective    BP (!) 151/98 (BP Location: Right Arm, Patient Position: Sitting, Cuff Size: Large) Comment: 160/90 manually  Pulse (!) 116   Temp (!) 97 F (36.1 C) (Temporal)   Resp 16   Wt 195 lb 9.6 oz (88.7 kg)   BMI 35.78 kg/m  BP Readings from Last 3 Encounters:  06/18/19 (!) 151/98  03/19/19 (!) 148/90  11/13/18 132/72   Wt  Readings from Last 3 Encounters:  06/18/19 195 lb 9.6 oz (88.7 kg)  03/19/19 200 lb 3.2 oz (90.8 kg)  11/13/18 196 lb 3.2 oz (89 kg)      Physical Exam Vitals reviewed.  Constitutional:      General: She is not in acute distress.    Appearance: Normal appearance. She is well-developed. She is obese. She is not ill-appearing or diaphoretic.  Neck:     Thyroid: No thyromegaly.     Vascular: No JVD.     Trachea: No tracheal deviation.  Cardiovascular:     Rate and Rhythm: Normal rate and regular rhythm.     Pulses: Normal pulses.     Heart sounds: Normal heart sounds. No murmur. No friction rub. No gallop.   Pulmonary:     Effort: Pulmonary effort is normal. No respiratory distress.     Breath sounds: Wheezing (expiratory bilateral lower lobes) present. No rales.  Musculoskeletal:     Cervical back: Normal range of motion and neck supple.     Right lower leg: Edema (trace non-pitting around ankle only) present.     Left lower leg: Edema (trace non-pitting around ankle only) present.  Lymphadenopathy:     Cervical: No cervical adenopathy.  Skin:    General: Skin is warm and dry.     Capillary Refill: Capillary refill takes less than 2 seconds.     Coloration: Skin is not jaundiced or pale.     Findings: No bruising or erythema.  Neurological:     General: No focal deficit present.     Mental Status: She is alert. Mental status is at baseline.  Psychiatric:        Mood and Affect: Mood normal.        Thought Content: Thought content normal.       Results for orders placed or performed in visit on 06/18/19  POCT glycosylated hemoglobin (Hb A1C)  Result Value Ref Range   Hemoglobin A1C 6.4 (A) 4.0 - 5.6 %   Est. average glucose Bld gHb Est-mCnc 137     Assessment & Plan     1. Diabetes mellitus without complication (HCC) O8C doing well and improved from 6.5 to 6.4. Continue Metformin '1000mg'$  daily. F/U in 3 months.  2. Shortness of breath EKG today was unremarkable.  Showed NSR rate of 71 with RSR in V1 and non-specific T wave (flat throughout all leads) abnormality, personally reviewed by me. Will check labs as below to r/o iron def anemia or other source for fatigue and SOB. Does have h/o iron def anemia. I will f/u pending labs. If normal, patient will get CXR.  - EKG 12-Lead - CBC w/Diff/Platelet - Fe+TIBC+Fer - B Nat Peptide  3. History of anemia See above medical treatment plan. - CBC w/Diff/Platelet - Fe+TIBC+Fer  4. Benign hypertension with CKD (chronic kidney disease) stage III Will check labs as below to make sure stable since patient having worsening SOB and fatigue. I will f/u pending results. - Renal Function Panel  5. Stage 3b chronic kidney disease See above medical treatment plan. - Renal Function Panel  6. Ventral hernia without obstruction or gangrene Has multiple small fat containing hernias. Ventral hernia (US done in 2019) is one causing burning pain occasionally and getting larger. Patient would like referral for consultation of repair.  - Ambulatory referral to General Surgery  7. CKD stage 3 due to type 2 diabetes mellitus ([place]) See above medical treatment plan for #4 and 5.   8. Wheezing Noted on exam. Patient agreeable to CXR if labs normal. CXR ordered as below. Albuterol given for expiratory wheezing heard on exam. She will call if not improving or if worsening.  - DG Chest 2 View; Future - albuterol (VENTOLIN HFA) 108 (90 Base) MCG/ACT inhaler; Inhale 2 puffs into the lungs every 6 (six) hours as needed for wheezing or shortness of breath.  Dispense: 18 g; Refill: 0   No follow-ups on file.      Re[place]olds [place], PA-C, have reviewed all documentation for this visit. The documentation on 06/18/19 for the exam, diagnosis, procedures, and orders are all accurate and complete.   [place]ye [place]  [facility] [phone] (phone) [phone] (fax)  [place]

## 2019-06-18 ENCOUNTER — Encounter: Payer: Self-pay | Admitting: Physician Assistant

## 2019-06-18 ENCOUNTER — Other Ambulatory Visit: Payer: Self-pay

## 2019-06-18 ENCOUNTER — Ambulatory Visit (INDEPENDENT_AMBULATORY_CARE_PROVIDER_SITE_OTHER): Payer: Medicare Other | Admitting: Physician Assistant

## 2019-06-18 VITALS — BP 151/98 | HR 116 | Temp 97.0°F | Resp 16 | Wt 195.6 lb

## 2019-06-18 DIAGNOSIS — Z862 Personal history of diseases of the blood and blood-forming organs and certain disorders involving the immune mechanism: Secondary | ICD-10-CM | POA: Diagnosis not present

## 2019-06-18 DIAGNOSIS — K439 Ventral hernia without obstruction or gangrene: Secondary | ICD-10-CM

## 2019-06-18 DIAGNOSIS — I129 Hypertensive chronic kidney disease with stage 1 through stage 4 chronic kidney disease, or unspecified chronic kidney disease: Secondary | ICD-10-CM

## 2019-06-18 DIAGNOSIS — N1832 Chronic kidney disease, stage 3b: Secondary | ICD-10-CM

## 2019-06-18 DIAGNOSIS — E119 Type 2 diabetes mellitus without complications: Secondary | ICD-10-CM | POA: Diagnosis not present

## 2019-06-18 DIAGNOSIS — R062 Wheezing: Secondary | ICD-10-CM

## 2019-06-18 DIAGNOSIS — E1122 Type 2 diabetes mellitus with diabetic chronic kidney disease: Secondary | ICD-10-CM

## 2019-06-18 DIAGNOSIS — R0602 Shortness of breath: Secondary | ICD-10-CM | POA: Diagnosis not present

## 2019-06-18 DIAGNOSIS — N183 Chronic kidney disease, stage 3 unspecified: Secondary | ICD-10-CM

## 2019-06-18 LAB — POCT GLYCOSYLATED HEMOGLOBIN (HGB A1C)
Est. average glucose Bld gHb Est-mCnc: 137
Hemoglobin A1C: 6.4 % — AB (ref 4.0–5.6)

## 2019-06-18 MED ORDER — ALBUTEROL SULFATE HFA 108 (90 BASE) MCG/ACT IN AERS: 2.0000 | INHALATION_SPRAY | Freq: Four times a day (QID) | RESPIRATORY_TRACT | 0 refills | Status: DC | PRN

## 2019-06-19 ENCOUNTER — Telehealth: Payer: Self-pay

## 2019-06-19 DIAGNOSIS — E1122 Type 2 diabetes mellitus with diabetic chronic kidney disease: Secondary | ICD-10-CM

## 2019-06-19 DIAGNOSIS — N1832 Chronic kidney disease, stage 3b: Secondary | ICD-10-CM

## 2019-06-19 DIAGNOSIS — N183 Chronic kidney disease, stage 3 unspecified: Secondary | ICD-10-CM

## 2019-06-19 LAB — IRON,TIBC AND FERRITIN PANEL
Ferritin: 162 ng/mL — ABNORMAL HIGH (ref 15–150)
Iron Saturation: 36 % (ref 15–55)
Iron: 88 ug/dL (ref 27–139)
Total Iron Binding Capacity: 247 ug/dL — ABNORMAL LOW (ref 250–450)
UIBC: 159 ug/dL (ref 118–369)

## 2019-06-19 LAB — CBC WITH DIFFERENTIAL/PLATELET
Basophils Absolute: 0 10*3/uL (ref 0.0–0.2)
Basos: 1 %
EOS (ABSOLUTE): 0.1 10*3/uL (ref 0.0–0.4)
Eos: 2 %
Hematocrit: 34.3 % (ref 34.0–46.6)
Hemoglobin: 11.3 g/dL (ref 11.1–15.9)
Immature Grans (Abs): 0 10*3/uL (ref 0.0–0.1)
Immature Granulocytes: 0 %
Lymphocytes Absolute: 1.2 10*3/uL (ref 0.7–3.1)
Lymphs: 24 %
MCH: 28.6 pg (ref 26.6–33.0)
MCHC: 32.9 g/dL (ref 31.5–35.7)
MCV: 87 fL (ref 79–97)
Monocytes Absolute: 0.5 10*3/uL (ref 0.1–0.9)
Monocytes: 11 %
Neutrophils Absolute: 3 10*3/uL (ref 1.4–7.0)
Neutrophils: 62 %
Platelets: 213 10*3/uL (ref 150–450)
RBC: 3.95 x10E6/uL (ref 3.77–5.28)
RDW: 12.7 % (ref 11.7–15.4)
WBC: 4.9 10*3/uL (ref 3.4–10.8)

## 2019-06-19 LAB — RENAL FUNCTION PANEL
Albumin: 4.4 g/dL (ref 3.7–4.7)
BUN/Creatinine Ratio: 14 (ref 12–28)
BUN: 23 mg/dL (ref 8–27)
CO2: 20 mmol/L (ref 20–29)
Calcium: 9.6 mg/dL (ref 8.7–10.3)
Chloride: 99 mmol/L (ref 96–106)
Creatinine, Ser: 1.6 mg/dL — ABNORMAL HIGH (ref 0.57–1.00)
GFR calc Af Amer: 36 mL/min/{1.73_m2} — ABNORMAL LOW (ref >59)
GFR calc non Af Amer: 31 mL/min/{1.73_m2} — ABNORMAL LOW (ref >59)
Glucose: 107 mg/dL — ABNORMAL HIGH (ref 65–99)
Phosphorus: 4.1 mg/dL (ref 3.0–4.3)
Potassium: 4.4 mmol/L (ref 3.5–5.2)
Sodium: 137 mmol/L (ref 134–144)

## 2019-06-19 LAB — BRAIN NATRIURETIC PEPTIDE: BNP: 9.2 pg/mL (ref 0.0–100.0)

## 2019-06-19 NOTE — Telephone Encounter (Signed)
-----   Message from Mar Daring, PA-C sent at 06/19/2019 12:03 PM EDT ----- Blood count is normal. Hemoglobin has improved. Iron stores are good. Kidney function has declined. Do you have any f/u appts with Dr. Candiss Norse? Would recommend following up with him. Sodium, potassium and calcium are normal. BNP (cardiac enzyme) is still pending.

## 2019-06-19 NOTE — Telephone Encounter (Signed)
-----   Message from Mar Daring, PA-C sent at 06/19/2019  1:43 PM EDT ----- BNP is normal.

## 2019-06-19 NOTE — Telephone Encounter (Signed)
NA/voicemail not set up yet. If patient calls back OK for the [facility] nurse to give results.

## 2019-06-20 NOTE — Addendum Note (Signed)
Addended by: Mar Daring on: 06/20/2019 10:09 AM   Modules accepted: Orders

## 2019-06-20 NOTE — Telephone Encounter (Signed)
Pt given results per Fenton Malling, " Blood count is normal. Hemoglobin has improved. Iron stores are good. Kidney function has declined. Do you have any f/u appts with Dr. Candiss Norse? Would recommend following up with him. Sodium, potassium and calcium are normal. BNP (cardiac enzyme) is normal"; the pt verbalized understanding, but she would like for the office to set up the appt with Dr Candiss Norse for her; she can be contacted at [phone]; will route to office for final disposition.

## 2019-06-20 NOTE — Telephone Encounter (Signed)
Referral placed for patient.  

## 2019-06-21 ENCOUNTER — Other Ambulatory Visit: Payer: Self-pay

## 2019-06-21 ENCOUNTER — Encounter: Payer: Self-pay | Admitting: Surgery

## 2019-06-21 ENCOUNTER — Ambulatory Visit (INDEPENDENT_AMBULATORY_CARE_PROVIDER_SITE_OTHER): Payer: Medicare Other | Admitting: Surgery

## 2019-06-21 VITALS — BP 168/92 | HR 74 | Temp 97.3°F | Ht 62.0 in | Wt 194.4 lb

## 2019-06-21 DIAGNOSIS — M6208 Separation of muscle (nontraumatic), other site: Secondary | ICD-10-CM | POA: Insufficient documentation

## 2019-06-21 DIAGNOSIS — K439 Ventral hernia without obstruction or gangrene: Secondary | ICD-10-CM

## 2019-06-21 DIAGNOSIS — E669 Obesity, unspecified: Secondary | ICD-10-CM | POA: Diagnosis not present

## 2019-06-21 DIAGNOSIS — Z6835 Body mass index (BMI) 35.0-35.9, adult: Secondary | ICD-10-CM

## 2019-06-21 DIAGNOSIS — K429 Umbilical hernia without obstruction or gangrene: Secondary | ICD-10-CM

## 2019-06-21 DIAGNOSIS — K4020 Bilateral inguinal hernia, without obstruction or gangrene, not specified as recurrent: Secondary | ICD-10-CM

## 2019-06-21 NOTE — Progress Notes (Signed)
Patient ID: Miranda Olsen, female   DOB: March 14, 1940, 79 y.o.   MRN: [id number]  Chief Complaint: Ventral hernia  History of Present Illness Miranda Olsen is a 79 y.o. female with a ventral hernia diagnosed on CT scan about a year ago.  She reports a burning sensation with a degree of increased/constant bulging.  She has no history of constipation or diarrhea.  She denies any history of nausea, vomiting, fevers or chills.  She reports her activity is quite limited due to her scoliosis, and her mobility is significantly diminished. We reviewed her CT scan from last year together with her, showing her multiple images and discussing their significance.  She reports that nothing in particular exacerbates her discomfort, and she confirms that she has had no surgery to any of these areas.  She denies any discomfort in the umbilical region.  She denies any discomfort in the groin areas.  Past Medical History Past Medical History:  Diagnosis Date  . Diabetes mellitus without complication ([place])   . Hypercholesterolemia   . Hypertension       Past Surgical History:  Procedure Laterality Date  . COLONOSCOPY WITH PROPOFOL N/A 01/27/2015   Procedure: COLONOSCOPY WITH PROPOFOL;  Surgeon: Josefine Class, MD;  Location: [facility] ENDOSCOPY;  Service: Endoscopy;  Laterality: N/A;  . EYE SURGERY Left 01/21/2009    cataract surgery   . EYE SURGERY Right 04/08/2009   cataract surgery  . REPLACEMENT TOTAL KNEE Left 10/08/2009   Dr. Marry Guan    Allergies  Allergen Reactions  . Clonidine Derivatives Other (See Comments)    Current Outpatient Medications  Medication Sig Dispense Refill  . albuterol (VENTOLIN HFA) 108 (90 Base) MCG/ACT inhaler Inhale 2 puffs into the lungs every 6 (six) hours as needed for wheezing or shortness of breath. 18 g 0  . amLODipine (NORVASC) 5 MG tablet Take 1 tablet (5 mg total) by mouth daily. 90 tablet 1  . benazepril (LOTENSIN) 40 MG tablet Take 1 tablet (40 mg total) by  mouth 2 (two) times daily. 180 tablet 1  . cholecalciferol (VITAMIN D) 1000 UNITS tablet Take 1,000 Units by mouth daily. Pt taking 2000 daily    . ferrous sulfate 325 (65 FE) MG tablet TAKE 1 TABLET BY MOUTH EVERY DAY 90 tablet 0  . metFORMIN (GLUCOPHAGE) 1000 MG tablet Take 1 tablet (1,000 mg total) by mouth daily with breakfast. 90 tablet 1  . metoprolol succinate (TOPROL-XL) 50 MG 24 hr tablet Take 1 tablet (50 mg total) by mouth daily. Take with or immediately following a meal. 90 tablet 1  . omeprazole (PRILOSEC) 40 MG capsule Take 1 capsule (40 mg total) by mouth daily. 90 capsule 1  . rosuvastatin (CRESTOR) 5 MG tablet Take 1 tablet (5 mg total) by mouth daily. 90 tablet 3  . torsemide (DEMADEX) 10 MG tablet Take 1 tablet (10 mg total) by mouth every morning. 90 tablet 3  . Blood Glucose Monitoring Suppl (ONE TOUCH ULTRA SYSTEM KIT) w/Device KIT [place] blood sugar once per day (Patient not taking: Reported on 06/21/2019) 1 each 0  . glucose blood test strip Check blood sugar once daily (Patient not taking: Reported on 06/21/2019) 100 each 4  . Lancets (ONETOUCH ULTRASOFT) lancets Check blood sugar once daily (Patient not taking: Reported on 06/21/2019) 100 each 3   No current facility-administered medications for this visit.    Family History Family History  Problem Relation Age of Onset  . Parkinsonism Mother   . Leukemia  Father   . Cancer Sister        breast  . Breast cancer Sister 22  . Congestive Heart Failure Sister   . Congestive Heart Failure Sister   . Lung cancer Brother   . Breast cancer Maternal Aunt 60  . Breast cancer Other 20      Social History Social History   Tobacco Use  . Smoking status: Never Smoker  . Smokeless tobacco: Never Used  Substance Use Topics  . Alcohol use: Not Currently    Comment: occasionally 1 glass of wine  . Drug use: No        Review of Systems  Constitutional: Negative for chills, fever and weight loss.  HENT: Negative for  ear pain.   Eyes: Negative for pain.  Respiratory: Negative for cough and wheezing.   Cardiovascular: Negative for chest pain.  Gastrointestinal: Negative for constipation, diarrhea, nausea and vomiting.  Genitourinary: Negative.   Musculoskeletal: Negative.   Skin: Negative.   Neurological: Negative.   Endo/Heme/Allergies: Negative.       Physical Exam Blood pressure (!) 168/92, pulse 74, temperature (!) 97.3 F (36.3 C), temperature source Temporal, height '5\' 2"'$  (1.575 m), weight 194 lb 6.4 oz (88.2 kg), SpO2 98 %. Last Weight  Most recent update: 06/21/2019 11:02 AM   Weight  88.2 kg (194 lb 6.4 oz)            CONSTITUTIONAL: Well developed, and nourished, appropriately responsive and aware without distress.   EYES: Sclera non-icteric.   EARS, NOSE, MOUTH AND THROAT: Mask worn.    Hearing is intact to voice.  NECK: Trachea is midline, and there is no jugular venous distension.  LYMPH NODES:  Lymph nodes in the neck are not enlarged. RESPIRATORY:  Lungs are clear, and breath sounds are equal bilaterally. Normal respiratory effort without pathologic use of accessory muscles. CARDIOVASCULAR: Heart is regular in rate and rhythm. GI: The abdomen is soft, nontender, and nondistended.  It is well rounded, while sitting there is a remarkable supraumbilical bulge.  When lying it is a rectus diastases with minimal epigastric defect present.  There is an umbilical hernia that is nontender.  There were no palpable masses. I did not appreciate hepatosplenomegaly. There were normal bowel sounds.  MUSCULOSKELETAL:  Symmetrical muscle tone appreciated in all four extremities.    SKIN: Skin turgor is normal. No pathologic skin lesions appreciated.  NEUROLOGIC:  Motor and sensation appear grossly normal.  Cranial nerves are grossly without defect. PSYCH:  Alert and oriented to person, place and time. Affect is appropriate for situation.  Data Reviewed I have personally reviewed what is  currently available of the patient's imaging, recent labs and medical records.   Labs:  CBC Latest Ref Rng & Units 06/18/2019 03/19/2019 08/04/2018  WBC 3.4 - 10.8 x10E3/uL 4.9 6.1 4.4  Hemoglobin 11.1 - 15.9 g/dL 11.3 10.7(L) 10.1(L)  Hematocrit 34.0 - 46.6 % 34.3 32.3(L) 30.6(L)  Platelets 150 - 450 x10E3/uL 213 294 249   CMP Latest Ref Rng & Units 06/18/2019 03/19/2019 08/04/2018  Glucose 65 - 99 mg/dL 107(H) 95 106(H)  BUN 8 - 27 mg/dL 23 28(H) 25  Creatinine 0.57 - 1.00 mg/dL 1.60(H) 1.49(H) 1.51(H)  Sodium 134 - 144 mmol/L 137 143 137  Potassium 3.5 - 5.2 mmol/L 4.4 4.4 4.8  Chloride 96 - 106 mmol/L 99 105 97  CO2 20 - 29 mmol/L '20 20 24  '$ Calcium 8.7 - 10.3 mg/dL 9.6 9.5 9.3  Total Protein 6.0 -  8.5 g/dL - 7.0 -  Total Bilirubin 0.0 - 1.2 mg/dL - 0.2 -  Alkaline Phos 39 - 117 IU/L - 64 -  AST 0 - 40 IU/L - 14 -  ALT 0 - 32 IU/L - 12 -      Imaging: Radiology review:  CLINICAL DATA:  Diffuse abdominal pain for the past 6 days. Rectal bleeding today. Clinical concern for diverticulitis.  EXAM: CT ABDOMEN AND PELVIS WITH CONTRAST  TECHNIQUE: Multidetector CT imaging of the abdomen and pelvis was performed using the standard protocol following bolus administration of intravenous contrast.  CONTRAST:  15m OMNIPAQUE IOHEXOL 300 MG/ML  SOLN  COMPARISON:  Limited abdomen ultrasound dated 03/07/2017.  FINDINGS: Lower chest: Minimal atelectasis or scarring at the left lung base. Mildly enlarged heart. Small to moderate-sized hiatal hernia.  Hepatobiliary: Small right lobe liver probable cyst. Unremarkable gallbladder.  Pancreas: Unremarkable. No pancreatic ductal dilatation or surrounding inflammatory changes.  Spleen: Normal in size without focal abnormality.  Adrenals/Urinary Tract: Normal appearing adrenal glands. Multiple small bilateral renal cysts. Unremarkable ureters and urinary bladder.  Stomach/Bowel: Small to moderate-sized hiatal hernia.  Diverticula throughout the colon without evidence of diverticulitis.  Vascular/Lymphatic: Atheromatous arterial calcifications without aneurysm. The infrarenal abdominal aorta is just below the upper limit of normal in size at 2.9 cm. Marked tortuosity of the thoracic and abdominal aorta. No enlarged lymph nodes.  Reproductive: 2.7 cm exophytic posterior uterine mass on the right pending a coarse calcification. Normal appearing ovaries.  Other: Small to moderate-sized bilateral inguinal hernias containing fat. Small to moderate-sized umbilical hernia containing fat.  Musculoskeletal: Marked thoracolumbar scoliosis with extensive degenerative changes.  IMPRESSION: 1. No acute abnormality. 2. Colonic diverticulosis. 3. Small to moderate-sized hiatal hernia. 4. Small to moderate-sized umbilical hernia containing fat and small to moderate-sized bilateral inguinal hernias containing fat. 5. 2.7 cm exophytic uterine fibroid on the right.   Electronically Signed   By: SClaudie ReveringM.D.   On: 04/01/2018 12:23  Within last 24 hrs: No results found.  Assessment    What is not noted in the report above is the diminished integrity of the linea alba in the area of concern.  There appears to be an epigastric hernia and process in the midst of her diastases recti.  It is fat filled on her CT scan.  Her umbilical hernia is also fat filled, and is entirely asymptomatic.  I believe the area of concern may be more tender due to her back issues and relying upon her core musculature.  There is no remarkable bulge that would possibly involve any viscera.  The diastases alone may be a source of the mild discomfort she feels.  Patient Active Problem List   Diagnosis Date Noted  . Epigastric hernia 06/21/2019  . Diastasis recti 06/21/2019  . Class 2 obesity with body mass index (BMI) of 35.0 to 35.9 in adult 06/21/2019  . Bilateral inguinal hernia 06/21/2019  . Umbilical hernia without  obstruction and without gangrene 06/21/2019  . CKD stage 3 due to type 2 diabetes mellitus ([place] 06/18/2019  . Low back pain 07/11/2015  . Acute blood loss anemia 01/25/2015  . Chronic kidney disease (CKD), stage III (moderate) 07/31/2014  . Dysfunction of eustachian tube 05/30/2014  . Acid reflux 05/30/2014  . Burning or prickling sensation 05/30/2014  . Deficiency of vitamin B 07/05/2008  . Anemia, deficiency 06/26/2008  . Avitaminosis D 06/26/2008  . Benign hypertension with CKD (chronic kidney disease) stage III 06/15/2008  . Hypercholesteremia 06/12/2008  .  Diabetes mellitus without complication ([place]) 44/97/5300  . Colon, diverticulosis 06/06/2008  . Apnea, sleep 01/26/2007  . History of colon polyps 08/09/2006    Plan    We discussed in detail the surgical options.  I do not recommend proceeding with any type of mesh repair as there is no intestinal content involvement, and I feel her pain/discomfort cannot be reasonably anticipated to resolve with the repair.  I am strongly suspicious that repair may only exacerbate her pain.  We discussed the elements of a good minimally invasive repair.  Advised that weight loss may be the best way to diminish her discomfort.  And we reached a full agreement that to proceed with any intervention at this time.  I will be glad to see this lady back should she have any change of heart or worsening of her current discomfort syndrome.  Face-to-face time spent with the patient and accompanying care providers(if present) was 45 minutes, with more than 50% of the time spent counseling, educating, and coordinating care of the patient.      Ronny Bacon M.D., FACS 06/21/2019, 2:51 PM

## 2019-06-21 NOTE — Assessment & Plan Note (Signed)
Observation  

## 2019-06-21 NOTE — Patient Instructions (Addendum)
Dr.Rodenberg discussed with patient all the risks factors and explained the surgical treatment with patient. He recommends patient to continue to watch the area. If she notices area of concerns worsens, to give our office a call to move forward surgical treatment. He recommends weight loss to patient to help with relieving pressure off the area of concern.  Ventral Hernia  A ventral hernia is a bulge of tissue from inside the abdomen that pushes through a weak area of the muscles that form the front wall of the abdomen. The tissues inside the abdomen are inside a sac (peritoneum). These tissues include the small intestine, large intestine, and the fatty tissue that covers the intestines (omentum). Sometimes, the bulge that forms a hernia contains intestines. Other hernias contain only fat. Ventral hernias do not go away without surgical treatment. There are several types of ventral hernias. You may have:  A hernia at an incision site from previous abdominal surgery (incisional hernia).  A hernia just above the belly button (epigastric hernia), or at the belly button (umbilical hernia). These types of hernias can develop from heavy lifting or straining.  A hernia that comes and goes (reducible hernia). It may be visible only when you lift or strain. This type of hernia can be pushed back into the abdomen (reduced).  A hernia that traps abdominal tissue inside the hernia (incarcerated hernia). This type of hernia does not reduce.  A hernia that cuts off blood flow to the tissues inside the hernia (strangulated hernia). The tissues can start to die if this happens. This is a very painful bulge that cannot be reduced. A strangulated hernia is a medical emergency. What are the causes? This condition is caused by abdominal tissue putting pressure on an area of weakness in the abdominal muscles. What increases the risk? The following factors may make you more likely to develop this condition:  Being  female.  Being 79 or older.  Being overweight or obese.  Having had previous abdominal surgery, especially if there was an infection after surgery.  Having had an injury to the abdominal wall.  Having had several pregnancies.  Having a buildup of fluid inside the abdomen (ascites). What are the signs or symptoms? The only symptom of a ventral hernia may be a painless bulge in the abdomen. A reducible hernia may be visible only when you strain, cough, or lift. Other symptoms may include:  Dull pain.  A feeling of pressure. Signs and symptoms of a strangulated hernia may include:  Increasing pain.  Nausea and vomiting.  Pain when pressing on the hernia.  The skin over the hernia turning red or purple.  Constipation.  Blood in the stool (feces). How is this diagnosed? This condition may be diagnosed based on:  Your symptoms.  Your medical history.  A physical exam. You may be asked to cough or strain while standing. These actions increase the pressure inside your abdomen and force the hernia through the opening in your muscles. Your health care provider may try to reduce the hernia by pressing on it.  Imaging studies, such as an ultrasound or CT scan. How is this treated? This condition is treated with surgery. If you have a strangulated hernia, surgery is done as soon as possible. If your hernia is small and not incarcerated, you may be asked to lose some weight before surgery. Follow these instructions at home:  Follow instructions from your health care provider about eating or drinking restrictions.  If you are overweight, your  health care provider may recommend that you increase your activity level and eat a healthier diet.  Do not lift anything that is heavier than 10 lb (4.5 kg).  Return to your normal activities as told by your health care provider. Ask your health care provider what activities are safe for you. You may need to avoid activities that increase  pressure on your hernia.  Take over-the-counter and prescription medicines only as told by your health care provider.  Keep all follow-up visits as told by your health care provider. This is important. Contact a health care provider if:  Your hernia gets larger.  Your hernia becomes painful. Get help right away if:  Your hernia becomes increasingly painful.  You have pain along with any of the following: ? Changes in skin color in the area of the hernia. ? Nausea. ? Vomiting. ? Fever. Summary  A ventral hernia is a bulge of tissue from inside the abdomen that pushes through a weak area of the muscles that form the front wall of the abdomen.  This condition is treated with surgery, which may be urgent depending on your hernia.  Do not lift anything that is heavier than 10 lb (4.5 kg), and follow activity instructions from your health care provider. This information is not intended to replace advice given to you by your health care provider. Make sure you discuss any questions you have with your health care provider. Document Revised: 02/23/2017 Document Reviewed: 08/02/2016 Elsevier Patient Education  [place].

## 2019-07-12 ENCOUNTER — Other Ambulatory Visit: Payer: Self-pay | Admitting: Physician Assistant

## 2019-07-12 DIAGNOSIS — I129 Hypertensive chronic kidney disease with stage 1 through stage 4 chronic kidney disease, or unspecified chronic kidney disease: Secondary | ICD-10-CM

## 2019-07-12 MED ORDER — AMLODIPINE BESYLATE 5 MG PO TABS: 5.0000 mg | ORAL_TABLET | Freq: Every day | ORAL | 1 refills | Status: DC

## 2019-07-12 MED ORDER — BENAZEPRIL HCL 40 MG PO TABS: 40.0000 mg | ORAL_TABLET | Freq: Two times a day (BID) | ORAL | 1 refills | Status: DC

## 2019-07-12 NOTE — Telephone Encounter (Signed)
Patient requesting amLODipine (NORVASC) 5 MG tablet and benazepril (LOTENSIN) 40 MG tablet , informed patient please allow 48 to 72 hour turn around time.   Patient also wants noted she no longer uses [facility] Mail order and for now please send all medications to the pharmacy noted below    [id number] - [place], [place] - [address] Phone:  [phone]  Fax:  [phone]

## 2019-08-08 DIAGNOSIS — R600 Localized edema: Secondary | ICD-10-CM | POA: Insufficient documentation

## 2019-08-09 DIAGNOSIS — S92353A Displaced fracture of fifth metatarsal bone, unspecified foot, initial encounter for closed fracture: Secondary | ICD-10-CM | POA: Insufficient documentation

## 2019-08-14 ENCOUNTER — Other Ambulatory Visit: Payer: Self-pay | Admitting: Physician Assistant

## 2019-08-14 DIAGNOSIS — I129 Hypertensive chronic kidney disease with stage 1 through stage 4 chronic kidney disease, or unspecified chronic kidney disease: Secondary | ICD-10-CM

## 2019-08-14 DIAGNOSIS — K219 Gastro-esophageal reflux disease without esophagitis: Secondary | ICD-10-CM

## 2019-08-14 MED ORDER — METOPROLOL SUCCINATE ER 50 MG PO TB24: 50.0000 mg | ORAL_TABLET | Freq: Every day | ORAL | 1 refills | Status: DC

## 2019-08-14 MED ORDER — OMEPRAZOLE 40 MG PO CPDR: 40.0000 mg | DELAYED_RELEASE_CAPSULE | Freq: Every day | ORAL | 1 refills | Status: DC

## 2019-08-14 NOTE — Telephone Encounter (Signed)
Medication Refill - Medication: metoprolol, omeprazole   Has the patient contacted their pharmacy? Yes.   (Agent: If no, request that the patient contact the pharmacy for the refill.) (Agent: If yes, when and what did the pharmacy advise?)  Preferred Pharmacy (with phone number or street name):  [id number] - [place], [place] - [address]  [address] [place] [address]  Phone: [phone] Fax: [phone]  Hours: Not open 24 hours     Agent: Please be advised that RX refills may take up to 3 business days. We ask that you follow-up with your pharmacy.

## 2019-09-10 LAB — HM DIABETES EYE EXAM

## 2019-09-11 ENCOUNTER — Telehealth: Payer: Self-pay

## 2019-09-11 ENCOUNTER — Encounter: Payer: Self-pay | Admitting: Physician Assistant

## 2019-09-11 NOTE — Telephone Encounter (Signed)
Copied from [place] [id number]. Topic: General - Inquiry >> Sep 11, 2019 11:59 AM Alease Frame wrote: Reason for CRM: Pt is calling to see when Marlyn Corporal will be able to sign paper for handicap plate . Pt can drop off paperwork for a signature . Please reach out to pt

## 2019-09-12 NOTE — Telephone Encounter (Signed)
Pt is going to bring the paperwork to her next appointment next week.   Thanks,   -Mickel Baas

## 2019-09-20 NOTE — Progress Notes (Signed)
Established patient visit   Patient: Miranda Olsen   DOB: Apr 08, 1940   79 y.o. Female  MRN: [id number] Visit Date: 09/21/2019  Today's healthcare provider: Mar Daring, PA-C   Chief Complaint  Patient presents with  . Follow-up   Subjective    HPI  Diabetes Mellitus Type II, Follow-up  Lab Results  Component Value Date   HGBA1C 6.6 (A) 09/21/2019   HGBA1C 6.4 (A) 06/18/2019   HGBA1C 6.5 (H) 03/19/2019   Wt Readings from Last 3 Encounters:  09/21/19 196 lb 3.2 oz (89 kg)  06/21/19 194 lb 6.4 oz (88.2 kg)  06/18/19 195 lb 9.6 oz (88.7 kg)   Last seen for diabetes 3 months ago.  Management since then includes no changes. She reports excellent compliance with treatment. She is not having side effects.  Symptoms: No fatigue No foot ulcerations  No appetite changes No nausea  No paresthesia of the feet  No polydipsia  No polyuria No visual disturbances   No vomiting     Home blood sugar records: not being checked   Current insulin regiment: none Most Recent Eye Exam: AEC 09/10/19 Current exercise: none Current diet habits: well balanced  Pertinent Labs: Lab Results  Component Value Date   CHOL 326 (H) 03/19/2019   HDL 67 03/19/2019   LDLCALC 238 (H) 03/19/2019   TRIG 121 03/19/2019   CHOLHDL 4.9 (H) 03/19/2019   Lab Results  Component Value Date   NA 137 09/21/2019   K 4.9 09/21/2019   CREATININE 1.52 (H) 09/21/2019   GFRNONAA 33 (L) 09/21/2019   GFRAA 38 (L) 09/21/2019   GLUCOSE 100 (H) 09/21/2019      Hypertension, follow-up  BP Readings from Last 3 Encounters:  09/21/19 (!) 151/95  06/21/19 (!) 168/92  06/18/19 (!) 151/98   Wt Readings from Last 3 Encounters:  09/21/19 196 lb 3.2 oz (89 kg)  06/21/19 194 lb 6.4 oz (88.2 kg)  06/18/19 195 lb 9.6 oz (88.7 kg)     She was last seen for hypertension 3 months ago.  BP at that visit was 168/92. Management since that visit includes no changes.  She reports excellent compliance  with treatment. She is not having side effects.  She is following a Regular diet. She is not exercising. She does not smoke.  Use of agents associated with hypertension: none.   Outside blood pressures are 130/70's-80's. Symptoms: No chest pain No chest pressure  No palpitations No syncope  No dyspnea No orthopnea  No paroxysmal nocturnal dyspnea No lower extremity edema    The ASCVD Risk score ([place] et al., 2013) failed to calculate for the following reasons:   The valid total cholesterol range is 130 to 320 mg/dL    Patient Active Problem List   Diagnosis Date Noted  . Epigastric hernia 06/21/2019  . Diastasis recti 06/21/2019  . Class 2 obesity with body mass index (BMI) of 35.0 to 35.9 in adult 06/21/2019  . Bilateral inguinal hernia 06/21/2019  . Umbilical hernia without obstruction and without gangrene 06/21/2019  . CKD stage 3 due to type 2 diabetes mellitus ([place]) 06/18/2019  . Low back pain 07/11/2015  . Acute blood loss anemia 01/25/2015  . Chronic kidney disease (CKD), stage III (moderate) 07/31/2014  . Dysfunction of eustachian tube 05/30/2014  . Acid reflux 05/30/2014  . Burning or prickling sensation 05/30/2014  . Deficiency of vitamin B 07/05/2008  . Anemia, deficiency 06/26/2008  . Avitaminosis D 06/26/2008  .  Benign hypertension with CKD (chronic kidney disease) stage III 06/15/2008  . Hypercholesteremia 06/12/2008  . Diabetes mellitus without complication ([place]) 18/84/1660  . Colon, diverticulosis 06/06/2008  . Apnea, sleep 01/26/2007  . History of colon polyps 08/09/2006   Past Medical History:  Diagnosis Date  . Diabetes mellitus without complication ([place])   . Hypercholesterolemia   . Hypertension        Medications: Outpatient Medications Prior to Visit  Medication Sig  . albuterol (VENTOLIN HFA) 108 (90 Base) MCG/ACT inhaler Inhale 2 puffs into the lungs every 6 (six) hours as needed for wheezing or shortness of breath.  Marland Kitchen amLODipine  (NORVASC) 5 MG tablet Take 1 tablet (5 mg total) by mouth daily.  . benazepril (LOTENSIN) 40 MG tablet Take 1 tablet (40 mg total) by mouth 2 (two) times daily.  . Blood Glucose Monitoring Suppl (ONE TOUCH ULTRA SYSTEM KIT) w/Device KIT [place] blood sugar once per day  . cholecalciferol (VITAMIN D) 1000 UNITS tablet Take 1,000 Units by mouth daily. Pt taking 2000 daily  . ferrous sulfate 325 (65 FE) MG tablet TAKE 1 TABLET BY MOUTH EVERY DAY  . glucose blood test strip Check blood sugar once daily  . Lancets (ONETOUCH ULTRASOFT) lancets Check blood sugar once daily  . metFORMIN (GLUCOPHAGE) 1000 MG tablet Take 1 tablet (1,000 mg total) by mouth daily with breakfast.  . metoprolol succinate (TOPROL-XL) 50 MG 24 hr tablet Take 1 tablet (50 mg total) by mouth daily. Take with or immediately following a meal.  . omeprazole (PRILOSEC) 40 MG capsule Take 1 capsule (40 mg total) by mouth daily.  . rosuvastatin (CRESTOR) 5 MG tablet Take 1 tablet (5 mg total) by mouth daily.  Marland Kitchen torsemide (DEMADEX) 10 MG tablet Take 1 tablet (10 mg total) by mouth every morning.   No facility-administered medications prior to visit.    Review of Systems  Constitutional: Negative.   Respiratory: Negative.   Cardiovascular: Negative.   Neurological: Negative.     Last CBC Lab Results  Component Value Date   WBC 4.9 06/18/2019   HGB 11.3 06/18/2019   HCT 34.3 06/18/2019   MCV 87 06/18/2019   MCH 28.6 06/18/2019   RDW 12.7 06/18/2019   PLT 213 63/01/6008   Last metabolic panel Lab Results  Component Value Date   GLUCOSE 100 (H) 09/21/2019   NA 137 09/21/2019   K 4.9 09/21/2019   CL 99 09/21/2019   CO2 24 09/21/2019   BUN 29 (H) 09/21/2019   CREATININE 1.52 (H) 09/21/2019   GFRNONAA 33 (L) 09/21/2019   GFRAA 38 (L) 09/21/2019   CALCIUM 9.6 09/21/2019   PHOS 3.8 09/21/2019   PROT 7.0 03/19/2019   ALBUMIN 4.1 09/21/2019   LABGLOB 2.8 03/19/2019   AGRATIO 1.5 03/19/2019   BILITOT 0.2 03/19/2019    ALKPHOS 64 03/19/2019   AST 14 03/19/2019   ALT 12 03/19/2019   ANIONGAP 10 04/01/2018      Objective    BP (!) 151/95 (BP Location: Left Arm, Patient Position: Sitting, Cuff Size: Large)   Pulse 71   Temp 98.8 F (37.1 C) (Oral)   Resp 16   Wt 196 lb 3.2 oz (89 kg)   BMI 35.89 kg/m  BP Readings from Last 3 Encounters:  09/21/19 (!) 151/95  06/21/19 (!) 168/92  06/18/19 (!) 151/98   Wt Readings from Last 3 Encounters:  09/21/19 196 lb 3.2 oz (89 kg)  06/21/19 194 lb 6.4 oz (88.2 kg)  06/18/19  195 lb 9.6 oz (88.7 kg)      Physical Exam Vitals reviewed.  Constitutional:      General: She is not in acute distress.    Appearance: Normal appearance. She is well-developed. She is obese. She is not ill-appearing or diaphoretic.  Cardiovascular:     Rate and Rhythm: Normal rate and regular rhythm.     Pulses: Normal pulses.     Heart sounds: Normal heart sounds. No murmur heard.  No friction rub. No gallop.   Pulmonary:     Effort: Pulmonary effort is normal. No respiratory distress.     Breath sounds: Normal breath sounds. No wheezing or rales.  Musculoskeletal:     Cervical back: Normal range of motion and neck supple.  Neurological:     Mental Status: She is alert.       Results for orders placed or performed in visit on 09/21/19  Renal Function Panel  Result Value Ref Range   Glucose 100 (H) 65 - 99 mg/dL   BUN 29 (H) 8 - 27 mg/dL   Creatinine, Ser 1.52 (H) 0.57 - 1.00 mg/dL   GFR calc non Af Amer 33 (L) >59 mL/min/1.73   GFR calc Af Amer 38 (L) >59 mL/min/1.73   BUN/Creatinine Ratio 19 12 - 28   Sodium 137 134 - 144 mmol/L   Potassium 4.9 3.5 - 5.2 mmol/L   Chloride 99 96 - 106 mmol/L   CO2 24 20 - 29 mmol/L   Calcium 9.6 8.7 - 10.3 mg/dL   Phosphorus 3.8 3.0 - 4.3 mg/dL   Albumin 4.1 3.7 - 4.7 g/dL  POCT glycosylated hemoglobin (Hb A1C)  Result Value Ref Range   Hemoglobin A1C 6.6 (A) 4.0 - 5.6 %   Est. average glucose Bld gHb Est-mCnc 143      Assessment & Plan     1. Type 2 diabetes mellitus with stage 4 chronic kidney disease, without long-term current use of insulin (HCC) A1c increased just slightly from 6.4 to now 6.6. Continue Metformin 1075m daily. Will check labs as below and f/u pending results. Followed by Nephrology. On Ace Inhibitor and statin. F/U in 3 months.  - Renal Function Panel  2. Benign hypertension with CKD (chronic kidney disease) stage III Home readings are finally improved to close to normal range and office reading is slightly lower than previous readings. Continue Benazepril 441mBID, Metoprolol XR 5043mand uses torsemide 88m45m well.  **  3. CKD stage 3 due to type 2 diabetes mellitus (HCC) Stable. Will check labs as noted above. Followed by Nephrology, Dr. SingCandiss Norse Return in about 3 months (around 12/22/2019) for t2dm.      I, JReynolds Bowl-C, have reviewed all documentation for this visit. The documentation on 09/25/19 for the exam, diagnosis, procedures, and orders are all accurate and complete.   [facility]rl[phone]) [phone])  [facility]

## 2019-09-21 ENCOUNTER — Encounter: Payer: Self-pay | Admitting: Physician Assistant

## 2019-09-21 ENCOUNTER — Ambulatory Visit (INDEPENDENT_AMBULATORY_CARE_PROVIDER_SITE_OTHER): Payer: Medicare Other | Admitting: Physician Assistant

## 2019-09-21 ENCOUNTER — Other Ambulatory Visit: Payer: Self-pay

## 2019-09-21 VITALS — BP 151/95 | HR 71 | Temp 98.8°F | Resp 16 | Wt 196.2 lb

## 2019-09-21 DIAGNOSIS — N184 Chronic kidney disease, stage 4 (severe): Secondary | ICD-10-CM | POA: Diagnosis not present

## 2019-09-21 DIAGNOSIS — I129 Hypertensive chronic kidney disease with stage 1 through stage 4 chronic kidney disease, or unspecified chronic kidney disease: Secondary | ICD-10-CM | POA: Diagnosis not present

## 2019-09-21 DIAGNOSIS — N183 Chronic kidney disease, stage 3 unspecified: Secondary | ICD-10-CM | POA: Diagnosis not present

## 2019-09-21 DIAGNOSIS — E1122 Type 2 diabetes mellitus with diabetic chronic kidney disease: Secondary | ICD-10-CM | POA: Diagnosis not present

## 2019-09-21 LAB — POCT GLYCOSYLATED HEMOGLOBIN (HGB A1C)
Est. average glucose Bld gHb Est-mCnc: 143
Hemoglobin A1C: 6.6 % — AB (ref 4.0–5.6)

## 2019-09-22 LAB — RENAL FUNCTION PANEL
Albumin: 4.1 g/dL (ref 3.7–4.7)
BUN/Creatinine Ratio: 19 (ref 12–28)
BUN: 29 mg/dL — ABNORMAL HIGH (ref 8–27)
CO2: 24 mmol/L (ref 20–29)
Calcium: 9.6 mg/dL (ref 8.7–10.3)
Chloride: 99 mmol/L (ref 96–106)
Creatinine, Ser: 1.52 mg/dL — ABNORMAL HIGH (ref 0.57–1.00)
GFR calc Af Amer: 38 mL/min/{1.73_m2} — ABNORMAL LOW (ref >59)
GFR calc non Af Amer: 33 mL/min/{1.73_m2} — ABNORMAL LOW (ref >59)
Glucose: 100 mg/dL — ABNORMAL HIGH (ref 65–99)
Phosphorus: 3.8 mg/dL (ref 3.0–4.3)
Potassium: 4.9 mmol/L (ref 3.5–5.2)
Sodium: 137 mmol/L (ref 134–144)

## 2019-10-18 ENCOUNTER — Telehealth: Payer: Self-pay | Admitting: *Deleted

## 2019-10-18 NOTE — Chronic Care Management (AMB) (Signed)
  Chronic Care Management   Note  10/18/2019 Name: Miranda Olsen MRN: [id number] DOB: 11-Dec-1940  Miranda Olsen is a 79 y.o. year old female who is a primary care patient of Rubye Beach. I reached out to Miranda Olsen by phone today in response to a referral sent by Ms. Tracina L Piechota's health plan.     Ms. Langer was given information about Chronic Care Management services today including:  1. CCM service includes personalized support from designated clinical staff supervised by her physician, including individualized plan of care and coordination with other care providers 2. 24/7 contact phone numbers for assistance for urgent and routine care needs. 3. Service will only be billed when office clinical staff spend 20 minutes or more in a month to coordinate care. 4. Only one practitioner may furnish and bill the service in a calendar month. 5. The patient may stop CCM services at any time (effective at the end of the month) by phone call to the office staff. 6. The patient will be responsible for cost sharing (co-pay) of up to 20% of the service fee (after annual deductible is met).  Patient wishes to consider information provided and/or speak with a member of the care team before deciding about enrollment in care management services.   Follow up plan: Telephone appointment with care management team member scheduled for:11/12/2019  [facility]

## 2019-11-12 ENCOUNTER — Ambulatory Visit: Payer: Medicare Other | Admitting: Pharmacist

## 2019-11-12 ENCOUNTER — Other Ambulatory Visit: Payer: Self-pay

## 2019-11-12 DIAGNOSIS — E7800 Pure hypercholesterolemia, unspecified: Secondary | ICD-10-CM

## 2019-11-12 DIAGNOSIS — E119 Type 2 diabetes mellitus without complications: Secondary | ICD-10-CM

## 2019-11-12 NOTE — Chronic Care Management (AMB) (Signed)
Chronic Care Management Pharmacy  Name: Miranda Olsen  MRN: [id number] DOB: 07-25-1940  Chief Complaint/ HPI  Miranda Olsen,  79 y.o. , female presents for their Initial CCM visit with the clinical pharmacist via telephone due to COVID-19 Pandemic.  PCP : Mar Daring, PA-C  Their chronic conditions include: DM, HTN, CKD3  Office Visits: 8/27 DM, [place], BP 151/95 P 71 Wt 196 BMI 35.9, A1c inc 6.6%, home BP readings normal 5/24 DM, [place], BP 151/98 P 116 Wt 195.5 BMI 35.8, d/c clonidine, inc benazepril $RemoveBefore'40mg'dKrGWSrqjjQFc$  bid, hernias  Consult Visit: 7/14 CKD3, Singh, BP 132/86 P 86 Wt 199.5 BMI 36.5, stop metformin if GFR < 30  Medications: Outpatient Encounter Medications as of 11/12/2019  Medication Sig  . amLODipine (NORVASC) 5 MG tablet Take 1 tablet (5 mg total) by mouth daily.  . benazepril (LOTENSIN) 40 MG tablet Take 1 tablet (40 mg total) by mouth 2 (two) times daily.  . cholecalciferol (VITAMIN D) 1000 UNITS tablet Take 1,000 Units by mouth daily. Pt taking 2000 daily  . ferrous sulfate 325 (65 FE) MG tablet TAKE 1 TABLET BY MOUTH EVERY DAY  . metFORMIN (GLUCOPHAGE) 1000 MG tablet Take 1 tablet (1,000 mg total) by mouth daily with breakfast.  . metoprolol succinate (TOPROL-XL) 50 MG 24 hr tablet Take 1 tablet (50 mg total) by mouth daily. Take with or immediately following a meal.  . omeprazole (PRILOSEC) 40 MG capsule Take 1 capsule (40 mg total) by mouth daily.  Marland Kitchen torsemide (DEMADEX) 10 MG tablet Take 1 tablet (10 mg total) by mouth every morning.  Marland Kitchen albuterol (VENTOLIN HFA) 108 (90 Base) MCG/ACT inhaler Inhale 2 puffs into the lungs every 6 (six) hours as needed for wheezing or shortness of breath. (Patient not taking: Reported on 11/12/2019)  . Blood Glucose Monitoring Suppl (ONE TOUCH ULTRA SYSTEM KIT) w/Device KIT [place] blood sugar once per day (Patient not taking: Reported on 11/12/2019)  . glucose blood test strip Check blood sugar once daily (Patient not  taking: Reported on 11/12/2019)  . Lancets (ONETOUCH ULTRASOFT) lancets Check blood sugar once daily (Patient not taking: Reported on 11/12/2019)  . rosuvastatin (CRESTOR) 5 MG tablet Take 1 tablet (5 mg total) by mouth daily. (Patient not taking: Reported on 11/12/2019)  . [DISCONTINUED] ferrous sulfate 325 (65 FE) MG tablet TAKE 1 TABLET BY MOUTH EVERY DAY   No facility-administered encounter medications on file as of 11/12/2019.     Financial Resource Strain: Low Risk   . Difficulty of Paying Living Expenses: Not hard at all    Current Diagnosis/Assessment:  Goals Addressed            This Visit's Progress   . Chronic Care Management       CARE PLAN ENTRY (see longitudinal plan of care for additional care plan information)  Current Barriers:  . Chronic Disease Management support, education, and care coordination needs related to Hypertension, Hyperlipidemia, Diabetes, and Chronic Kidney Disease   Hypertension BP Readings from Last 3 Encounters:  09/21/19 (!) 151/95  06/21/19 (!) 168/92  06/18/19 (!) 151/98   . Pharmacist Clinical Goal(s): o Over the next 90 days, patient will work with PharmD and providers to achieve BP goal <140/90 . Current regimen:  . Amlodipine $RemoveBefo'5mg'jatIgrkvzYL$  daily . Benazepril $RemoveBefo'40mg'EvKOOpPfEah$  twice daily . Toprol XL $RemoveB'50mg'yPjhORNP$  daily after food . Torsemide $RemoveBef'10mg'WyMbAxCquy$  daily . Interventions: o None . Patient self care activities - Over the next 90 days, patient will: o Check BP weekly,  document, and provide at future appointments o Ensure daily salt intake < 2300 mg/day  Hyperlipidemia Lab Results  Component Value Date/Time   LDLCALC 238 (H) 03/19/2019 10:53 AM   . Pharmacist Clinical Goal(s): o Over the next 90 days, patient will work with PharmD and providers to achieve LDL goal < 70 . Current regimen:  o Crestor 5mg  daily . Interventions: o Restart rosuvastatin 5mg  daily . Patient self care activities - Over the next 90 days, patient will: o Take rosuvastatin every  day as directed  Diabetes Lab Results  Component Value Date/Time   HGBA1C 6.6 (A) 09/21/2019 09:22 AM   HGBA1C 6.4 (A) 06/18/2019 09:10 AM   HGBA1C 6.5 (H) 03/19/2019 10:53 AM   HGBA1C 6.1 (H) 05/04/2018 09:34 AM   . Pharmacist Clinical Goal(s): o Over the next 90 days, patient will work with PharmD and providers to maintain A1c goal <7% . Current regimen:  o Metformin 1000mg  daily with breakfast . Interventions: o None . Patient self care activities - Over the next 90 days, patient will: o Check blood sugar once daily, document, and provide at future appointments o Contact provider with any episodes of hypoglycemia  Medication management . Pharmacist Clinical Goal(s): o Over the next 90 days, patient will work with PharmD and providers to achieve optimal medication adherence . Current pharmacy: [facility] . Interventions o Comprehensive medication review performed. o Continue current medication management strategy . Patient self care activities - Over the next 90 days, patient will: o Focus on medication adherence by taking Crestor every day o Take medications as prescribed o Report any questions or concerns to PharmD and/or provider(s)  Initial goal documentation       Hypertension   BP goal is:  <140/90  Office blood pressures are  BP Readings from Last 3 Encounters:  09/21/19 (!) 151/95  06/21/19 (!) 168/92  06/18/19 (!) 151/98   Patient checks BP at home daily Patient home BP readings are ranging: Normal  Patient has failed these meds in the past: clonidine Patient is currently uncontrolled on the following medications:  . Amlodipine 5mg  daily . Benazepril 40mg  bid . Toprol XL 50mg  daily . Torsemide 10mg  daily  We discussed: White coat hypertension per pt and PCP Hasn't had hypotension in year Fell, cracked heel, wearing boot Torsemide works, but ankles still big Times meds before and after  Clonidine caused chest pain. Has failed multiple agents.  Benazepril doubled due to limited options.  Plan  Continue current medications   Diabetes   Recent Relevant Labs: Lab Results  Component Value Date/Time   HGBA1C 6.6 (A) 09/21/2019 09:22 AM   HGBA1C 6.4 (A) 06/18/2019 09:10 AM   HGBA1C 6.5 (H) 03/19/2019 10:53 AM   HGBA1C 6.1 (H) 05/04/2018 09:34 AM   MICROALBUR 50 08/31/2016 09:04 AM    Kidney Function Lab Results  Component Value Date/Time   CREATININE 1.52 (H) 09/21/2019 09:48 AM   CREATININE 1.60 (H) 06/18/2019 09:51 AM   GFRNONAA 33 (L) 09/21/2019 09:48 AM   GFRAA 38 (L) 09/21/2019 09:48 AM   K 4.9 09/21/2019 09:48 AM   K 4.4 06/18/2019 09:51 AM    Checking BG: Rarely  Patient has failed these meds in past: NA Patient is currently controlled on the following medications: metformin 1000mg  ac breakfast  Last diabetic Foot exam:  Lab Results  Component Value Date/Time   HMDIABEYEEXA No Retinopathy 09/10/2019 12:00 AM    Last diabetic Eye exam: No results found for: HMDIABFOOTEX   We  discussed:  Doesn't take FSBG At goal, A1c stable Denies hypoglycemia Denies metformin GI distress  Plan  Continue current medications  Hyperlipidemia   LDL goal < 70  Lipid Panel     Component Value Date/Time   CHOL 326 (H) 03/19/2019 1053   TRIG 121 03/19/2019 1053   HDL 67 03/19/2019 1053   LDLCALC 238 (H) 03/19/2019 1053    Hepatic Function Latest Ref Rng & Units 09/21/2019 06/18/2019 03/19/2019  Total Protein 6.0 - 8.5 g/dL - - 7.0  Albumin 3.7 - 4.7 g/dL 4.1 4.4 4.2  AST 0 - 40 IU/L - - 14  ALT 0 - 32 IU/L - - 12  Alk Phosphatase 39 - 117 IU/L - - 64  Total Bilirubin 0.0 - 1.2 mg/dL - - 0.2     The ASCVD Risk score ([place]., et al., 2013) failed to calculate for the following reasons:   The valid total cholesterol range is 130 to 320 mg/dL   Patient has failed these meds in past: NA Patient is currently uncontrolled on the following medications:  . Crestor $RemoveB'5mg'ksjFZlBc$  daily  We discussed:  Stopped taking  Crestor for a while. Will start taking every day. Lab work does not reflect current regimen. Scheduled repeat next month.  Plan  Restart Crestor $RemoveBeforeD'5mg'EGqvUwbHuBqHUE$  daily  Medication Management   [facility] Uses pill box? Yes Pt endorses 90% compliance  We discussed:  Mourning recent death of son, not in good taste to push pharmacy change at this time. Doesn't need rescue inhaler since drinking more water Buried son last week  Maybe doesn't need iron much longer, but pt states Hgb goes down when not on it  Plan  Continue current medication management strategy  Follow up: 3 month phone visit, CPA adherence 1 month, BP 2 months  Milus Height, PharmD, [place], [place] [phone]

## 2019-11-13 NOTE — Patient Instructions (Addendum)
Visit Information  Goals Addressed            This Visit's Progress   . Chronic Care Management       CARE PLAN ENTRY (see longitudinal plan of care for additional care plan information)  Current Barriers:  . Chronic Disease Management support, education, and care coordination needs related to Hypertension, Hyperlipidemia, Diabetes, and Chronic Kidney Disease   Hypertension BP Readings from Last 3 Encounters:  09/21/19 (!) 151/95  06/21/19 (!) 168/92  06/18/19 (!) 151/98   . Pharmacist Clinical Goal(s): o Over the next 90 days, patient will work with PharmD and providers to achieve BP goal <140/90 . Current regimen:  . Amlodipine 5mg  daily . Benazepril 40mg  twice daily . Toprol XL 50mg  daily after food . Torsemide 10mg  daily . Interventions: o None . Patient self care activities - Over the next 90 days, patient will: o Check BP weekly, document, and provide at future appointments o Ensure daily salt intake < 2300 mg/day  Hyperlipidemia Lab Results  Component Value Date/Time   LDLCALC 238 (H) 03/19/2019 10:53 AM   . Pharmacist Clinical Goal(s): o Over the next 90 days, patient will work with PharmD and providers to achieve LDL goal < 70 . Current regimen:  o Crestor 5mg  daily . Interventions: o Restart rosuvastatin 5mg  daily . Patient self care activities - Over the next 90 days, patient will: o Take rosuvastatin every day as directed  Diabetes Lab Results  Component Value Date/Time   HGBA1C 6.6 (A) 09/21/2019 09:22 AM   HGBA1C 6.4 (A) 06/18/2019 09:10 AM   HGBA1C 6.5 (H) 03/19/2019 10:53 AM   HGBA1C 6.1 (H) 05/04/2018 09:34 AM   . Pharmacist Clinical Goal(s): o Over the next 90 days, patient will work with PharmD and providers to maintain A1c goal <7% . Current regimen:  o Metformin 1000mg  daily with breakfast . Interventions: o None . Patient self care activities - Over the next 90 days, patient will: o Check blood sugar once daily, document, and  provide at future appointments o Contact provider with any episodes of hypoglycemia  Medication management . Pharmacist Clinical Goal(s): o Over the next 90 days, patient will work with PharmD and providers to achieve optimal medication adherence . Current pharmacy: [facility] . Interventions o Comprehensive medication review performed. o Continue current medication management strategy . Patient self care activities - Over the next 90 days, patient will: o Focus on medication adherence by taking Crestor every day o Take medications as prescribed o Report any questions or concerns to PharmD and/or provider(s)  Initial goal documentation        Ms. Petz was given information about Chronic Care Management services today including:  1. CCM service includes personalized support from designated clinical staff supervised by her physician, including individualized plan of care and coordination with other care providers 2. 24/7 contact phone numbers for assistance for urgent and routine care needs. 3. Standard insurance, coinsurance, copays and deductibles apply for chronic care management only during months in which we provide at least 20 minutes of these services. Most insurances cover these services at 100%, however patients may be responsible for any copay, coinsurance and/or deductible if applicable. This service may help you avoid the need for more expensive face-to-face services. 4. Only one practitioner may furnish and bill the service in a calendar month. 5. The patient may stop CCM services at any time (effective at the end of the month) by phone call to the office staff.  Patient  agreed to services and verbal consent obtained.   Print copy of patient instructions provided.  Telephone follow up appointment with pharmacy team member scheduled for: 3 months  Milus Height, PharmD, BCGP, [place] [phone]   Familial  Hypercholesterolemia Familial hypercholesterolemia (FH) is a genetic disorder that causes a very high level of LDL (low-density lipoprotein) cholesterol. Cholesterol is a waxy, fat-like substance that your body needs to build cells. Your body makes all the cholesterol it needs in the liver and removes extra (excess) cholesterol from the blood as needed. Excess cholesterol comes from food that you eat. In people who have FH, the body is not able to remove LDL cholesterol from the blood as it should. A high level of LDL cholesterol puts you at higher risk for narrowing and hardening of your arteries (atherosclerosis) at an early age. This raises your risk for heart disease and stroke. What are the causes? FH is passed from parent to child (inherited). FH is caused by an inherited gene defect (genetic mutation) that makes it hard for the liver to remove LDL cholesterol from the blood. The gene may be inherited from one parent or both parents. What increases the risk? You may be at higher risk for FH if:  You have a family history of the condition. If both parents carry the genetic mutation, their children are at higher risk for a more severe form of FH, with symptoms that start at an earlier age. What are the signs or symptoms? You may have a high level of LDL cholesterol before you develop symptoms. Symptoms of FH may include:  Cholesterol nodules (xanthomas) on the cords of tissue that connect muscles to bones (tendons). Xanthomas often form on the long tendon at the back of the ankle (Achilles tendon) or on the tendons on the back of the hands.  Cholesterol deposits (xanthelasmas) under the skin of the eyelids.  A gray or blue ring around the white part of the eye (corneal arcus). Complications of FH can occur due to atherosclerosis. Atherosclerosis may cause damage to an area of the body that is not getting enough blood. Complications of FH may include:  Chest pain (angina) and shortness of  breath due to narrowed or blocked arteries in the heart (coronary artery disease).  Pain and cramping in the back of the lower legs (calves) when walking (claudication).  Interruption in blood flow to the brain (stroke). This may cause: ? Loss of balance. ? Vision loss. ? Sudden weakness or numbness on one side of the body. How is this diagnosed? This condition may be diagnosed based on:  Your symptoms.  Your medical history, including any family history of FH or early coronary heart disease.  A physical exam.  A blood test to check for the genetic mutation that causes FH. Your family members may also be tested. How is this treated? There is no cure for FH, but treatment can lower LDL cholesterol levels and lower your risk for heart attack or stroke. Treatment should be started as soon as you are diagnosed. Treatment may include:  A type of medicine that lowers your cholesterol (statin). If statins do not help, your health care provider may try other kinds of cholesterol-lowering medicines. The exact combination of medicines depends on the severity of your symptoms.  A procedure to filter LDL from your blood (apheresis). You may need this treatment if you have a severe form of FH.  Making lifestyle changes that are healthy for your  heart, such as lowering the amount of fat and cholesterol in your diet. Follow these instructions at home: Lifestyle   Lose weight, if directed by your health care provider.  Follow instructions from your health care provider about eating a healthy diet. Your health care provider may recommend: ? Working with a diet and nutrition specialist (dietitian), who can help you make a healthy eating plan and help you maintain a healthy weight. ? Eating less fat and cholesterol. Avoid fatty meats, fried foods, and whole-fat dairy. ? Eating more vegetables, fruits, and whole grains. ? Limiting your intake of alcohol.  Be physically active. Ask your health care  provider what type of exercise is best for you.  Do not use any products that contain nicotine or tobacco, such as cigarettes and e-cigarettes. If you need help quitting, ask your health care provider.  Work with your health care provider to manage any other conditions you have, such as high blood pressure (hypertension) or diabetes. These conditions affect your heart. General instructions  Take over-the-counter and prescription medicines only as told by your health care provider.  Keep all follow-up visits as told by your health care provider. This is important. Contact a health care provider if:  You have pain or cramps in your calf when you walk. Get help right away if:   You have sudden, unexplained discomfort in your chest, arms, back, neck, jaw, or upper body.  You have trouble breathing.  You have a sudden, severe headache with no known cause.  You have any symptoms of a stroke. "BE FAST" is an easy way to remember the main warning signs of a stroke: ? B - Balance. Signs are dizziness, sudden trouble walking, or loss of balance. ? E - Eyes. Signs are trouble seeing or a sudden change in vision. ? F - Face. Signs are sudden weakness or numbness of the face, or the face or eyelid drooping on one side. ? A - Arms. Signs are weakness or numbness in an arm. This happens suddenly and usually on one side of the body. ? S - Speech. Signs are sudden trouble speaking, slurred speech, or trouble understanding what people say. ? T - Time. Time to call emergency services. Write down what time symptoms started. These symptoms may represent a serious problem that is an emergency. Do not wait to see if the symptoms will go away. Get medical help right away. Call your local emergency services (911 in the [place] Do not drive yourself to the hospital. Summary  Familial hypercholesterolemia (FH) is a genetic disorder that causes a very high level of LDL (low-density lipoprotein) cholesterol.  FH  increases your risk for coronary heart disease and stroke at an early age.  Treatment for FH should be started as soon as you are diagnosed with the condition. Treatment is aimed at lowering your risk for complications.  Follow instructions from your health care provider about eating a healthy diet. Your health care provider may recommend eating less fat and cholesterol. This information is not intended to replace advice given to you by your health care provider. Make sure you discuss any questions you have with your health care provider. Document Revised: 12/24/2016 Document Reviewed: 06/24/2016 Elsevier Patient Education  [place].

## 2019-12-24 ENCOUNTER — Ambulatory Visit: Payer: Self-pay | Admitting: Physician Assistant

## 2020-01-21 ENCOUNTER — Other Ambulatory Visit: Payer: Self-pay | Admitting: Physician Assistant

## 2020-01-21 DIAGNOSIS — N183 Chronic kidney disease, stage 3 unspecified: Secondary | ICD-10-CM

## 2020-02-04 ENCOUNTER — Ambulatory Visit: Payer: Self-pay | Admitting: Physician Assistant

## 2020-02-06 ENCOUNTER — Telehealth: Payer: Self-pay

## 2020-02-06 NOTE — Chronic Care Management (AMB) (Signed)
Chronic Care Management Pharmacy Assistant   Name: Miranda Olsen  MRN: [id number] DOB: March 03, 1940  Reason for Encounter: Disease State - General Adherence Call.  Patient Questions:   1.  Have you seen any other providers since your last visit? Yes. 11/26/2019 Murlean Iba, Nephrology     2.  Any changes in your medicines or health? No     PCP : Mar Daring, PA-C  Allergies:   Allergies  Allergen Reactions  . Clonidine Derivatives Other (See Comments)    Medications: Outpatient Encounter Medications as of 02/06/2020  Medication Sig  . albuterol (VENTOLIN HFA) 108 (90 Base) MCG/ACT inhaler Inhale 2 puffs into the lungs every 6 (six) hours as needed for wheezing or shortness of breath. (Patient not taking: Reported on 11/12/2019)  . amLODipine (NORVASC) 5 MG tablet TAKE 1 TABLET BY MOUTH EVERY DAY  . benazepril (LOTENSIN) 40 MG tablet TAKE 1 TABLET BY MOUTH TWICE A DAY  . Blood Glucose Monitoring Suppl (ONE TOUCH ULTRA SYSTEM KIT) w/Device KIT [place] blood sugar once per day (Patient not taking: Reported on 11/12/2019)  . cholecalciferol (VITAMIN D) 1000 UNITS tablet Take 1,000 Units by mouth daily. Pt taking 2000 daily  . ferrous sulfate 325 (65 FE) MG tablet TAKE 1 TABLET BY MOUTH EVERY DAY  . glucose blood test strip Check blood sugar once daily (Patient not taking: Reported on 11/12/2019)  . Lancets (ONETOUCH ULTRASOFT) lancets Check blood sugar once daily (Patient not taking: Reported on 11/12/2019)  . metFORMIN (GLUCOPHAGE) 1000 MG tablet Take 1 tablet (1,000 mg total) by mouth daily with breakfast.  . metoprolol succinate (TOPROL-XL) 50 MG 24 hr tablet Take 1 tablet (50 mg total) by mouth daily. Take with or immediately following a meal.  . omeprazole (PRILOSEC) 40 MG capsule Take 1 capsule (40 mg total) by mouth daily.  . rosuvastatin (CRESTOR) 5 MG tablet Take 1 tablet (5 mg total) by mouth daily. (Patient not taking: Reported on 11/12/2019)  . torsemide  (DEMADEX) 10 MG tablet Take 1 tablet (10 mg total) by mouth every morning.   No facility-administered encounter medications on file as of 02/06/2020.    Current Diagnosis: Patient Active Problem List   Diagnosis Date Noted  . Epigastric hernia 06/21/2019  . Diastasis recti 06/21/2019  . Class 2 obesity with body mass index (BMI) of 35.0 to 35.9 in adult 06/21/2019  . Bilateral inguinal hernia 06/21/2019  . Umbilical hernia without obstruction and without gangrene 06/21/2019  . CKD stage 3 due to type 2 diabetes mellitus ([place]) 06/18/2019  . Low back pain 07/11/2015  . Acute blood loss anemia 01/25/2015  . Chronic kidney disease (CKD), stage III (moderate) ([place]) 07/31/2014  . Dysfunction of eustachian tube 05/30/2014  . Acid reflux 05/30/2014  . Burning or prickling sensation 05/30/2014  . Deficiency of vitamin B 07/05/2008  . Anemia, deficiency 06/26/2008  . Avitaminosis D 06/26/2008  . Benign hypertension with CKD (chronic kidney disease) stage III ([place]) 06/15/2008  . Hypercholesteremia 06/12/2008  . Diabetes mellitus without complication ([place]) 19/50/9326  . Colon, diverticulosis 06/06/2008  . Apnea, sleep 01/26/2007  . History of colon polyps 08/09/2006    Follow-Up:  Pharmacist Review Called patient for general adherence call. Per patient she is taking her medications as directed except for her Rosuvastatin, she states she gets leg cramps when taking every day so she has been taking a few days a weeks.   Patient states she has not been taking her blood sugars at  home states she has a machine, but she does not know how to work it, I told patient to bring with her at next appointment so someone could help her, she said her PCP office has told her to bring in but she keeps forgetting to.  Patient states she has been active and she has been eating healthy. Patient states she has been trying to stay busy so she don't have a lot of time to think,states her son pass away 10/10/201, she  states she has been very sad. I told patient if she would like to schedule an earlier  appointment with Fenton Malling, I would let them know,she is okay with her 02/20/2020 appointment. Patient has appointment with pharmacist. on 02/15/2020.  Cathe Mons Fleury,CPP Notified  Judithann Sheen, [facility] Clinical Pharmacist Assistant [phone]

## 2020-02-13 ENCOUNTER — Other Ambulatory Visit: Payer: Self-pay | Admitting: Physician Assistant

## 2020-02-13 DIAGNOSIS — K219 Gastro-esophageal reflux disease without esophagitis: Secondary | ICD-10-CM

## 2020-02-13 DIAGNOSIS — N183 Chronic kidney disease, stage 3 unspecified: Secondary | ICD-10-CM

## 2020-02-13 DIAGNOSIS — I129 Hypertensive chronic kidney disease with stage 1 through stage 4 chronic kidney disease, or unspecified chronic kidney disease: Secondary | ICD-10-CM

## 2020-02-14 ENCOUNTER — Telehealth: Payer: Self-pay

## 2020-02-14 NOTE — Progress Notes (Signed)
Spoke to patient to confirmed patient telephone appointment on 02/15/2020 for CCM at 1:00 pm with [name] the Clinical pharmacist.   Patient states  she would like to cancel because she has a funeral to go to , she said she will reschedule it when she see her pcp on the 02/20/2020. Notified Clinical Pharmacist.  Bessie Lyons Pharmacist Assistant [phone]

## 2020-02-15 ENCOUNTER — Telehealth: Payer: Self-pay

## 2020-02-20 ENCOUNTER — Other Ambulatory Visit: Payer: Self-pay

## 2020-02-20 ENCOUNTER — Ambulatory Visit (INDEPENDENT_AMBULATORY_CARE_PROVIDER_SITE_OTHER): Payer: Medicare Other | Admitting: Physician Assistant

## 2020-02-20 ENCOUNTER — Encounter: Payer: Self-pay | Admitting: Physician Assistant

## 2020-02-20 VITALS — BP 174/95 | HR 74 | Temp 98.9°F | Resp 16 | Wt 184.0 lb

## 2020-02-20 DIAGNOSIS — I10 Essential (primary) hypertension: Secondary | ICD-10-CM | POA: Diagnosis not present

## 2020-02-20 DIAGNOSIS — N184 Chronic kidney disease, stage 4 (severe): Secondary | ICD-10-CM

## 2020-02-20 DIAGNOSIS — E1122 Type 2 diabetes mellitus with diabetic chronic kidney disease: Secondary | ICD-10-CM

## 2020-02-20 LAB — POCT GLYCOSYLATED HEMOGLOBIN (HGB A1C): Hemoglobin A1C: 6 % — AB (ref 4.0–5.6)

## 2020-02-20 MED ORDER — AMLODIPINE BESYLATE 5 MG PO TABS: 5.0000 mg | ORAL_TABLET | Freq: Every day | ORAL | 3 refills | Status: DC

## 2020-02-20 MED ORDER — LOSARTAN POTASSIUM 100 MG PO TABS: 100.0000 mg | ORAL_TABLET | Freq: Every day | ORAL | 0 refills | Status: DC

## 2020-02-20 MED ORDER — AMLODIPINE BESY-BENAZEPRIL HCL 5-40 MG PO CAPS: 1.0000 | ORAL_CAPSULE | Freq: Every day | ORAL | 0 refills | Status: DC

## 2020-02-20 MED ORDER — METFORMIN HCL 500 MG PO TABS: 500.0000 mg | ORAL_TABLET | Freq: Every day | ORAL | 3 refills | Status: DC

## 2020-02-20 NOTE — Patient Instructions (Addendum)
Stop Benazapril Start Losartan Change Metformin from '1000mg'$  daily to '500mg'$  daily  Losartan Tablets What is this medicine? LOSARTAN (loe SAR tan) is an angiotensin II receptor blocker, also known as an ARB. It treats high blood pressure. It can slow kidney damage in some patients. It may also be used to lower the risk of stroke. This medicine may be used for other purposes; ask your health care provider or pharmacist if you have questions. COMMON BRAND NAME(S): Cozaar What should I tell my health care provider before I take this medicine? They need to know if you have any of these conditions:  heart failure  kidney disease  liver disease  an unusual or allergic reaction to losartan, other medicines, foods, dyes, or preservatives  pregnant or trying to get pregnant  breast-feeding How should I use this medicine? Take this medicine by mouth. Take it as directed on the prescription label at the same time every day. You can take it with or without food. If it upsets your stomach, take it with food. Keep taking it unless your health care provider tells you to stop. Talk to your health care provider about the use of this medicine in children. While it may be prescribed for children as young as 6 for selected conditions, precautions do apply. Overdosage: If you think you have taken too much of this medicine contact a poison control center or emergency room at once. NOTE: This medicine is only for you. Do not share this medicine with others. What if I miss a dose? If you miss a dose, take it as soon as you can. If it is almost time for your next dose, take only that dose. Do not take double or extra doses. What may interact with this medicine?  aliskiren  ACE inhibitors, like enalapril or lisinopril  diuretics, especially amiloride, eplerenone, spironolactone, or triamterene  lithium  NSAIDs, medicines for pain and inflammation, like ibuprofen or naproxen  potassium salts or  potassium supplements This list may not describe all possible interactions. Give your health care provider a list of all the medicines, herbs, non-prescription drugs, or dietary supplements you use. Also tell them if you smoke, drink alcohol, or use illegal drugs. Some items may interact with your medicine. What should I watch for while using this medicine? Visit your health care provider for regular check ups. Check your blood pressure as directed. Ask your health care provider what your blood pressure should be. Also, find out when you should contact him or her. Do not treat yourself for coughs, colds, or pain while you are using this medicine without asking your health care provider for advice. Some medicines may increase your blood pressure. Women should inform their health care provider if they wish to become pregnant or think they might be pregnant. There is a potential for serious side effects to an unborn child. Talk to your health care provider for more information. You may get drowsy or dizzy. Do not drive, use machinery, or do anything that needs mental alertness until you know how this medicine affects you. Do not stand or sit up quickly, especially if you are an older patient. This reduces the risk of dizzy or fainting spells. Alcohol can make you more drowsy and dizzy. Avoid alcoholic drinks. Avoid salt substitutes unless you are told otherwise by your health care provider. What side effects may I notice from receiving this medicine? Side effects that you should report to your doctor or health care professional as soon as possible:  allergic reactions (skin rash, itching or hives, swelling of the hands, feet, face, lips, throat, or tongue)  breathing problems  high potassium levels (chest pain; or fast, irregular heartbeat; muscle weakness)  kidney injury (trouble passing urine or change in the amount of urine)  low blood pressure (dizziness; feeling faint or lightheaded, falls;  unusually weak or tired) Side effects that usually do not require medical attention (report to your doctor or health care professional if they continue or are bothersome):  cough  headache  nasal congestion or stuffiness  nausea or stomach pain This list may not describe all possible side effects. Call your doctor for medical advice about side effects. You may report side effects to FDA at 1-800-FDA-1088. Where should I keep my medicine? Keep out of the reach of children and pets. Store at room temperature between 20 and 25 degrees C (68 and 77 degrees F). Protect from light. Keep the container tightly closed. Get rid of any unused medicine after the expiration date. To get rid of medicines that are no longer needed or have expired:  Take the medicine to a medicine take-back program. Check with your pharmacy or law enforcement to find a location.  If you cannot return the medicine, check the label or package insert to see if the medicine should be thrown out in the garbage or flushed down the toilet. If you are not sure, ask your health care provider. If it is safe to put in the trash, empty the medicine out of the container. Mix the medicine with cat litter, dirt, coffee grounds, or other unwanted substance. Seal the mixture in a bag or container. Put it in the trash. NOTE: This sheet is a summary. It may not cover all possible information. If you have questions about this medicine, talk to your doctor, pharmacist, or health care provider.  2021 Elsevier/Gold Standard (2019-03-22 16:16:09)

## 2020-02-20 NOTE — Progress Notes (Signed)
Established patient visit   Patient: Miranda Olsen   DOB: 1940/05/18   80 y.o. Female  MRN: [id number] Visit Date: 02/20/2020  Today's healthcare provider: Mar Daring, PA-C   Chief Complaint  Patient presents with  . Diabetes  . Hypertension   Subjective    HPI  Diabetes Mellitus Type II, Follow-up  Lab Results  Component Value Date   HGBA1C 6.0 (A) 02/20/2020   HGBA1C 6.6 (A) 09/21/2019   HGBA1C 6.4 (A) 06/18/2019   Wt Readings from Last 3 Encounters:  02/20/20 184 lb (83.5 kg)  09/21/19 196 lb 3.2 oz (89 kg)  06/21/19 194 lb 6.4 oz (88.2 kg)   Last seen for diabetes 5 months ago.  Management since then includes no medication changes. She reports good compliance with treatment. She is not having side effects.  Symptoms: No fatigue No foot ulcerations  No appetite changes No nausea  No paresthesia of the feet  No polydipsia  No polyuria No visual disturbances   No vomiting     Home blood sugar records: not being checked  Episodes of hypoglycemia? No    Current insulin regiment: none Most Recent Eye Exam: 08/2019 Current exercise: none Current diet habits: well balanced  Pertinent Labs: Lab Results  Component Value Date   CHOL 326 (H) 03/19/2019   HDL 67 03/19/2019   LDLCALC 238 (H) 03/19/2019   TRIG 121 03/19/2019   CHOLHDL 4.9 (H) 03/19/2019   Lab Results  Component Value Date   NA 137 09/21/2019   K 4.9 09/21/2019   CREATININE 1.52 (H) 09/21/2019   GFRNONAA 33 (L) 09/21/2019   GFRAA 38 (L) 09/21/2019   GLUCOSE 100 (H) 09/21/2019      Hypertension, follow-up  BP Readings from Last 3 Encounters:  02/20/20 (!) 174/95  09/21/19 (!) 151/95  06/21/19 (!) 168/92   Wt Readings from Last 3 Encounters:  02/20/20 184 lb (83.5 kg)  09/21/19 196 lb 3.2 oz (89 kg)  06/21/19 194 lb 6.4 oz (88.2 kg)     She was last seen for hypertension 5 months ago.  BP at that visit was 151/95. Management since that visit includes no medication  changes.  She reports good compliance with treatment. She is not having side effects.  She is following a Regular diet. She is not exercising. She does not smoke.  Use of agents associated with hypertension: none.    Outside blood pressures are checked occasionally. Patient reports that her BP averages in the 150s/90s. Symptoms: No chest pain No chest pressure  No palpitations No syncope  No dyspnea No orthopnea  No paroxysmal nocturnal dyspnea No lower extremity edema   Pertinent labs: Lab Results  Component Value Date   CHOL 326 (H) 03/19/2019   HDL 67 03/19/2019   LDLCALC 238 (H) 03/19/2019   TRIG 121 03/19/2019   CHOLHDL 4.9 (H) 03/19/2019   Lab Results  Component Value Date   NA 137 09/21/2019   K 4.9 09/21/2019   CREATININE 1.52 (H) 09/21/2019   GFRNONAA 33 (L) 09/21/2019   GFRAA 38 (L) 09/21/2019   GLUCOSE 100 (H) 09/21/2019     The ASCVD Risk score Mikey Bussing DC Jr., et al., 2013) failed to calculate for the following reasons:   The valid total cholesterol range is 130 to 320 mg/dL   Patient Active Problem List   Diagnosis Date Noted  . Epigastric hernia 06/21/2019  . Diastasis recti 06/21/2019  . Class 2 obesity with body  mass index (BMI) of 35.0 to 35.9 in adult 06/21/2019  . Bilateral inguinal hernia 06/21/2019  . Umbilical hernia without obstruction and without gangrene 06/21/2019  . CKD stage 3 due to type 2 diabetes mellitus ([place]) 06/18/2019  . Low back pain 07/11/2015  . Acute blood loss anemia 01/25/2015  . Chronic kidney disease (CKD), stage III (moderate) ([place]) 07/31/2014  . Dysfunction of eustachian tube 05/30/2014  . Acid reflux 05/30/2014  . Burning or prickling sensation 05/30/2014  . Deficiency of vitamin B 07/05/2008  . Anemia, deficiency 06/26/2008  . Avitaminosis D 06/26/2008  . Benign hypertension with CKD (chronic kidney disease) stage III ([place]) 06/15/2008  . Hypercholesteremia 06/12/2008  . Diabetes mellitus without complication ([place])  85/02/7739  . Colon, diverticulosis 06/06/2008  . Apnea, sleep 01/26/2007  . History of colon polyps 08/09/2006   Past Medical History:  Diagnosis Date  . Diabetes mellitus without complication ([place])   . Hypercholesterolemia   . Hypertension        Medications: Outpatient Medications Prior to Visit  Medication Sig  . albuterol (VENTOLIN HFA) 108 (90 Base) MCG/ACT inhaler Inhale 2 puffs into the lungs every 6 (six) hours as needed for wheezing or shortness of breath.  Marland Kitchen amLODipine (NORVASC) 5 MG tablet TAKE 1 TABLET BY MOUTH EVERY DAY  . benazepril (LOTENSIN) 40 MG tablet TAKE 1 TABLET BY MOUTH TWICE A DAY  . cholecalciferol (VITAMIN D) 1000 UNITS tablet Take 1,000 Units by mouth daily. Pt taking 2000 daily  . ferrous sulfate 325 (65 FE) MG tablet TAKE 1 TABLET BY MOUTH EVERY DAY  . metFORMIN (GLUCOPHAGE) 1000 MG tablet Take 1 tablet (1,000 mg total) by mouth daily with breakfast.  . metoprolol succinate (TOPROL-XL) 50 MG 24 hr tablet TAKE 1 TABLET (50 MG TOTAL) BY MOUTH DAILY. TAKE WITH OR IMMEDIATELY FOLLOWING A MEAL.  Marland Kitchen omeprazole (PRILOSEC) 40 MG capsule TAKE 1 CAPSULE BY MOUTH EVERY DAY  . torsemide (DEMADEX) 10 MG tablet Take 1 tablet (10 mg total) by mouth every morning.  . Blood Glucose Monitoring Suppl (ONE TOUCH ULTRA SYSTEM KIT) w/Device KIT [place] blood sugar once per day (Patient not taking: No sig reported)  . glucose blood test strip Check blood sugar once daily (Patient not taking: No sig reported)  . Lancets (ONETOUCH ULTRASOFT) lancets Check blood sugar once daily (Patient not taking: No sig reported)  . rosuvastatin (CRESTOR) 5 MG tablet Take 1 tablet (5 mg total) by mouth daily. (Patient not taking: No sig reported)   No facility-administered medications prior to visit.    Review of Systems  Constitutional: Negative.   Respiratory: Negative.   Cardiovascular: Negative.   Gastrointestinal: Negative.   Musculoskeletal: Negative.   Neurological: Negative.      Last CBC Lab Results  Component Value Date   WBC 4.9 06/18/2019   HGB 11.3 06/18/2019   HCT 34.3 06/18/2019   MCV 87 06/18/2019   MCH 28.6 06/18/2019   RDW 12.7 06/18/2019   PLT 213 28/78/6767   Last metabolic panel Lab Results  Component Value Date   GLUCOSE 100 (H) 09/21/2019   NA 137 09/21/2019   K 4.9 09/21/2019   CL 99 09/21/2019   CO2 24 09/21/2019   BUN 29 (H) 09/21/2019   CREATININE 1.52 (H) 09/21/2019   GFRNONAA 33 (L) 09/21/2019   GFRAA 38 (L) 09/21/2019   CALCIUM 9.6 09/21/2019   PHOS 3.8 09/21/2019   PROT 7.0 03/19/2019   ALBUMIN 4.1 09/21/2019   LABGLOB 2.8 03/19/2019  AGRATIO 1.5 03/19/2019   BILITOT 0.2 03/19/2019   ALKPHOS 64 03/19/2019   AST 14 03/19/2019   ALT 12 03/19/2019   ANIONGAP 10 04/01/2018       Objective    BP (!) 174/95   Pulse 74   Temp 98.9 F (37.2 C)   Resp 16   Wt 184 lb (83.5 kg)   BMI 33.65 kg/m  BP Readings from Last 3 Encounters:  03/12/20 (!) 153/92  02/20/20 (!) 174/95  09/21/19 (!) 151/95   Wt Readings from Last 3 Encounters:  03/12/20 183 lb (83 kg)  02/20/20 184 lb (83.5 kg)  09/21/19 196 lb 3.2 oz (89 kg)       Physical Exam Vitals reviewed.  Constitutional:      General: She is not in acute distress.    Appearance: Normal appearance. She is well-developed and well-nourished. She is obese. She is not ill-appearing or diaphoretic.  Neck:     Thyroid: No thyromegaly.     Vascular: No JVD.     Trachea: No tracheal deviation.  Cardiovascular:     Rate and Rhythm: Normal rate and regular rhythm.     Heart sounds: Normal heart sounds. No murmur heard. No friction rub. No gallop.   Pulmonary:     Effort: Pulmonary effort is normal. No respiratory distress.     Breath sounds: Normal breath sounds. No wheezing or rales.  Musculoskeletal:     Cervical back: Normal range of motion and neck supple.  Lymphadenopathy:     Cervical: No cervical adenopathy.  Neurological:     Mental Status: She is alert.       Results for orders placed or performed in visit on 02/20/20  POCT glycosylated hemoglobin (Hb A1C)  Result Value Ref Range   Hemoglobin A1C 6.0 (A) 4.0 - 5.6 %   HbA1c POC (<> result, manual entry)     HbA1c, POC (prediabetic range)     HbA1c, POC (controlled diabetic range)      Assessment & Plan     1. Type 2 diabetes mellitus with stage 4 chronic kidney disease, without long-term current use of insulin (HCC) A1c much improved to 6.0. Decrease Metformin to 500mg  daily from BID. F/U in 3 months.  - POCT glycosylated hemoglobin (Hb A1C) - metFORMIN (GLUCOPHAGE) 500 MG tablet; Take 1 tablet (500 mg total) by mouth daily with breakfast.  Dispense: 90 tablet; Refill: 3  2. Primary hypertension Elevated. Continue Amlodipine 5mg  (dose dependent due to edema). Stop Benazepril. Start Losartan 100mg  as below. Continue Metoprolol XL 50mg  daily. On torsemide 10mg  daily as well. F/U in 2 weeks.  - amLODipine (NORVASC) 5 MG tablet; Take 1 tablet (5 mg total) by mouth daily.  Dispense: 90 tablet; Refill: 3 - losartan (COZAAR) 100 MG tablet; Take 1 tablet (100 mg total) by mouth daily.  Dispense: 90 tablet; Refill: 0   No follow-ups on file.      Reynolds Bowl, PA-C, have reviewed all documentation for this visit. The documentation on 03/14/20 for the exam, diagnosis, procedures, and orders are all accurate and complete.   Rubye Beach  [facility] [phone] (phone) [phone] (fax)  [place]

## 2020-03-12 ENCOUNTER — Other Ambulatory Visit: Payer: Self-pay

## 2020-03-12 ENCOUNTER — Ambulatory Visit (INDEPENDENT_AMBULATORY_CARE_PROVIDER_SITE_OTHER): Payer: Medicare Other | Admitting: Physician Assistant

## 2020-03-12 ENCOUNTER — Encounter: Payer: Self-pay | Admitting: Physician Assistant

## 2020-03-12 VITALS — BP 153/92 | HR 70 | Temp 97.9°F | Wt 183.0 lb

## 2020-03-12 DIAGNOSIS — I10 Essential (primary) hypertension: Secondary | ICD-10-CM | POA: Diagnosis not present

## 2020-03-12 NOTE — Progress Notes (Signed)
Established patient visit   Patient: Miranda Olsen   DOB: [birth year]   80 y.o. Female  MRN: [id number] Visit Date: 03/12/2020  Today's healthcare provider: Mar Daring, PA-C   Chief Complaint  Patient presents with  . Hypertension   Subjective    HPI  Hypertension, follow-up  BP Readings from Last 3 Encounters:  03/12/20 (!) 153/92  02/20/20 (!) 174/95  09/21/19 (!) 151/95   Wt Readings from Last 3 Encounters:  03/12/20 183 lb (83 kg)  02/20/20 184 lb (83.5 kg)  09/21/19 196 lb 3.2 oz (89 kg)     She was last seen for hypertension 3 weeks ago.  BP at that visit was 174/95. Management since that visit includes adding losartan $RemoveBeforeD'100mg'[id number]$  daily.  She reports excellent compliance with treatment. She is not having side effects.  She is following a Low Sodium diet. She is exercising. She does not smoke.  Use of agents associated with hypertension: none.   Outside blood pressures are . Symptoms: No chest pain No chest pressure  No palpitations No syncope  No dyspnea No orthopnea  No paroxysmal nocturnal dyspnea No lower extremity edema   Pertinent labs: Lab Results  Component Value Date   CHOL 326 (H) 03/19/2019   HDL 67 03/19/2019   LDLCALC 238 (H) 03/19/2019   TRIG 121 03/19/2019   CHOLHDL 4.9 (H) 03/19/2019   Lab Results  Component Value Date   NA 137 09/21/2019   K 4.9 09/21/2019   CREATININE 1.52 (H) 09/21/2019   GFRNONAA 33 (L) 09/21/2019   GFRAA 38 (L) 09/21/2019   GLUCOSE 100 (H) 09/21/2019     The ASCVD Risk score Mikey Bussing DC Jr., et al., 2013) failed to calculate for the following reasons:   The valid total cholesterol range is 130 to 320 mg/dL   ---------------------------------------------------------------------------------------------------   Patient Active Problem List   Diagnosis Date Noted  . Epigastric hernia 06/21/2019  . Diastasis recti 06/21/2019  . Class 2 obesity with body mass index (BMI) of 35.0 to 35.9 in adult  06/21/2019  . Bilateral inguinal hernia 06/21/2019  . Umbilical hernia without obstruction and without gangrene 06/21/2019  . CKD stage 3 due to type 2 diabetes mellitus ([place]) 06/18/2019  . Low back pain 07/11/2015  . Acute blood loss anemia 01/25/2015  . Chronic kidney disease (CKD), stage III (moderate) ([place]) 07/31/2014  . Dysfunction of eustachian tube 05/30/2014  . Acid reflux 05/30/2014  . Burning or prickling sensation 05/30/2014  . Deficiency of vitamin B 07/05/2008  . Anemia, deficiency 06/26/2008  . Avitaminosis D 06/26/2008  . Benign hypertension with CKD (chronic kidney disease) stage III ([place]) 06/15/2008  . Hypercholesteremia 06/12/2008  . Diabetes mellitus without complication ([place]) 70/17/7939  . Colon, diverticulosis 06/06/2008  . Apnea, sleep 01/26/2007  . History of colon polyps 08/09/2006   Past Medical History:  Diagnosis Date  . Diabetes mellitus without complication ([place])   . Hypercholesterolemia   . Hypertension    Social History   Tobacco Use  . Smoking status: Never Smoker  . Smokeless tobacco: Never Used  Vaping Use  . Vaping Use: Never used  Substance Use Topics  . Alcohol use: Not Currently    Comment: occasionally 1 glass of wine  . Drug use: No   Allergies  Allergen Reactions  . Clonidine Derivatives Other (See Comments)     Medications: Outpatient Medications Prior to Visit  Medication Sig  . albuterol (VENTOLIN HFA) 108 (90 Base) MCG/ACT inhaler  Inhale 2 puffs into the lungs every 6 (six) hours as needed for wheezing or shortness of breath.  Marland Kitchen amLODipine (NORVASC) 5 MG tablet Take 1 tablet (5 mg total) by mouth daily.  . ferrous sulfate 325 (65 FE) MG tablet TAKE 1 TABLET BY MOUTH EVERY DAY  . losartan (COZAAR) 100 MG tablet Take 1 tablet (100 mg total) by mouth daily.  . metFORMIN (GLUCOPHAGE) 500 MG tablet Take 1 tablet (500 mg total) by mouth daily with breakfast.  . metoprolol succinate (TOPROL-XL) 50 MG 24 hr tablet TAKE 1 TABLET  (50 MG TOTAL) BY MOUTH DAILY. TAKE WITH OR IMMEDIATELY FOLLOWING A MEAL.  Marland Kitchen omeprazole (PRILOSEC) 40 MG capsule TAKE 1 CAPSULE BY MOUTH EVERY DAY  . torsemide (DEMADEX) 10 MG tablet Take 1 tablet (10 mg total) by mouth every morning.  . Blood Glucose Monitoring Suppl (ONE TOUCH ULTRA SYSTEM KIT) w/Device KIT [place] blood sugar once per day (Patient not taking: No sig reported)  . cholecalciferol (VITAMIN D) 1000 UNITS tablet Take 1,000 Units by mouth daily. Pt taking 2000 daily  . glucose blood test strip Check blood sugar once daily (Patient not taking: No sig reported)  . Lancets (ONETOUCH ULTRASOFT) lancets Check blood sugar once daily (Patient not taking: No sig reported)  . rosuvastatin (CRESTOR) 5 MG tablet Take 1 tablet (5 mg total) by mouth daily. (Patient not taking: No sig reported)   No facility-administered medications prior to visit.    Review of Systems  Constitutional: Negative.   Respiratory: Negative.   Cardiovascular: Negative.   Gastrointestinal: Negative.   Neurological: Negative for dizziness, light-headedness and headaches.        Objective    BP (!) 153/92 (BP Location: Right Arm, Patient Position: Sitting, Cuff Size: Large)   Pulse 70   Temp 97.9 F (36.6 C) (Oral)   Wt 183 lb (83 kg)   BMI 33.47 kg/m    Physical Exam Vitals reviewed.  Constitutional:      General: She is not in acute distress.    Appearance: She is well-developed and well-nourished. She is not diaphoretic.  Neck:     Thyroid: No thyromegaly.     Vascular: No JVD.     Trachea: No tracheal deviation.  Cardiovascular:     Rate and Rhythm: Normal rate and regular rhythm.     Heart sounds: Normal heart sounds. No murmur heard. No friction rub. No gallop.   Pulmonary:     Effort: Pulmonary effort is normal. No respiratory distress.     Breath sounds: Normal breath sounds. No wheezing or rales.  Musculoskeletal:     Cervical back: Normal range of motion and neck supple.   Lymphadenopathy:     Cervical: No cervical adenopathy.     No results found for any visits on 03/12/20.  Assessment & Plan     1. Primary hypertension Resistant HTN. Continue medications as prescribed and will refer to Cardiology for further assistance. [facility] is preferred. Consult appreciated. Resistant HTN could be from kidney disease, but kidney disease has been stable, followed by Dr. Candiss Norse.   No follow-ups on file.      Reynolds Bowl, PA-C, have reviewed all documentation for this visit. The documentation on 03/12/20 for the exam, diagnosis, procedures, and orders are all accurate and complete.   Rubye Beach  [facility] [phone] (phone) [phone] (fax)  [place]

## 2020-03-17 ENCOUNTER — Telehealth: Payer: Self-pay

## 2020-03-17 NOTE — Telephone Encounter (Signed)
Spoke with pt who states she cannot come in the office due to transportation issues. Inquired about a virtual visit and pt agreed to complete visit over the phone. Pt requested an apt after the first two weeks of March. Scheduled apt for 04/07/20 @ 11:00 AM.

## 2020-03-17 NOTE — Telephone Encounter (Signed)
Copied from Pablo Pena [phone]. Topic: Appointment Scheduling - Scheduling Inquiry for Clinic >> Mar 17, 2020  8:22 AM Oneta Rack wrote:  Patient would like to cancel her Medicare Wellness appointment for 03/18/2020 please call her at [phone] to [facility]

## 2020-04-03 NOTE — Progress Notes (Signed)
This encounter was created in error - please disregard.

## 2020-04-07 ENCOUNTER — Other Ambulatory Visit: Payer: Self-pay

## 2020-04-08 NOTE — Progress Notes (Signed)
Subjective:   Miranda Olsen is a 80 y.o. female who presents for Medicare Annual (Subsequent) preventive examination.  I connected with Miranda Olsen today by telephone and verified that I am speaking with the correct person using two identifiers. Location patient: home Location provider: work Persons participating in the virtual visit: patient, provider.   I discussed the limitations, risks, security and privacy concerns of performing an evaluation and management service by telephone and the availability of in person appointments. I also discussed with the patient that there may be a patient responsible charge related to this service. The patient expressed understanding and verbally consented to this telephonic visit.    Interactive audio and video telecommunications were attempted between this provider and patient, however failed, due to patient having technical difficulties OR patient did not have access to video capability.  We continued and completed visit with audio only.   Review of Systems    N/A  Cardiac Risk Factors include: advanced age (>102mn, >>5women);diabetes mellitus;hypertension;dyslipidemia     Objective:    There were no vitals filed for this visit. There is no height or weight on file to calculate BMI.  Advanced Directives 04/09/2020 03/14/2019 04/01/2018 03/10/2018 02/24/2017 01/26/2015 12/19/2014  Does Patient Have a Medical Advance Directive? _0  No No  Would patient like information on creating a medical advance directive? No - Patient declined No - Patient declined - Yes (MAU/Ambulatory/Procedural Areas - Information given) No - Patient declined - -    Current Medications (verified) Outpatient Encounter Medications as of 04/09/2020  Medication Sig  . amLODipine (NORVASC) 5 MG tablet Take 1 tablet (5 mg total) by mouth daily.  . cholecalciferol (VITAMIN D) 1000 UNITS tablet Take 1,000 Units by mouth daily. Pt taking 2000 daily  . ferrous sulfate  325 (65 FE) MG tablet TAKE 1 TABLET BY MOUTH EVERY DAY  . losartan (COZAAR) 100 MG tablet Take 1 tablet (100 mg total) by mouth daily.  . metFORMIN (GLUCOPHAGE) 500 MG tablet Take 1 tablet (500 mg total) by mouth daily with breakfast.  . metoprolol succinate (TOPROL-XL) 50 MG 24 hr tablet TAKE 1 TABLET (50 MG TOTAL) BY MOUTH DAILY. TAKE WITH OR IMMEDIATELY FOLLOWING A MEAL.  .Marland Kitchenomeprazole (PRILOSEC) 40 MG capsule TAKE 1 CAPSULE BY MOUTH EVERY DAY  . torsemide (DEMADEX) 10 MG tablet Take 1 tablet (10 mg total) by mouth every morning.  .Marland Kitchenalbuterol (VENTOLIN HFA) 108 (90 Base) MCG/ACT inhaler Inhale 2 puffs into the lungs every 6 (six) hours as needed for wheezing or shortness of breath. (Patient not taking: Reported on 04/09/2020)  . Blood Glucose Monitoring Suppl (ONE TOUCH ULTRA SYSTEM KIT) w/Device KIT [place] blood sugar once per day (Patient not taking: No sig reported)  . glucose blood test strip Check blood sugar once daily (Patient not taking: No sig reported)  . Lancets (ONETOUCH ULTRASOFT) lancets Check blood sugar once daily (Patient not taking: No sig reported)  . rosuvastatin (CRESTOR) 5 MG tablet Take 1 tablet (5 mg total) by mouth daily. (Patient not taking: No sig reported)   No facility-administered encounter medications on file as of 04/09/2020.    Allergies (verified) Clonidine derivatives   History: Past Medical History:  Diagnosis Date  . Diabetes mellitus without complication (HBeggs   . Hypercholesterolemia   . Hypertension    Past Surgical History:  Procedure Laterality Date  . COLONOSCOPY WITH PROPOFOL N/A 01/27/2015   Procedure: COLONOSCOPY WITH PROPOFOL;  Surgeon: MJosefine Class MD;  Location: [facility] ENDOSCOPY;  Service: Endoscopy;  Laterality: N/A;  . EYE SURGERY Left 01/21/2009    cataract surgery   . EYE SURGERY Right 04/08/2009   cataract surgery  . REPLACEMENT TOTAL KNEE Left 10/08/2009   Dr. Marry Guan   Family History  Problem Relation Age of Onset  .  Parkinsonism Mother   . Leukemia Father   . Cancer Sister        breast  . Breast cancer Sister 61  . Congestive Heart Failure Sister   . Congestive Heart Failure Sister   . Lung cancer Brother   . Breast cancer Maternal Aunt 60  . Breast cancer Other 40   Social History   Socioeconomic History  . Marital status: Widowed    Spouse name: Not on file  . Number of children: 2  . Years of education: H/S  . Highest education level: 11th grade  Occupational History  . Occupation: Retired  Tobacco Use  . Smoking status: Never Smoker  . Smokeless tobacco: Never Used  Vaping Use  . Vaping Use: Never used  Substance and Sexual Activity  . Alcohol use: Not Currently    Comment: occasionally 1 glass of wine  . Drug use: No  . Sexual activity: Not on file  Other Topics Concern  . Not on file  Social History Narrative  . Not on file   Social Determinants of Health   Financial Resource Strain: Low Risk   . Difficulty of Paying Living Expenses: Not hard at all  Food Insecurity: No Food Insecurity  . Worried About Charity fundraiser in the Last Year: Never true  . Ran Out of Food in the Last Year: Never true  Transportation Needs: No Transportation Needs  . Lack of Transportation (Medical): No  . Lack of Transportation (Non-Medical): No  Physical Activity: Inactive  . Days of Exercise per Week: 0 days  . Minutes of Exercise per Session: 0 min  Stress: No Stress Concern Present  . Feeling of Stress : Only a little  Social Connections: Moderately Isolated  . Frequency of Communication with Friends and Family: More than three times a week  . Frequency of Social Gatherings with Friends and Family: More than three times a week  . Attends Religious Services: More than 4 times per year  . Active Member of Clubs or Organizations: No  . Attends Archivist Meetings: Never  . Marital Status: Widowed    Tobacco Counseling Counseling given: Not Answered   Clinical  Intake:  Pre-visit preparation completed: Yes  Pain : No/denies pain     Nutritional Risks: None Diabetes: Yes  How often do you need to have someone help you when you read instructions, pamphlets, or other written materials from your doctor or pharmacy?: 1 - Never  Diabetic? Yes  Nutrition Risk Assessment:  Has the patient had any N/V/D within the last 2 months?  No  Does the patient have any non-healing wounds?  No  Has the patient had any unintentional weight loss or weight gain?  No   Diabetes:  Is the patient diabetic?  Yes  If diabetic, was a CBG obtained today?  No  Did the patient bring in their glucometer from home?  No  How often do you monitor your CBG's? Does not check BS.   Financial Strains and Diabetes Management:  Are you having any financial strains with the device, your supplies or your medication? No .  Does the patient want to be seen  by Chronic Care Management for management of their diabetes?  No  Would the patient like to be referred to a Nutritionist or for Diabetic Management?  No   Diabetic Exams:  Diabetic Eye Exam: Completed 09/10/19 Diabetic Foot Exam: Overdue, Pt has been advised about the importance in completing this exam.    Interpreter Needed?: No  Information entered by :: [facility] LPN   Activities of Daily Living In your present state of health, do you have any difficulty performing the following activities: 04/09/2020  Hearing? Y  Comment Does not wear hearing aids.  Vision? N  Difficulty concentrating or making decisions? N  Walking or climbing stairs? N  Dressing or bathing? N  Doing errands, shopping? N  Preparing Food and eating ? N  Using the Toilet? N  In the past six months, have you accidently leaked urine? Y  Comment Occasionally with urges.  Do you have problems with loss of bowel control? N  Managing your Medications? N  Managing your Finances? N  Housekeeping or managing your Housekeeping? N  Some recent  data might be hidden    Patient Care Team: Mar Daring, PA-C as PCP - General (Family Medicine) Birder Robson, MD as Referring Physician (Ophthalmology) Murlean Iba, MD (Nephrology) Germaine Pomfret, [facility] (Pharmacist) Minna Merritts, MD as Consulting Physician (Cardiology)  Indicate any recent Medical Services you may have received from other than [facility] providers in the past year (date may be approximate).     Assessment:   This is a routine wellness examination for Reyes.  Hearing/Vision screen No exam data present  Dietary issues and exercise activities discussed: Current Exercise Habits: The patient does not participate in regular exercise at present, Exercise limited by: orthopedic condition(s)  Goals    . Chronic Care Management     CARE PLAN ENTRY (see longitudinal plan of care for additional care plan information)  Current Barriers:  . Chronic Disease Management support, education, and care coordination needs related to Hypertension, Hyperlipidemia, Diabetes, and Chronic Kidney Disease   Hypertension BP Readings from Last 3 Encounters:  09/21/19 (!) 151/95  06/21/19 (!) 168/92  06/18/19 (!) 151/98   . Pharmacist Clinical Goal(s): o Over the next 90 days, patient will work with PharmD and providers to achieve BP goal <140/90 . Current regimen:  . Amlodipine 83m daily . Benazepril 418mtwice daily . Toprol XL 5037maily after food . Torsemide 65m66mily . Interventions: o None . Patient self care activities - Over the next 90 days, patient will: o Check BP weekly, document, and provide at future appointments o Ensure daily salt intake < 2300 mg/day  Hyperlipidemia Lab Results  Component Value Date/Time   LDLCALC 238 (H) 03/19/2019 10:53 AM   . Pharmacist Clinical Goal(s): o Over the next 90 days, patient will work with PharmD and providers to achieve LDL goal < 70 . Current regimen:  o Crestor 5mg 74mly . Interventions: o Restart  rosuvastatin 5mg d9my . Patient self care activities - Over the next 90 days, patient will: o Take rosuvastatin every day as directed  Diabetes Lab Results  Component Value Date/Time   HGBA1C 6.6 (A) 09/21/2019 09:22 AM   HGBA1C 6.4 (A) 06/18/2019 09:10 AM   HGBA1C 6.5 (H) 03/19/2019 10:53 AM   HGBA1C 6.1 (H) 05/04/2018 09:34 AM   . Pharmacist Clinical Goal(s): o Over the next 90 days, patient will work with PharmD and providers to maintain A1c goal <7% . Current regimen:  o Metformin 1000mg35m  daily with breakfast . Interventions: o None . Patient self care activities - Over the next 90 days, patient will: o Check blood sugar once daily, document, and provide at future appointments o Contact provider with any episodes of hypoglycemia  Medication management . Pharmacist Clinical Goal(s): o Over the next 90 days, patient will work with PharmD and providers to achieve optimal medication adherence . Current pharmacy: [facility] . Interventions o Comprehensive medication review performed. o Continue current medication management strategy . Patient self care activities - Over the next 90 days, patient will: o Focus on medication adherence by taking Crestor every day o Take medications as prescribed o Report any questions or concerns to PharmD and/or provider(s)  Initial goal documentation     . Exercise 3x per week (30 min per time)     Recommend to exercise for 3 days a week for at least 30 minutes at a time.     . Have 3 meals a day     Recommend to decrease portion sizes by eating 3 small healthy meals and at least 2 healthy snacks per day.      Depression Screen PHQ 2/9 Scores 04/09/2020 03/14/2019 03/10/2018 02/24/2017 02/24/2017 02/18/2016 02/02/2016  PHQ - 2 Score 1 0 2 0 0 0 0  PHQ- 9 Score - - 5 3 - - -    Fall Risk Fall Risk  04/09/2020 06/21/2019 03/14/2019 11/13/2018 03/10/2018  Falls in the past year? 1 0 0 - 1  Number falls in past yr: 0 0 0 - 0  Injury with Fall? 0 0 0 - 0   Follow up Falls prevention discussed - - Falls evaluation completed Falls prevention discussed    FALL RISK PREVENTION PERTAINING TO THE HOME:  Any stairs in or around the home? Yes  If so, are there any without handrails? No  Home free of loose throw rugs in walkways, pet beds, electrical cords, etc? Yes  Adequate lighting in your home to reduce risk of falls? Yes   ASSISTIVE DEVICES UTILIZED TO PREVENT FALLS:  Life alert? No  Use of a cane, walker or w/c? No  Grab bars in the bathroom? No  Shower chair or bench in shower? No  Elevated toilet seat or a handicapped toilet? No    Cognitive Function: Normal cognitive status assessed by observation by this Nurse Health Advisor. No abnormalities found.       6CIT Screen 03/14/2019 03/10/2018  What Year? 0 points 0 points  What month? 0 points 0 points  What time? 0 points 0 points  Count back from 20 0 points 0 points  Months in reverse 0 points 0 points  Repeat phrase 0 points 0 points  Total Score 0 0    Immunizations Immunization History  Administered Date(s) Administered  . Moderna Sars-Covid-2 Vaccination 02/06/2019, 03/06/2019, 11/28/2019  . Pneumococcal Conjugate-13 10/03/2013  . Pneumococcal Polysaccharide-23 03/25/2011    TDAP status: Due, Education has been provided regarding the importance of this vaccine. Advised may receive this vaccine at local pharmacy or Health Dept. Aware to provide a copy of the vaccination record if obtained from local pharmacy or Health Dept. Verbalized acceptance and understanding.  Flu Vaccine status: Declined, Education has been provided regarding the importance of this vaccine but patient still declined. Advised may receive this vaccine at local pharmacy or Health Dept. Aware to provide a copy of the vaccination record if obtained from local pharmacy or Health Dept. Verbalized acceptance and understanding.  Pneumococcal vaccine status: Up  to date  Covid-19 vaccine status: Completed  vaccines  Qualifies for Shingles Vaccine? Yes   Zostavax completed No   Shingrix Completed?: No.    Education has been provided regarding the importance of this vaccine. Patient has been advised to call insurance company to determine out of pocket expense if they have not yet received this vaccine. Advised may also receive vaccine at local pharmacy or Health Dept. Verbalized acceptance and understanding.  Screening Tests Health Maintenance  Topic Date Due  . FOOT EXAM  05/04/2019  . INFLUENZA VACCINE  05/26/2020 (Originally 08/26/2019)  . TETANUS/TDAP  04/09/2021 (Originally 08/25/1959)  . HEMOGLOBIN A1C  08/19/2020  . OPHTHALMOLOGY EXAM  09/09/2020  . DEXA SCAN  Completed  . COVID-19 Vaccine  Completed  . PNA vac Low Risk Adult  Completed  . HPV VACCINES  Aged Out    Health Maintenance  Health Maintenance Due  Topic Date Due  . FOOT EXAM  05/04/2019    Colorectal cancer screening: No longer required.   Mammogram status: No longer required due to age.  Bone Density status: Completed 04/10/19. Results reflect: Previous DEXA scan was normal. No repeat needed unless advised by a physician.  Lung Cancer Screening: (Low Dose CT Chest recommended if Age 23-80 years, 30 pack-year currently smoking OR have quit w/in 15years.) does not qualify.   Additional Screening:  Vision Screening: Recommended annual ophthalmology exams for early detection of glaucoma and other disorders of the eye. Is the patient up to date with their annual eye exam?  Yes  Who is the provider or what is the name of the office in which the patient attends annual eye exams? Dr George Ina @ [place] If pt is not established with a provider, would they like to be referred to a provider to establish care? No .   Dental Screening: Recommended annual dental exams for proper oral hygiene  Community Resource Referral / Chronic Care Management: CRR required this visit?  No   CCM required this visit?  No      Plan:     I  have personally reviewed and noted the following in the patient's chart:   . Medical and social history . Use of alcohol, tobacco or illicit drugs  . Current medications and supplements . Functional ability and status . Nutritional status . Physical activity . Advanced directives . List of other physicians . Hospitalizations, surgeries, and ER visits in previous 12 months . Vitals . Screenings to include cognitive, depression, and falls . Referrals and appointments  In addition, I have reviewed and discussed with patient certain preventive protocols, quality metrics, and best practice recommendations. A written personalized care plan for preventive services as well as general preventive health recommendations were provided to patient.     Leonard Hendler [place], [place]   05/23/7679   Nurse Notes: Pt needs a diabetic foot exam at next in office apt. Pt declined receiving a flu shot.

## 2020-04-09 ENCOUNTER — Ambulatory Visit (INDEPENDENT_AMBULATORY_CARE_PROVIDER_SITE_OTHER): Payer: Medicare Other

## 2020-04-09 ENCOUNTER — Other Ambulatory Visit: Payer: Self-pay

## 2020-04-09 DIAGNOSIS — Z0000 Encounter for general adult medical examination without abnormal findings: Secondary | ICD-10-CM

## 2020-04-09 NOTE — Patient Instructions (Signed)
[name] , Thank you for taking time to come for your Medicare Wellness Visit. I appreciate your ongoing commitment to your health goals. Please review the following plan we discussed and let me know if I can assist you in the future.   Screening recommendations/referrals: Colonoscopy: No longer required.  Mammogram: No longer required.  Bone Density: Previous DEXA scan was normal. No repeat needed unless advised by a physician. Recommended yearly ophthalmology/optometry visit for glaucoma screening and checkup Recommended yearly dental visit for hygiene and checkup  Vaccinations: Influenza vaccine: Currently due, declined receiving.  Pneumococcal vaccine: Completed series Tdap vaccine: Currently due, declined receiving.  Shingles vaccine: Shingrix discussed. Please contact your pharmacy for coverage information.     Advanced directives: Advance directive discussed with you today. Even though you declined this today please call our office should you change your mind and we can give you the proper paperwork for you to fill out.  Conditions/risks identified: Recommend to start to walking 3 days a week for at least 30 minutes at a time.   Next appointment: None, declined scheduling a follow up with PCP or an AWV for 2023 at this time.    Preventive Care 79 Years and Older, Female Preventive care refers to lifestyle choices and visits with your health care provider that can promote health and wellness. What does preventive care include?  A yearly physical exam. This is also called an annual well check.  Dental exams once or twice a year.  Routine eye exams. Ask your health care provider how often you should have your eyes checked.  Personal lifestyle choices, including:  Daily care of your teeth and gums.  Regular physical activity.  Eating a healthy diet.  Avoiding tobacco and drug use.  Limiting alcohol use.  Practicing safe sex.  Taking low-dose aspirin every  day.  Taking vitamin and mineral supplements as recommended by your health care provider. What happens during an annual well check? The services and screenings done by your health care provider during your annual well check will depend on your age, overall health, lifestyle risk factors, and family history of disease. Counseling  Your health care provider may ask you questions about your:  Alcohol use.  Tobacco use.  Drug use.  Emotional well-being.  Home and relationship well-being.  Sexual activity.  Eating habits.  History of falls.  Memory and ability to understand (cognition).  Work and work Statistician.  Reproductive health. Screening  You may have the following tests or measurements:  Height, weight, and BMI.  Blood pressure.  Lipid and cholesterol levels. These may be checked every 5 years, or more frequently if you are over 63 years old.  Skin check.  Lung cancer screening. You may have this screening every year starting at age 65 if you have a 30-pack-year history of smoking and currently smoke or have quit within the past 15 years.  Fecal occult blood test (FOBT) of the stool. You may have this test every year starting at age 11.  Flexible sigmoidoscopy or colonoscopy. You may have a sigmoidoscopy every 5 years or a colonoscopy every 10 years starting at age 35.  Hepatitis C blood test.  Hepatitis B blood test.  Sexually transmitted disease (STD) testing.  Diabetes screening. This is done by checking your blood sugar (glucose) after you have not eaten for a while (fasting). You may have this done every 1-3 years.  Bone density scan. This is done to screen for osteoporosis. You may have this done  starting at age 41.  Mammogram. This may be done every 1-2 years. Talk to your health care provider about how often you should have regular mammograms. Talk with your health care provider about your test results, treatment options, and if necessary, the need  for more tests. Vaccines  Your health care provider may recommend certain vaccines, such as:  Influenza vaccine. This is recommended every year.  Tetanus, diphtheria, and acellular pertussis (Tdap, Td) vaccine. You may need a Td booster every 10 years.  Zoster vaccine. You may need this after age 27.  Pneumococcal 13-valent conjugate (PCV13) vaccine. One dose is recommended after age 32.  Pneumococcal polysaccharide (PPSV23) vaccine. One dose is recommended after age 47. Talk to your health care provider about which screenings and vaccines you need and how often you need them. This information is not intended to replace advice given to you by your health care provider. Make sure you discuss any questions you have with your health care provider. Document Released: 02/07/2015 Document Revised: 10/01/2015 Document Reviewed: 11/12/2014 Elsevier Interactive Patient Education  2017 [place] Prevention in the Home Falls can cause injuries. They can happen to people of all ages. There are many things you can do to make your home safe and to help prevent falls. What can I do on the outside of my home?  Regularly fix the edges of walkways and driveways and fix any cracks.  Remove anything that might make you trip as you walk through a door, such as a raised step or threshold.  Trim any bushes or trees on the path to your home.  Use bright outdoor lighting.  Clear any walking paths of anything that might make someone trip, such as rocks or tools.  Regularly check to see if handrails are loose or broken. Make sure that both sides of any steps have handrails.  Any raised decks and porches should have guardrails on the edges.  Have any leaves, snow, or ice cleared regularly.  Use sand or salt on walking paths during winter.  Clean up any spills in your garage right away. This includes oil or grease spills. What can I do in the bathroom?  Use night lights.  Install grab bars  by the toilet and in the tub and shower. Do not use towel bars as grab bars.  Use non-skid mats or decals in the tub or shower.  If you need to sit down in the shower, use a plastic, non-slip stool.  Keep the floor dry. Clean up any water that spills on the floor as soon as it happens.  Remove soap buildup in the tub or shower regularly.  Attach bath mats securely with double-sided non-slip rug tape.  Do not have throw rugs and other things on the floor that can make you trip. What can I do in the bedroom?  Use night lights.  Make sure that you have a light by your bed that is easy to reach.  Do not use any sheets or blankets that are too big for your bed. They should not hang down onto the floor.  Have a firm chair that has side arms. You can use this for support while you get dressed.  Do not have throw rugs and other things on the floor that can make you trip. What can I do in the kitchen?  Clean up any spills right away.  Avoid walking on wet floors.  Keep items that you use a lot in easy-to-reach places.  If you need to reach something above you, use a strong step stool that has a grab bar.  Keep electrical cords out of the way.  Do not use floor polish or wax that makes floors slippery. If you must use wax, use non-skid floor wax.  Do not have throw rugs and other things on the floor that can make you trip. What can I do with my stairs?  Do not leave any items on the stairs.  Make sure that there are handrails on both sides of the stairs and use them. Fix handrails that are broken or loose. Make sure that handrails are as long as the stairways.  Check any carpeting to make sure that it is firmly attached to the stairs. Fix any carpet that is loose or worn.  Avoid having throw rugs at the top or bottom of the stairs. If you do have throw rugs, attach them to the floor with carpet tape.  Make sure that you have a light switch at the top of the stairs and the  bottom of the stairs. If you do not have them, ask someone to add them for you. What else can I do to help prevent falls?  Wear shoes that:  Do not have high heels.  Have rubber bottoms.  Are comfortable and fit you well.  Are closed at the toe. Do not wear sandals.  If you use a stepladder:  Make sure that it is fully opened. Do not climb a closed stepladder.  Make sure that both sides of the stepladder are locked into place.  Ask someone to hold it for you, if possible.  Clearly mark and make sure that you can see:  Any grab bars or handrails.  First and last steps.  Where the edge of each step is.  Use tools that help you move around (mobility aids) if they are needed. These include:  Canes.  Walkers.  Scooters.  Crutches.  Turn on the lights when you go into a dark area. Replace any light bulbs as soon as they burn out.  Set up your furniture so you have a clear path. Avoid moving your furniture around.  If any of your floors are uneven, fix them.  If there are any pets around you, be aware of where they are.  Review your medicines with your doctor. Some medicines can make you feel dizzy. This can increase your chance of falling. Ask your doctor what other things that you can do to help prevent falls. This information is not intended to replace advice given to you by your health care provider. Make sure you discuss any questions you have with your health care provider. Document Released: 11/07/2008 Document Revised: 06/19/2015 Document Reviewed: 02/15/2014 Elsevier Interactive Patient Education  2017 [employer].

## 2020-04-13 ENCOUNTER — Other Ambulatory Visit: Payer: Self-pay | Admitting: Physician Assistant

## 2020-04-13 DIAGNOSIS — N183 Chronic kidney disease, stage 3 unspecified: Secondary | ICD-10-CM

## 2020-04-13 DIAGNOSIS — I10 Essential (primary) hypertension: Secondary | ICD-10-CM

## 2020-04-13 DIAGNOSIS — I129 Hypertensive chronic kidney disease with stage 1 through stage 4 chronic kidney disease, or unspecified chronic kidney disease: Secondary | ICD-10-CM

## 2020-04-13 NOTE — Telephone Encounter (Signed)
Requested Prescriptions  Pending Prescriptions Disp Refills  . amLODipine (NORVASC) 5 MG tablet [Pharmacy Med Name: AMLODIPINE BESYLATE 5 MG TAB] 90 tablet 3    Sig: TAKE 1 TABLET BY MOUTH EVERY DAY     Cardiovascular:  Calcium Channel Blockers Failed - 04/13/2020 12:46 AM      Failed - Last BP in normal range    BP Readings from Last 1 Encounters:  03/12/20 (!) 153/92         Passed - Valid encounter within last 6 months    Recent Outpatient Visits          1 month ago Resistant hypertension   [place], [place], PA-C   1 month ago Type 2 diabetes mellitus with stage 4 chronic kidney disease, without long-term current use of insulin [facility])   [facility] Roy, [place], PA-C   6 months ago Type 2 diabetes mellitus with stage 4 chronic kidney disease, without long-term current use of insulin [facility])   [place], [place], [place]   10 months ago Diabetes mellitus without complication [facility])   [place], [place], PA-C   1 year ago CKD stage 3 due to type 2 diabetes mellitus [facility])   [place], Clearnce Sorrel, [place]      Future Appointments            In 1 week [place], Kathlene November, MD [facility], [facility]           . benazepril (LOTENSIN) 40 MG tablet [Pharmacy Med Name: BENAZEPRIL HCL 40 MG TABLET] 180 tablet     Sig: TAKE 1 TABLET BY MOUTH TWICE A DAY     Cardiovascular:  ACE Inhibitors Failed - 04/13/2020 12:46 AM      Failed - Cr in normal range and within 180 days    Creatinine, Ser  Date Value Ref Range Status  09/21/2019 1.52 (H) 0.57 - 1.00 mg/dL Final         Failed - K in normal range and within 180 days    Potassium  Date Value Ref Range Status  09/21/2019 4.9 3.5 - 5.2 mmol/L Final         Failed - Last BP in normal range    BP Readings from Last 1 Encounters:  03/12/20 (!) 153/92         Passed - Patient is not  pregnant      Passed - Valid encounter within last 6 months    Recent Outpatient Visits          1 month ago Resistant hypertension   [place], [place], PA-C   1 month ago Type 2 diabetes mellitus with stage 4 chronic kidney disease, without long-term current use of insulin ([place])   [facility] Christie, [place], PA-C   6 months ago Type 2 diabetes mellitus with stage 4 chronic kidney disease, without long-term current use of insulin [facility])   [place], [place], [place]   10 months ago Diabetes mellitus without complication [facility])   [place], [place], [place]   1 year ago CKD stage 3 due to type 2 diabetes mellitus [facility])   [place], Clearnce Sorrel, [place]      Future Appointments            In 1 week [place], Kathlene November, MD [facility], [facility]

## 2020-04-22 ENCOUNTER — Encounter: Payer: Self-pay | Admitting: Cardiovascular Disease

## 2020-04-22 ENCOUNTER — Ambulatory Visit (INDEPENDENT_AMBULATORY_CARE_PROVIDER_SITE_OTHER): Payer: Medicare Other | Admitting: Cardiovascular Disease

## 2020-04-22 ENCOUNTER — Other Ambulatory Visit: Payer: Self-pay

## 2020-04-22 VITALS — BP 140/98 | HR 72 | Ht 62.0 in | Wt 186.0 lb

## 2020-04-22 DIAGNOSIS — E119 Type 2 diabetes mellitus without complications: Secondary | ICD-10-CM | POA: Diagnosis not present

## 2020-04-22 DIAGNOSIS — E7800 Pure hypercholesterolemia, unspecified: Secondary | ICD-10-CM | POA: Diagnosis not present

## 2020-04-22 DIAGNOSIS — N183 Chronic kidney disease, stage 3 unspecified: Secondary | ICD-10-CM | POA: Diagnosis not present

## 2020-04-22 DIAGNOSIS — I129 Hypertensive chronic kidney disease with stage 1 through stage 4 chronic kidney disease, or unspecified chronic kidney disease: Secondary | ICD-10-CM | POA: Diagnosis not present

## 2020-04-22 MED ORDER — EZETIMIBE 10 MG PO TABS: 10.0000 mg | ORAL_TABLET | Freq: Every day | ORAL | 3 refills | Status: DC

## 2020-04-22 MED ORDER — DOXAZOSIN MESYLATE 2 MG PO TABS: 2.0000 mg | ORAL_TABLET | Freq: Every day | ORAL | 2 refills | Status: DC

## 2020-04-22 NOTE — Patient Instructions (Addendum)
Need blood pressure cuff from [facility]  Medication Instructions:   START  Cardura/doxazosin  2 mg once a day in the morning  ok to start 1 mg for the first week or two   Zetia 10 mg once a day  For your cholesterol   Losartan 100 mg in the morning  Please start taking metoprolol and amlodipine to after dinner (in the evening)   Lab work: No new labs needed  Testing/Procedures: No new testing needed   Follow-Up:   . You will need a follow up appointment in 6 months  . Providers on your designated Care Team:   . Murray Hodgkins, NP . Christell Faith, PA-C . Marrianne Mood, PA-C   Please monitor blood pressures and keep a log of your readings when you get your BP machine from [facility]   How to use a home blood pressure monitor. . Be still. . Measure at the same time every day. It's important to take the readings at the same time each day, such as morning and evening. Take reading approximately 1 1/2 to 2 hours after BP medications.   AVOID these things for 30 minutes before checking your blood pressure:  Drinking caffeine.  Drinking alcohol.  Eating.  Smoking.  Exercising.   Five minutes before checking your blood pressure:  Pee.  Sit in a dining chair. Avoid sitting in a soft couch or armchair.  Be quiet. Do not talk.       Sit correctly. Sit with your back straight and supported (on a dining chair, rather than a sofa). Your feet should be flat on the floor and your legs should not be crossed. Your arm should be supported on a flat surface (such as a table) with the upper arm at heart level. Make sure the bottom of the cuff is placed directly above the bend of the elbow.

## 2020-04-22 NOTE — Progress Notes (Signed)
Cardiology Office Note  Date:  04/22/2020   ID:  Miranda, Olsen Jun 29, 1940, MRN [id number]  PCP:  Mar Daring, PA-C   Chief Complaint  Patient presents with  . New Patient (Initial Visit)    Establish care with provider for blood pressure issues and get heart checked out. Medications verbally reviewed with patient.     HPI:  [name] is a 80 year old woman with past medical history of Hypertension Hyperlipidemia Chronic kidney disease , GFR greater than 50 Diabetes Scoliosis Who presents by referral from Cher Nakai for hypertension management  Reports that she is doing well, does not have a good blood pressure cuff at home Has a wrist cuff which she does not think is accurate Son has a blood pressure cuff but the cuff sizes too big She is going to call [place] see if she can get a better blood pressure cuff Feels asymptomatic, in terms of her blood pressure  Weight trending down recently, 196 in 2021 now down to 183 in February Active with no regular exercise program  Current medication list includes losartan 100 daily, metoprolol succinate 50 daily, amlodipine 5 daily Unable to tolerate amlodipine 10 secondary to leg swelling  On torsemide 10 daily, started by Dr. Candiss Norse  Last seen by nephrology November 2021, blood pressure at that time 168/104  Recent stressors, son passed away 11/14/2019 Blood pressure is markedly elevated at that time  EKG personally reviewed by myself on todays visit Shows normal sinus rhythm with rate 72 bpm no significant ST or T wave changes  CT scan abdomen pelvis March 2020 images pulled up and reviewed Small to moderate sized hiatal hernia, umbilical hernia, Tortuous aorta Minimal aortic atherosclerosis  Family history Mother with high blood pressure   PMH:   has a past medical history of Diabetes mellitus without complication ([place]), Hypercholesterolemia, and Hypertension.  PSH:    Past Surgical  History:  Procedure Laterality Date  . COLONOSCOPY WITH PROPOFOL N/A 01/27/2015   Procedure: COLONOSCOPY WITH PROPOFOL;  Surgeon: Josefine Class, MD;  Location[facility] ENDOSCOPY;  Service: Endoscopy;  Laterality: N/A;  . EYE SURGERY Left 01/21/2009    cataract surgery   . EYE SURGERY Right 04/08/2009   cataract surgery  . REPLACEMENT TOTAL KNEE Left 10/08/2009   Dr. Marry Guan    Current Outpatient Medications  Medication Sig Dispense Refill  . amLODipine (NORVASC) 5 MG tablet TAKE 1 TABLET BY MOUTH EVERY DAY 90 tablet 3  . cholecalciferol (VITAMIN D) 1000 UNITS tablet Take 1,000 Units by mouth daily. Pt taking 2000 daily    . ferrous sulfate 325 (65 FE) MG tablet TAKE 1 TABLET BY MOUTH EVERY DAY 90 tablet 0  . losartan (COZAAR) 100 MG tablet Take 1 tablet (100 mg total) by mouth daily. 90 tablet 0  . metFORMIN (GLUCOPHAGE) 500 MG tablet Take 1 tablet (500 mg total) by mouth daily with breakfast. 90 tablet 3  . metoprolol succinate (TOPROL-XL) 50 MG 24 hr tablet TAKE 1 TABLET (50 MG TOTAL) BY MOUTH DAILY. TAKE WITH OR IMMEDIATELY FOLLOWING A MEAL. 90 tablet 0  . omeprazole (PRILOSEC) 40 MG capsule TAKE 1 CAPSULE BY MOUTH EVERY DAY 90 capsule 1  . torsemide (DEMADEX) 10 MG tablet Take 1 tablet (10 mg total) by mouth every morning. 90 tablet 3   No current facility-administered medications for this visit.    Allergies:   Clonidine derivatives   Social History:  The patient  reports that she has never  smoked. She has never used smokeless tobacco. She reports previous alcohol use. She reports that she does not use drugs.   Family History:   family history includes Breast cancer (age of onset: 78) in an other family member; Breast cancer (age of onset: 25) in her sister; Breast cancer (age of onset: 66) in her maternal aunt; Cancer in her sister; Congestive Heart Failure in her sister and sister; Leukemia in her father; Lung cancer in her brother; Parkinsonism in her mother.    Review of  Systems: Review of Systems  Constitutional: Negative.   HENT: Negative.   Respiratory: Negative.   Cardiovascular: Negative.   Gastrointestinal: Negative.   Musculoskeletal: Positive for back pain.  Neurological: Negative.   Psychiatric/Behavioral: Negative.   All other systems reviewed and are negative.   PHYSICAL EXAM: VS:  BP (!) 140/98 (BP Location: Left Arm, Patient Position: Sitting, Cuff Size: Normal)   Pulse 72   Ht '5\' 2"'$  (1.575 m)   Wt 186 lb (84.4 kg)   SpO2 95%   BMI 34.02 kg/m  , BMI Body mass index is 34.02 kg/m. GEN: Well nourished, well developed, in no acute distress HEENT: normal Neck: no JVD, carotid bruits, or masses Cardiac: RRR; no murmurs, rubs, or gallops,no edema  Respiratory:  clear to auscultation bilaterally, normal work of breathing GI: soft, nontender, nondistended, + BS MS: no deformity or atrophy Skin: warm and dry, no rash Neuro:  Strength and sensation are intact Psych: euthymic mood, full affect   Recent Labs: 06/18/2019: BNP 9.2; Hemoglobin 11.3; Platelets 213 09/21/2019: BUN 29; Creatinine, Ser 1.52; Potassium 4.9; Sodium 137    Lipid Panel Lab Results  Component Value Date   CHOL 326 (H) 03/19/2019   HDL 67 03/19/2019   LDLCALC 238 (H) 03/19/2019   TRIG 121 03/19/2019      Wt Readings from Last 3 Encounters:  04/22/20 186 lb (84.4 kg)  03/12/20 183 lb (83 kg)  02/20/20 184 lb (83.5 kg)       ASSESSMENT AND PLAN:  Problem List Items Addressed This Visit      Cardiology Problems   Benign hypertension with CKD (chronic kidney disease) stage III ([place]) - Primary   Relevant Orders   EKG 12-Lead   Hypercholesteremia     Other   Diabetes mellitus without complication ([place])   Relevant Orders   EKG 12-Lead     Essential hypertension Discussed various treatment options, she takes all of her medications in the morning including metoprolol, losartan, amlodipine Does not have a good way of measuring blood pressure at  home, recommended she call [place] health to see if she qualifies for blood pressure cuff at home -Suggested she move losartan to the morning, Metoprolol and amlodipine 2 after dinner in the evening For blood pressure we will add Cardura 1 mg up to 2 mg in the morning Recommend she monitor blood pressures at home and call our office with numbers -Recommend low-salt diet We will hold off on echocardiogram at this time but this could be ordered in the future  Hyperlipidemia Numbers markedly elevated Reports having difficulty tolerating Crestor, takes it sparingly Recommend she start Zetia 10 mg daily with as much Crestor she is able to tolerate Minimal aortic atherosclerosis on CT scan  Obesity We have encouraged continued exercise, careful diet management in an effort to lose weight.  Diabetes type 2 Recommend she follow a low carbohydrate diet, walking program A1c steady 6.6    Total encounter time more than  60 minutes  Greater than 50% was spent in counseling and coordination of care with the patient    Signed, Esmond Plants, M.D., Ph.D. [place], [place]

## 2020-05-01 ENCOUNTER — Ambulatory Visit: Payer: Medicare Other | Admitting: Family Medicine

## 2020-05-15 NOTE — Progress Notes (Signed)
Established patient visit   Patient: Miranda Olsen   DOB: Jul 25, 1940   80 y.o. Female  MRN: [id number] Visit Date: 05/20/2020  Today's healthcare provider: Lavon Paganini, MD   Chief Complaint  Patient presents with  . Hypertension  . Hyperlipidemia  . Diabetes   Subjective    HPI  Follow up for cardiology visit on 04/22/2020 Dr. Rockey Situ  The patient was last seen for this 4 weeks ago. Changes made at last visit include take Losartan in the morning, metoprolol and Amlodipine in the evening. Start Cardura '1mg'$  up to '2mg'$  in the morning for elevated blood pressure. Patient advised to call [employer] to see if she qualifies for blood pressure cuff at home.  She reports excellent compliance with treatment. She feels that condition is Improved. She is not having side effects.   BP Readings from Last 3 Encounters:  05/20/20 136/88  04/22/20 (!) 140/98  03/12/20 (!) 153/92    -----------------------------------------------------------------------------------------  Lipid/Cholesterol, Follow-up  Last lipid panel Other pertinent labs  Lab Results  Component Value Date   CHOL 326 (H) 03/19/2019   HDL 67 03/19/2019   LDLCALC 238 (H) 03/19/2019   TRIG 121 03/19/2019   CHOLHDL 4.9 (H) 03/19/2019   Lab Results  Component Value Date   ALT 12 03/19/2019   AST 14 03/19/2019   PLT 213 06/18/2019   TSH 1.320 03/03/2017     She was last seen for this 4 weeks ago by Dr. Rockey Situ. Management since that visit includes start Zetia '10mg'$  daily.  She reports excellent compliance with treatment. She is not having side effects.  Reports myalgias with zetia also. Never tried coq10 Taking zetia 2x/wk currently   Diabetes Mellitus Type II, Follow-up  Lab Results  Component Value Date   HGBA1C 6.0 (A) 05/20/2020   HGBA1C 6.0 (A) 02/20/2020   HGBA1C 6.6 (A) 09/21/2019   Wt Readings from Last 3 Encounters:  05/20/20 189 lb 11.2 oz (86 kg)  04/22/20 186 lb (84.4 kg)   03/12/20 183 lb (83 kg)   Last seen for diabetes 3 months ago.  Management since then includes no changes. She reports excellent compliance with treatment. She is not having side effects.  Symptoms: Yes fatigue No foot ulcerations  No appetite changes No nausea  No paresthesia of the feet  No polydipsia  No polyuria No visual disturbances   No vomiting     Home blood sugar records: not being checked  Episodes of hypoglycemia? No    Current insulin regiment: none Most Recent Eye Exam: UTD Current exercise: none Current diet habits: in general, a "healthy" diet    Pertinent Labs: Lab Results  Component Value Date   CHOL 326 (H) 03/19/2019   HDL 67 03/19/2019   LDLCALC 238 (H) 03/19/2019   TRIG 121 03/19/2019   CHOLHDL 4.9 (H) 03/19/2019   Lab Results  Component Value Date   NA 137 09/21/2019   K 4.9 09/21/2019   CREATININE 1.52 (H) 09/21/2019   GFRNONAA 33 (L) 09/21/2019   GFRAA 38 (L) 09/21/2019   GLUCOSE 100 (H) 09/21/2019     ---------------------------------------------------------------------------------------------------   Patient Active Problem List   Diagnosis Date Noted  . Myalgia due to statin 05/20/2020  . Epigastric hernia 06/21/2019  . Diastasis recti 06/21/2019  . Obesity 06/21/2019  . Bilateral inguinal hernia 06/21/2019  . Umbilical hernia without obstruction and without gangrene 06/21/2019  . CKD stage 3 due to type 2 diabetes mellitus ([place]) 06/18/2019  .  Low back pain 07/11/2015  . Acute blood loss anemia 01/25/2015  . Chronic kidney disease (CKD), stage III (moderate) ([place]) 07/31/2014  . Dysfunction of eustachian tube 05/30/2014  . Acid reflux 05/30/2014  . Burning or prickling sensation 05/30/2014  . Deficiency of vitamin B 07/05/2008  . Anemia, deficiency 06/26/2008  . Avitaminosis D 06/26/2008  . Benign hypertension with CKD (chronic kidney disease) stage III ([place]) 06/15/2008  . Hypercholesteremia 06/12/2008  . Diabetes mellitus  without complication ([place]) 99991111  . Colon, diverticulosis 06/06/2008  . Apnea, sleep 01/26/2007  . History of colon polyps 08/09/2006   Social History   Tobacco Use  . Smoking status: Never Smoker  . Smokeless tobacco: Never Used  Vaping Use  . Vaping Use: Never used  Substance Use Topics  . Alcohol use: Not Currently    Comment: occasionally 1 glass of wine  . Drug use: No   Allergies  Allergen Reactions  . Clonidine Derivatives Other (See Comments)       Medications: Outpatient Medications Prior to Visit  Medication Sig  . amLODipine (NORVASC) 5 MG tablet TAKE 1 TABLET BY MOUTH EVERY DAY  . cholecalciferol (VITAMIN D) 1000 UNITS tablet Take 1,000 Units by mouth daily. Pt taking 2000 daily  . doxazosin (CARDURA) 2 MG tablet Take 1 tablet (2 mg total) by mouth daily.  Marland Kitchen ezetimibe (ZETIA) 10 MG tablet Take 1 tablet (10 mg total) by mouth daily.  . ferrous sulfate 325 (65 FE) MG tablet TAKE 1 TABLET BY MOUTH EVERY DAY  . metFORMIN (GLUCOPHAGE) 500 MG tablet Take 1 tablet (500 mg total) by mouth daily with breakfast.  . metoprolol succinate (TOPROL-XL) 50 MG 24 hr tablet Take 1 tablet (50 mg total) by mouth daily. TAKE WITH OR IMMEDIATELY FOLLOWING A MEAL.  Marland Kitchen omeprazole (PRILOSEC) 40 MG capsule TAKE 1 CAPSULE BY MOUTH EVERY DAY  . torsemide (DEMADEX) 10 MG tablet Take 1 tablet (10 mg total) by mouth every morning.  . [DISCONTINUED] losartan (COZAAR) 100 MG tablet Take 1 tablet (100 mg total) by mouth daily.   No facility-administered medications prior to visit.    Review of Systems  Constitutional: Positive for fatigue. Negative for activity change and appetite change.  Eyes: Negative for visual disturbance.  Respiratory: Negative for chest tightness and shortness of breath.   Cardiovascular: Negative for chest pain, palpitations and leg swelling.    Last CBC Lab Results  Component Value Date   WBC 4.9 06/18/2019   HGB 11.3 06/18/2019   HCT 34.3 06/18/2019   MCV  87 06/18/2019   MCH 28.6 06/18/2019   RDW 12.7 06/18/2019   PLT 213 [id number]   Last metabolic panel Lab Results  Component Value Date   GLUCOSE 100 (H) 09/21/2019   NA 137 09/21/2019   K 4.9 09/21/2019   CL 99 09/21/2019   CO2 24 09/21/2019   BUN 29 (H) 09/21/2019   CREATININE 1.52 (H) 09/21/2019   GFRNONAA 33 (L) 09/21/2019   GFRAA 38 (L) 09/21/2019   CALCIUM 9.6 09/21/2019   PHOS 3.8 09/21/2019   PROT 7.0 03/19/2019   ALBUMIN 4.1 09/21/2019   LABGLOB 2.8 03/19/2019   AGRATIO 1.5 03/19/2019   BILITOT 0.2 03/19/2019   ALKPHOS 64 03/19/2019   AST 14 03/19/2019   ALT 12 03/19/2019   ANIONGAP 10 04/01/2018   Last hemoglobin A1c Lab Results  Component Value Date   HGBA1C 6.0 (A) 05/20/2020   Last thyroid functions Lab Results  Component Value Date  TSH 1.320 03/03/2017        Objective    BP 136/88 (BP Location: Left Arm, Patient Position: Sitting, Cuff Size: Large)   Pulse 86   Temp 98.7 F (37.1 C) (Oral)   Resp 16   Wt 189 lb 11.2 oz (86 kg)   SpO2 100%   BMI 34.70 kg/m  BP Readings from Last 3 Encounters:  05/20/20 136/88  04/22/20 (!) 140/98  03/12/20 (!) 153/92   Wt Readings from Last 3 Encounters:  05/20/20 189 lb 11.2 oz (86 kg)  04/22/20 186 lb (84.4 kg)  03/12/20 183 lb (83 kg)      Physical Exam Vitals reviewed.  Constitutional:      General: She is not in acute distress.    Appearance: Normal appearance. She is well-developed. She is not diaphoretic.  HENT:     Head: Normocephalic and atraumatic.  Eyes:     General: No scleral icterus.    Conjunctiva/sclera: Conjunctivae normal.  Neck:     Thyroid: No thyromegaly.  Cardiovascular:     Rate and Rhythm: Normal rate and regular rhythm.     Pulses: Normal pulses.     Heart sounds: Normal heart sounds. No murmur heard.   Pulmonary:     Effort: Pulmonary effort is normal. No respiratory distress.     Breath sounds: Normal breath sounds. No wheezing, rhonchi or rales.   Musculoskeletal:     Cervical back: Neck supple.     Right lower leg: No edema.     Left lower leg: No edema.  Lymphadenopathy:     Cervical: No cervical adenopathy.  Skin:    General: Skin is warm and dry.     Findings: No rash.  Neurological:     Mental Status: She is alert and oriented to person, place, and time. Mental status is at baseline.  Psychiatric:        Mood and Affect: Mood normal.        Behavior: Behavior normal.     Results for orders placed or performed in visit on 05/20/20  POCT glycosylated hemoglobin (Hb A1C)  Result Value Ref Range   Hemoglobin A1C 6.0 (A) 4.0 - 5.6 %   Est. average glucose Bld gHb Est-mCnc 126     Assessment & Plan     Problem List Items Addressed This Visit      Cardiovascular and Mediastinum   Benign hypertension with CKD (chronic kidney disease) stage III ([place]) - Primary    Well controlled Continue current medications Recheck metabolic panel F/u in 6 months       Relevant Medications   losartan (COZAAR) 100 MG tablet   Other Relevant Orders   Comprehensive metabolic panel     Endocrine   Diabetes mellitus without complication ([place])    Well controlled Continue metformin Foot exam at next visit UTD on eye exam, vaccines      Relevant Medications   losartan (COZAAR) 100 MG tablet   Other Relevant Orders   POCT glycosylated hemoglobin (Hb A1C) (Completed)   Comprehensive metabolic panel   CKD stage 3 due to type 2 diabetes mellitus ([facility])   Relevant Medications   losartan (COZAAR) 100 MG tablet   Other Relevant Orders   Comprehensive metabolic panel     Genitourinary   Chronic kidney disease (CKD), stage III (moderate) ([facility])    Discussed what this means Avoid NSAIDs Recheck Cr        Other   Hypercholesteremia  As above - myalgias with statins and zetia Will try CoQ10 as below Continue 2x weekly zetia Recheck CMP and lipids      Relevant Medications   losartan (COZAAR) 100 MG tablet   Other  Relevant Orders   Comprehensive metabolic panel   Lipid panel   Avitaminosis D    Continue supplement Recheck level      Relevant Orders   VITAMIN D 25 Hydroxy (Vit-D Deficiency, Fractures)   Obesity    Discussed importance of healthy weight management Discussed diet and exercise       Myalgia due to statin    No longer on statins Trial of coq10       Other Visit Diagnoses    Primary hypertension       Relevant Medications   losartan (COZAAR) 100 MG tablet       Return in about 6 months (around 11/19/2020) for chronic disease f/u.      I, Lavon Paganini, MD, have reviewed all documentation for this visit. The documentation on 05/20/20 for the exam, diagnosis, procedures, and orders are all accurate and complete.   Michi Herrmann, Dionne Bucy, MD, MPH [facility]

## 2020-05-15 NOTE — Patient Instructions (Addendum)
Try CoQ10 100-'200mg'$  daily for the muscle aches with the cholesterol medicine   Hypertension, Adult High blood pressure (hypertension) is when the force of blood pumping through the arteries is too strong. The arteries are the blood vessels that carry blood from the heart throughout the body. Hypertension forces the heart to work harder to pump blood and may cause arteries to become narrow or stiff. Untreated or uncontrolled hypertension can cause a heart attack, heart failure, a stroke, kidney disease, and other problems. A blood pressure reading consists of a higher number over a lower number. Ideally, your blood pressure should be below 120/80. The first ("top") number is called the systolic pressure. It is a measure of the pressure in your arteries as your heart beats. The second ("bottom") number is called the diastolic pressure. It is a measure of the pressure in your arteries as the heart relaxes. What are the causes? The exact cause of this condition is not known. There are some conditions that result in or are related to high blood pressure. What increases the risk? Some risk factors for high blood pressure are under your control. The following factors may make you more likely to develop this condition:  Smoking.  Having type 2 diabetes mellitus, high cholesterol, or both.  Not getting enough exercise or physical activity.  Being overweight.  Having too much fat, sugar, calories, or salt (sodium) in your diet.  Drinking too much alcohol. Some risk factors for high blood pressure may be difficult or impossible to change. Some of these factors include:  Having chronic kidney disease.  Having a family history of high blood pressure.  Age. Risk increases with age.  Race. You may be at higher risk if you are African American.  Gender. Men are at higher risk than women before age 62. After age 65, women are at higher risk than men.  Having obstructive sleep apnea.  Stress. What  are the signs or symptoms? High blood pressure may not cause symptoms. Very high blood pressure (hypertensive crisis) may cause:  Headache.  Anxiety.  Shortness of breath.  Nosebleed.  Nausea and vomiting.  Vision changes.  Severe chest pain.  Seizures. How is this diagnosed? This condition is diagnosed by measuring your blood pressure while you are seated, with your arm resting on a flat surface, your legs uncrossed, and your feet flat on the floor. The cuff of the blood pressure monitor will be placed directly against the skin of your upper arm at the level of your heart. It should be measured at least twice using the same arm. Certain conditions can cause a difference in blood pressure between your right and left arms. Certain factors can cause blood pressure readings to be lower or higher than normal for a short period of time:  When your blood pressure is higher when you are in a health care provider's office than when you are at home, this is called white coat hypertension. Most people with this condition do not need medicines.  When your blood pressure is higher at home than when you are in a health care provider's office, this is called masked hypertension. Most people with this condition may need medicines to control blood pressure. If you have a high blood pressure reading during one visit or you have normal blood pressure with other risk factors, you may be asked to:  Return on a different day to have your blood pressure checked again.  Monitor your blood pressure at home for 1 week  or longer. If you are diagnosed with hypertension, you may have other blood or imaging tests to help your health care provider understand your overall risk for other conditions. How is this treated? This condition is treated by making healthy lifestyle changes, such as eating healthy foods, exercising more, and reducing your alcohol intake. Your health care provider may prescribe medicine if  lifestyle changes are not enough to get your blood pressure under control, and if:  Your systolic blood pressure is above 130.  Your diastolic blood pressure is above 80. Your personal target blood pressure may vary depending on your medical conditions, your age, and other factors. Follow these instructions at home: Eating and drinking  Eat a diet that is high in fiber and potassium, and low in sodium, added sugar, and fat. An example eating plan is called the DASH (Dietary Approaches to Stop Hypertension) diet. To eat this way: ? Eat plenty of fresh fruits and vegetables. Try to fill one half of your plate at each meal with fruits and vegetables. ? Eat whole grains, such as whole-wheat pasta, brown rice, or whole-grain bread. Fill about one fourth of your plate with whole grains. ? Eat or drink low-fat dairy products, such as skim milk or low-fat yogurt. ? Avoid fatty cuts of meat, processed or cured meats, and poultry with skin. Fill about one fourth of your plate with lean proteins, such as fish, chicken without skin, beans, eggs, or tofu. ? Avoid pre-made and processed foods. These tend to be higher in sodium, added sugar, and fat.  Reduce your daily sodium intake. Most people with hypertension should eat less than 1,500 mg of sodium a day.  Do not drink alcohol if: ? Your health care provider tells you not to drink. ? You are pregnant, may be pregnant, or are planning to become pregnant.  If you drink alcohol: ? Limit how much you use to:  0-1 drink a day for women.  0-2 drinks a day for men. ? Be aware of how much alcohol is in your drink. In the [place] one drink equals one 12 oz bottle of beer (355 mL), one 5 oz glass of wine (148 mL), or one 1 oz glass of hard liquor (44 mL).   Lifestyle  Work with your health care provider to maintain a healthy body weight or to lose weight. Ask what an ideal weight is for you.  Get at least 30 minutes of exercise most days of the week.  Activities may include walking, swimming, or biking.  Include exercise to strengthen your muscles (resistance exercise), such as Pilates or lifting weights, as part of your weekly exercise routine. Try to do these types of exercises for 30 minutes at least 3 days a week.  Do not use any products that contain nicotine or tobacco, such as cigarettes, e-cigarettes, and chewing tobacco. If you need help quitting, ask your health care provider.  Monitor your blood pressure at home as told by your health care provider.  Keep all follow-up visits as told by your health care provider. This is important.   Medicines  Take over-the-counter and prescription medicines only as told by your health care provider. Follow directions carefully. Blood pressure medicines must be taken as prescribed.  Do not skip doses of blood pressure medicine. Doing this puts you at risk for problems and can make the medicine less effective.  Ask your health care provider about side effects or reactions to medicines that you should watch for. Contact a  health care provider if you:  Think you are having a reaction to a medicine you are taking.  Have headaches that keep coming back (recurring).  Feel dizzy.  Have swelling in your ankles.  Have trouble with your vision. Get help right away if you:  Develop a severe headache or confusion.  Have unusual weakness or numbness.  Feel faint.  Have severe pain in your chest or abdomen.  Vomit repeatedly.  Have trouble breathing. Summary  Hypertension is when the force of blood pumping through your arteries is too strong. If this condition is not controlled, it may put you at risk for serious complications.  Your personal target blood pressure may vary depending on your medical conditions, your age, and other factors. For most people, a normal blood pressure is less than 120/80.  Hypertension is treated with lifestyle changes, medicines, or a combination of both.  Lifestyle changes include losing weight, eating a healthy, low-sodium diet, exercising more, and limiting alcohol. This information is not intended to replace advice given to you by your health care provider. Make sure you discuss any questions you have with your health care provider. Document Revised: 09/21/2017 Document Reviewed: 09/21/2017 Elsevier Patient Education  2021 [employer].

## 2020-05-16 ENCOUNTER — Other Ambulatory Visit: Payer: Self-pay | Admitting: Physician Assistant

## 2020-05-16 DIAGNOSIS — I129 Hypertensive chronic kidney disease with stage 1 through stage 4 chronic kidney disease, or unspecified chronic kidney disease: Secondary | ICD-10-CM

## 2020-05-17 ENCOUNTER — Other Ambulatory Visit: Payer: Self-pay | Admitting: Physician Assistant

## 2020-05-17 DIAGNOSIS — I10 Essential (primary) hypertension: Secondary | ICD-10-CM

## 2020-05-19 NOTE — Telephone Encounter (Signed)
  Notes to clinic:  Patient has appointment tomorrow    Requested Prescriptions  Pending Prescriptions Disp Refills   losartan (COZAAR) 100 MG tablet [Pharmacy Med Name: LOSARTAN POTASSIUM 100 MG TAB] 90 tablet 0    Sig: TAKE 1 TABLET BY MOUTH EVERY DAY      Cardiovascular:  Angiotensin Receptor Blockers Failed - 05/17/2020 12:45 AM      Failed - Cr in normal range and within 180 days    Creatinine, Ser  Date Value Ref Range Status  09/21/2019 1.52 (H) 0.57 - 1.00 mg/dL Final          Failed - K in normal range and within 180 days    Potassium  Date Value Ref Range Status  09/21/2019 4.9 3.5 - 5.2 mmol/L Final          Failed - Last BP in normal range    BP Readings from Last 1 Encounters:  04/22/20 (!) 140/98          Passed - Patient is not pregnant      Passed - Valid encounter within last 6 months    Recent Outpatient Visits           2 months ago Resistant hypertension   [place], [place], PA-C   2 months ago Type 2 diabetes mellitus with stage 4 chronic kidney disease, without long-term current use of insulin [facility])   [place], [place], PA-C   8 months ago Type 2 diabetes mellitus with stage 4 chronic kidney disease, without long-term current use of insulin [facility])   [place], [place], [place]   11 months ago Diabetes mellitus without complication [facility])   [facility] [place], [place], PA-C   1 year ago CKD stage 3 due to type 2 diabetes mellitus [facility])   [place], Clearnce [place], [place]       Future Appointments             Tomorrow Bacigalupo, Dionne Bucy, MD [facility], [place]

## 2020-05-19 NOTE — Telephone Encounter (Signed)
  Notes to clinic: Patient has appointment on 05/20/2020   Requested Prescriptions  Pending Prescriptions Disp Refills   metoprolol succinate (TOPROL-XL) 50 MG 24 hr tablet [Pharmacy Med Name: METOPROLOL SUCC ER 50 MG TAB] 90 tablet 0    Sig: TAKE 1 TABLET BY MOUTH DAILY. TAKE WITH OR IMMEDIATELY FOLLOWING A MEAL.      Cardiovascular:  Beta Blockers Failed - 05/16/2020  1:26 AM      Failed - Last BP in normal range    BP Readings from Last 1 Encounters:  04/22/20 (!) 140/98          Passed - Last Heart Rate in normal range    Pulse Readings from Last 1 Encounters:  04/22/20 72          Passed - Valid encounter within last 6 months    Recent Outpatient Visits           2 months ago Resistant hypertension   [place], [place], PA-C   2 months ago Type 2 diabetes mellitus with stage 4 chronic kidney disease, without long-term current use of insulin [facility])   [facility] [place], [place], PA-C   8 months ago Type 2 diabetes mellitus with stage 4 chronic kidney disease, without long-term current use of insulin [facility])   [place], [place]   11 months ago Diabetes mellitus without complication [facility])   [facility] [place], [place], PA-C   1 year ago CKD stage 3 due to type 2 diabetes mellitus [facility])   [place], Clearnce [place], [place]       Future Appointments             Tomorrow Bacigalupo, Dionne Bucy, MD [facility], [place]

## 2020-05-20 ENCOUNTER — Ambulatory Visit (INDEPENDENT_AMBULATORY_CARE_PROVIDER_SITE_OTHER): Payer: Medicare Other | Admitting: Family Medicine

## 2020-05-20 ENCOUNTER — Encounter: Payer: Self-pay | Admitting: Family Medicine

## 2020-05-20 ENCOUNTER — Other Ambulatory Visit: Payer: Self-pay

## 2020-05-20 VITALS — BP 136/88 | HR 86 | Temp 98.7°F | Resp 16 | Wt 189.7 lb

## 2020-05-20 DIAGNOSIS — N183 Chronic kidney disease, stage 3 unspecified: Secondary | ICD-10-CM

## 2020-05-20 DIAGNOSIS — E669 Obesity, unspecified: Secondary | ICD-10-CM

## 2020-05-20 DIAGNOSIS — E119 Type 2 diabetes mellitus without complications: Secondary | ICD-10-CM

## 2020-05-20 DIAGNOSIS — E559 Vitamin D deficiency, unspecified: Secondary | ICD-10-CM

## 2020-05-20 DIAGNOSIS — Z6834 Body mass index (BMI) 34.0-34.9, adult: Secondary | ICD-10-CM

## 2020-05-20 DIAGNOSIS — I10 Essential (primary) hypertension: Secondary | ICD-10-CM

## 2020-05-20 DIAGNOSIS — I129 Hypertensive chronic kidney disease with stage 1 through stage 4 chronic kidney disease, or unspecified chronic kidney disease: Secondary | ICD-10-CM | POA: Diagnosis not present

## 2020-05-20 DIAGNOSIS — N1832 Chronic kidney disease, stage 3b: Secondary | ICD-10-CM

## 2020-05-20 DIAGNOSIS — E1122 Type 2 diabetes mellitus with diabetic chronic kidney disease: Secondary | ICD-10-CM

## 2020-05-20 DIAGNOSIS — M791 Myalgia, unspecified site: Secondary | ICD-10-CM

## 2020-05-20 DIAGNOSIS — E7800 Pure hypercholesterolemia, unspecified: Secondary | ICD-10-CM

## 2020-05-20 DIAGNOSIS — T466X5A Adverse effect of antihyperlipidemic and antiarteriosclerotic drugs, initial encounter: Secondary | ICD-10-CM

## 2020-05-20 LAB — POCT GLYCOSYLATED HEMOGLOBIN (HGB A1C)
Est. average glucose Bld gHb Est-mCnc: 126
Hemoglobin A1C: 6 % — AB (ref 4.0–5.6)

## 2020-05-20 MED ORDER — LOSARTAN POTASSIUM 100 MG PO TABS: 1.0000 | ORAL_TABLET | Freq: Every day | ORAL | 1 refills | Status: DC

## 2020-05-20 NOTE — Assessment & Plan Note (Signed)
Well controlled Continue current medications Recheck metabolic panel F/u in 6 months  

## 2020-05-20 NOTE — Assessment & Plan Note (Signed)
Discussed importance of healthy weight management Discussed diet and exercise  

## 2020-05-20 NOTE — Assessment & Plan Note (Signed)
Continue supplement Recheck level 

## 2020-05-20 NOTE — Assessment & Plan Note (Signed)
No longer on statins Trial of coq10

## 2020-05-20 NOTE — Assessment & Plan Note (Signed)
As above - myalgias with statins and zetia Will try CoQ10 as below Continue 2x weekly zetia Recheck CMP and lipids

## 2020-05-20 NOTE — Assessment & Plan Note (Signed)
Discussed what this means Avoid NSAIDs Recheck Cr

## 2020-05-20 NOTE — Assessment & Plan Note (Signed)
Well controlled Continue metformin Foot exam at next visit UTD on eye exam, vaccines

## 2020-05-21 LAB — COMPREHENSIVE METABOLIC PANEL
ALT: 23 IU/L (ref 0–32)
AST: 19 IU/L (ref 0–40)
Albumin/Globulin Ratio: 1.6 (ref 1.2–2.2)
Albumin: 4.1 g/dL (ref 3.7–4.7)
Alkaline Phosphatase: 65 IU/L (ref 44–121)
BUN/Creatinine Ratio: 16 (ref 12–28)
BUN: 28 mg/dL — ABNORMAL HIGH (ref 8–27)
Bilirubin Total: <0.2 mg/dL (ref 0.0–1.2)
CO2: 22 mmol/L (ref 20–29)
Calcium: 9.4 mg/dL (ref 8.7–10.3)
Chloride: 102 mmol/L (ref 96–106)
Creatinine, Ser: 1.76 mg/dL — ABNORMAL HIGH (ref 0.57–1.00)
Globulin, Total: 2.6 g/dL (ref 1.5–4.5)
Glucose: 127 mg/dL — ABNORMAL HIGH (ref 65–99)
Potassium: 4.9 mmol/L (ref 3.5–5.2)
Sodium: 137 mmol/L (ref 134–144)
Total Protein: 6.7 g/dL (ref 6.0–8.5)
eGFR: 29 mL/min/{1.73_m2} — ABNORMAL LOW (ref >59)

## 2020-05-21 LAB — LIPID PANEL
Chol/HDL Ratio: 3.2 ratio (ref 0.0–4.4)
Cholesterol, Total: 236 mg/dL — ABNORMAL HIGH (ref 100–199)
HDL: 74 mg/dL (ref >39)
LDL Chol Calc (NIH): 143 mg/dL — ABNORMAL HIGH (ref 0–99)
Triglycerides: 107 mg/dL (ref 0–149)
VLDL Cholesterol Cal: 19 mg/dL (ref 5–40)

## 2020-05-21 LAB — VITAMIN D 25 HYDROXY (VIT D DEFICIENCY, FRACTURES): Vit D, 25-Hydroxy: 34.8 ng/mL (ref 30.0–100.0)

## 2020-05-26 ENCOUNTER — Telehealth: Payer: Self-pay | Admitting: Physician Assistant

## 2020-05-26 DIAGNOSIS — Z1231 Encounter for screening mammogram for malignant neoplasm of breast: Secondary | ICD-10-CM

## 2020-05-26 NOTE — Telephone Encounter (Signed)
Pt is requesting that an order be sent to [facility] so she can schedule her yearly mammogram at [facility]

## 2020-05-26 NOTE — Telephone Encounter (Signed)
Was unable to reach patient, order has been placed. KW

## 2020-06-06 ENCOUNTER — Other Ambulatory Visit: Payer: Self-pay | Admitting: Family Medicine

## 2020-06-06 DIAGNOSIS — Z1231 Encounter for screening mammogram for malignant neoplasm of breast: Secondary | ICD-10-CM

## 2020-06-12 ENCOUNTER — Ambulatory Visit
Admission: RE | Admit: 2020-06-12 | Discharge: 2020-06-12 | Disposition: A | Discharge status: Home / Self Care | Payer: Medicare Other | Source: Ambulatory Visit | Attending: Family Medicine | Admitting: Family Medicine

## 2020-06-12 ENCOUNTER — Other Ambulatory Visit: Payer: Self-pay

## 2020-06-12 DIAGNOSIS — Z1231 Encounter for screening mammogram for malignant neoplasm of breast: Secondary | ICD-10-CM | POA: Insufficient documentation

## 2020-07-16 LAB — COMPREHENSIVE METABOLIC PANEL
Calcium: 9.1 (ref 8.7–10.7)
GFR calc Af Amer: 43

## 2020-07-16 LAB — CBC: RBC: 3.63 — AB (ref 3.87–5.11)

## 2020-07-16 LAB — CBC AND DIFFERENTIAL
HCT: 32 — AB (ref 36–46)
Hemoglobin: 10.6 — AB (ref 12.0–16.0)
Platelets: 246 (ref 150–399)

## 2020-07-16 LAB — BASIC METABOLIC PANEL
Creatinine: 1.4 — AB (ref <=1.1)
Glucose: 86
Potassium: 4.5 (ref 3.4–5.3)

## 2020-07-21 ENCOUNTER — Ambulatory Visit: Payer: Self-pay | Admitting: *Deleted

## 2020-07-21 NOTE — Telephone Encounter (Signed)
Reason for Disposition  [1] All other patients AND [2] now alert and feels fine  (Exception: SIMPLE FAINT due to stress, pain, prolonged standing, or suddenly standing)  Answer Assessment - Initial Assessment Questions 1. ONSET: "How long were you unconscious?" (minutes) "When did it happen?"     Seconds , patient was at church 2. CONTENT: "What happened during period of unconsciousness?" (e.g., seizure activity)      Patient was sitting, having pain, vomiting- shaking 3. MENTAL STATUS: "Alert and oriented now?" (oriented x 3 = name, month, location)      Alert and oriented 4. TRIGGER: "What do you think caused the fainting?" "What were you doing just before you fainted?"  (e.g., exercise, sudden standing up, prolonged standing)     Not sure- patient was tried 5. RECURRENT SYMPTOM: "Have you ever passed out before?" If Yes, ask: "When was the last time?" and "What happened that time?"      Not since she was in hospital 6. INJURY: "Did you sustain any injury during the fall?"      no 7. CARDIAC SYMPTOMS: "Have you had any of the following symptoms: chest pain, difficulty breathing, palpitations?"     no 8. NEUROLOGIC SYMPTOMS: "Have you had any of the following symptoms: headache, numbness, vertigo, weakness?"     no 9. GI SYMPTOMS: "Have you had any of the following symptoms: abdominal pain, vomiting, diarrhea, blood in stools?"     Patient has been seeing blood from her hemorrhoid recently 3. OTHER SYMPTOMS: "Do you have any other symptoms?"       After event yesterday and resting- EMS came- she refused hospital.  11. PREGNANCY: "Is there any chance you are pregnant?" "When was your last menstrual period?"       N/a  Protocols used: Fainting-A-AH

## 2020-07-21 NOTE — Telephone Encounter (Signed)
Appt scheduled. Ok per Dr B.

## 2020-07-21 NOTE — Telephone Encounter (Signed)
Patient is calling to report she was cooking for homecoming at church yesterday and she fainted. Patient states EMS came out but she did not go with them. Patient wants appointment with PCP- call to office- they are going to check with provider and call patient back.

## 2020-07-22 ENCOUNTER — Ambulatory Visit (INDEPENDENT_AMBULATORY_CARE_PROVIDER_SITE_OTHER): Payer: Medicare Other | Admitting: Family Medicine

## 2020-07-22 ENCOUNTER — Telehealth: Payer: Self-pay | Admitting: Cardiovascular Disease

## 2020-07-22 ENCOUNTER — Encounter: Payer: Self-pay | Admitting: Family Medicine

## 2020-07-22 ENCOUNTER — Other Ambulatory Visit: Payer: Self-pay

## 2020-07-22 VITALS — BP 152/80 | HR 67 | Temp 98.0°F | Resp 16 | Ht 62.0 in | Wt 187.0 lb

## 2020-07-22 DIAGNOSIS — R55 Syncope and collapse: Secondary | ICD-10-CM | POA: Diagnosis not present

## 2020-07-22 NOTE — Telephone Encounter (Signed)
Attempted to reach out to Mrs. Bertola, unable to reach via phone, unable to leave message.  Was able to try again and got Mrs. Brockschmidt on the phone, she reports was not feeling well this past Sunday. She "passed out" at [employer] and was assessed by EMS, but did not go to the ED. She called yesterday and got an appt with her PCP and she seen her today. Reports EMS EKG was NSR and BP little elevated.   Reports she feels well today, PCP order some labs (advised we will be able to see in her EMR). PCP wanted her to call Dr. Rockey Situ for an appt to see about possible ZIO and/or ECHO. Appt made for 8/2. Advised if symptoms return then call the office, sometimes we have openings from where pt cancel. Mrs. Mcnay verbalized understanding.   PCP notes in EMR for review, pt reports feeling fine today and yesterday, advised on hydration and if another episode occurs then to call 911 again for evaluation. Pt agrees to plan and will see in August, unless otherwise sooner.

## 2020-07-22 NOTE — Telephone Encounter (Signed)
Patient states she passed out at church this past Sunday. Pt c/o Syncope: STAT if syncope occurred within 30 minutes and pt complains of lightheadedness High Priority if episode of passing out, completely, today or in last 24 hours   Did you pass out today? no  When is the last time you passed out? Sunday 6/26  Has this occurred multiple times?  Not since she passed out when her blood count was low(diagnosed with diverticulitis)  Did you have any symptoms prior to passing out?  Patient states she felt "different" and then things went light and dark, and was shaking.  Patient's niece states her bottom lip was "twisted" and she vomited   Please call to discuss. Patient is scheduled for August 2

## 2020-07-22 NOTE — Progress Notes (Signed)
Established patient visit   Patient: Miranda Olsen   DOB: November 21, 1940   80 y.o. Female  MRN: [id number] Visit Date: 07/22/2020  Today's healthcare provider: Lavon Paganini, MD   Chief Complaint  Patient presents with   syncopal episode    Subjective    HPI  Syncopal episode Patient presents today for evaluation after a syncopal episode. She reports that she was cooking at church 2 days ago, and felt dizzy and passed out. She slumped over while sitting and someone caught her before she hit the ground. She remembers her niece bringing her water and then she went blank. She was unconscious for about 2-3 seconds. When she regained consciousness she was aware but didn't recall the syncopal episode. She reports that she was examined by EMS, but did not want to go to the hospital. EMS made note of her blood pressure being 126/93. In the past it was due to her low blood pressure when she had diverticulitis. She denies palpitations, chest pain, or SOB.   Cardiology Her last appointment with Dr. Arta Bruce, cardiology was in March 2022. He prescribes her 2 mg cardura for hypertension and 10 mg zetia for high cholesterol.   Back Pain  She reports pain that radiates down her back down through her leg on her right side. She believes the pain is dure to her scoliosis.       Medications: Outpatient Medications Prior to Visit  Medication Sig   amLODipine (NORVASC) 5 MG tablet TAKE 1 TABLET BY MOUTH EVERY DAY   cholecalciferol (VITAMIN D) 1000 UNITS tablet Take 1,000 Units by mouth daily. Pt taking 2000 daily   doxazosin (CARDURA) 2 MG tablet Take 1 tablet (2 mg total) by mouth daily.   ezetimibe (ZETIA) 10 MG tablet Take 1 tablet (10 mg total) by mouth daily.   ferrous sulfate 325 (65 FE) MG tablet TAKE 1 TABLET BY MOUTH EVERY DAY   losartan (COZAAR) 100 MG tablet Take 1 tablet (100 mg total) by mouth daily.   metFORMIN (GLUCOPHAGE) 500 MG tablet Take 1 tablet (500 mg total) by mouth  daily with breakfast.   metoprolol succinate (TOPROL-XL) 50 MG 24 hr tablet Take 1 tablet (50 mg total) by mouth daily. TAKE WITH OR IMMEDIATELY FOLLOWING A MEAL.   omeprazole (PRILOSEC) 40 MG capsule TAKE 1 CAPSULE BY MOUTH EVERY DAY   torsemide (DEMADEX) 10 MG tablet Take 1 tablet (10 mg total) by mouth every morning.   No facility-administered medications prior to visit.    Review of Systems  Constitutional:  Negative for activity change, appetite change, chills, fatigue and fever.  HENT:  Negative for ear pain, sinus pressure, sinus pain and sore throat.   Eyes:  Negative for pain and visual disturbance.  Respiratory:  Negative for cough, chest tightness, shortness of breath and wheezing.   Cardiovascular:  Negative for chest pain, palpitations and leg swelling.  Gastrointestinal:  Negative for abdominal pain, blood in stool, diarrhea, nausea and vomiting.  Genitourinary:  Negative for dysuria, flank pain, frequency, pelvic pain and urgency.  Musculoskeletal:  Negative for arthralgias, back pain, gait problem, myalgias and neck pain.  Neurological:  Positive for syncope. Negative for dizziness, weakness, light-headedness, numbness and headaches.      Objective    BP (!) 152/80   Pulse 67   Temp 98 F (36.7 C)   Resp 16   Ht '5\' 2"'$  (1.575 m)   Wt 187 lb (84.8 kg)   BMI 34.20  kg/m     Physical Exam Vitals reviewed.  Constitutional:      General: She is not in acute distress.    Appearance: Normal appearance. She is well-developed. She is not diaphoretic.  HENT:     Head: Normocephalic and atraumatic.  Eyes:     General: No scleral icterus.    Extraocular Movements: Extraocular movements intact.     Conjunctiva/sclera: Conjunctivae normal.  Neck:     Thyroid: No thyromegaly.  Cardiovascular:     Rate and Rhythm: Normal rate and regular rhythm.     Pulses: Normal pulses.     Heart sounds: Normal heart sounds. No murmur heard. Pulmonary:     Effort: Pulmonary effort  is normal. No respiratory distress.     Breath sounds: Normal breath sounds. No wheezing, rhonchi or rales.  Musculoskeletal:     Cervical back: Neck supple.     Right lower leg: No edema.     Left lower leg: No edema.  Lymphadenopathy:     Cervical: No cervical adenopathy.  Skin:    General: Skin is warm and dry.     Findings: No rash.  Neurological:     Mental Status: She is alert and oriented to person, place, and time. Mental status is at baseline.     Cranial Nerves: No cranial nerve deficit.     Sensory: No sensory deficit.     Motor: No weakness.  Psychiatric:        Mood and Affect: Mood normal.        Behavior: Behavior normal.     No results found for any visits on 07/22/20.  Assessment & Plan     Problem List Items Addressed This Visit   None   No follow-ups on file.      I,Essence Turner,acting as a Education administrator for Lavon Paganini, MD.,have documented all relevant documentation on the behalf of Lavon Paganini, MD,as directed by  Lavon Paganini, MD while in the presence of Lavon Paganini, MD.  I, Lavon Paganini, MD, have reviewed all documentation for this visit. The documentation on 07/22/20 for the exam, diagnosis, procedures, and orders are all accurate and complete.   Kindell Strada, Dionne Bucy, MD, MPH [facility]

## 2020-07-22 NOTE — Assessment & Plan Note (Signed)
Feeling well today after single episode Discussed large differential diagnosis No seizure like activity and not postictal Orthostatics normal today and BP even elevated after episode per EMS No vertigo and neuro intact on exam EKG from EMS is sinus rhythm, but concern for occult arrhythmia vs valvular issue (no murmur on exam today) Consider Zio and Echo - but will let her f/u with her Cardiologist to discuss Check CBC and TSH Recent CMP by Nephrology Return/emergent precautions discussed

## 2020-07-22 NOTE — Patient Instructions (Signed)
Call Dr Donivan Scull office for next available appointment  [phone]

## 2020-07-23 LAB — CBC WITH DIFFERENTIAL/PLATELET
Basophils Absolute: 0 10*3/uL (ref 0.0–0.2)
Basos: 0 %
EOS (ABSOLUTE): 0.1 10*3/uL (ref 0.0–0.4)
Eos: 2 %
Hematocrit: 31.6 % — ABNORMAL LOW (ref 34.0–46.6)
Hemoglobin: 10.4 g/dL — ABNORMAL LOW (ref 11.1–15.9)
Immature Grans (Abs): 0 10*3/uL (ref 0.0–0.1)
Immature Granulocytes: 0 %
Lymphocytes Absolute: 1.2 10*3/uL (ref 0.7–3.1)
Lymphs: 25 %
MCH: 29.1 pg (ref 26.6–33.0)
MCHC: 32.9 g/dL (ref 31.5–35.7)
MCV: 89 fL (ref 79–97)
Monocytes Absolute: 0.4 10*3/uL (ref 0.1–0.9)
Monocytes: 9 %
Neutrophils Absolute: 3.1 10*3/uL (ref 1.4–7.0)
Neutrophils: 64 %
Platelets: 250 10*3/uL (ref 150–450)
RBC: 3.57 x10E6/uL — ABNORMAL LOW (ref 3.77–5.28)
RDW: 12.5 % (ref 11.7–15.4)
WBC: 4.9 10*3/uL (ref 3.4–10.8)

## 2020-07-23 LAB — TSH: TSH: 0.999 u[IU]/mL (ref 0.450–4.500)

## 2020-08-25 NOTE — Progress Notes (Signed)
Cardiology Office Note  Date:  08/26/2020   ID:  Miranda, Olsen Apr 03, 1940, MRN [id number]  PCP:  Virginia Crews, MD   Chief Complaint  Patient presents with   syncope     Patient had a spell of syncope in June 2022; has not had anymore spells since. Medications reviewed by the patient verbally.     HPI:  [name] is a 80 year old woman with past medical history of Hypertension Hyperlipidemia Chronic kidney disease , GFR greater than 50 Diabetes Scoliosis Who presents for hypertension management, recent episode of syncope  In follow-up today she reports her blood pressure is doing much better Typically running [id number] systolic Losartan torsemide and cardura in the Am pm takes metoprolol amlodipine  1 episode of syncope 06/2020 Was taking care of her granddaughter, granddaughter was up all night she did not sleep very well [name] was exhausted that morning, did not get anything to eat or drink, took her morning medications Reports that church was hot, was back with the children, sitting Felt very tired, started developing discomfort in her shoulders all over  Next thing she knows, she she was out for several seconds and does not remember what happens EMTs come out, blood pressure EKG normal did not want to go to the hospital. blood pressure being 126/93. In retrospect she does report taking medications on an empty stomach " Never got to drink my coffee"  Since then no further episodes, denies any orthostasis symptoms Blood pressure typically 130  Stressors, son passed away 2019-12-05  Orthostatics checked today minimal drop in blood pressure 136 down to 123 with standing good recovery after 3 minutes up to [id number] systolic  EKG personally reviewed by myself on todays visit Shows normal sinus rhythm with rate 68 bpm no significant ST or T wave changes  Other past medical history reviewed CT scan abdomen pelvis March 2020 images pulled up and  reviewed Small to moderate sized hiatal hernia, umbilical hernia, Tortuous aorta Minimal aortic atherosclerosis  Family history Mother with high blood pressure   PMH:   has a past medical history of Diabetes mellitus without complication ([place]), Hypercholesterolemia, and Hypertension.  PSH:    Past Surgical History:  Procedure Laterality Date   COLONOSCOPY WITH PROPOFOL N/A 01/27/2015   Procedure: COLONOSCOPY WITH PROPOFOL;  Surgeon: Josefine Class, MD;  Location[facility];  Service: Endoscopy;  Laterality: N/A;   EYE SURGERY Left 01/21/2009    cataract surgery    EYE SURGERY Right 04/08/2009   cataract surgery   REPLACEMENT TOTAL KNEE Left 10/08/2009   Dr. Marry Guan    Current Outpatient Medications  Medication Sig Dispense Refill   amLODipine (NORVASC) 5 MG tablet TAKE 1 TABLET BY MOUTH EVERY DAY 90 tablet 3   cholecalciferol (VITAMIN D) 1000 UNITS tablet Take 1,000 Units by mouth daily. Pt taking 2000 daily     Coenzyme Q10 (CO Q-10) 200 MG CAPS Take 200 mg by mouth daily.     doxazosin (CARDURA) 2 MG tablet Take 1 tablet (2 mg total) by mouth daily. 90 tablet 2   ezetimibe (ZETIA) 10 MG tablet Take 1 tablet (10 mg total) by mouth daily. 90 tablet 3   ferrous sulfate 325 (65 FE) MG tablet TAKE 1 TABLET BY MOUTH EVERY DAY 90 tablet 0   losartan (COZAAR) 100 MG tablet Take 1 tablet (100 mg total) by mouth daily. 90 tablet 1   metFORMIN (GLUCOPHAGE) 500 MG tablet Take 1 tablet (500 mg  total) by mouth daily with breakfast. 90 tablet 3   metoprolol succinate (TOPROL-XL) 50 MG 24 hr tablet Take 1 tablet (50 mg total) by mouth daily. TAKE WITH OR IMMEDIATELY FOLLOWING A MEAL. 90 tablet 1   omeprazole (PRILOSEC) 40 MG capsule TAKE 1 CAPSULE BY MOUTH EVERY DAY 90 capsule 1   torsemide (DEMADEX) 10 MG tablet Take 1 tablet (10 mg total) by mouth every morning. 90 tablet 3   No current facility-administered medications for this visit.    Allergies:   Clonidine derivatives    Social History:  The patient  reports that she has never smoked. She has never used smokeless tobacco. She reports previous alcohol use. She reports that she does not use drugs.   Family History:   family history includes Breast cancer (age of onset: 60) in an other family member; Breast cancer (age of onset: 60) in her sister; Breast cancer (age of onset: 75) in her maternal aunt; Cancer in her sister; Congestive Heart Failure in her sister and sister; Leukemia in her father; Lung cancer in her brother; Parkinsonism in her mother.    Review of Systems: Review of Systems  Constitutional: Negative.   HENT: Negative.    Respiratory: Negative.    Cardiovascular: Negative.   Gastrointestinal: Negative.   Musculoskeletal:  Positive for back pain.  Neurological: Negative.   Psychiatric/Behavioral: Negative.    All other systems reviewed and are negative.  PHYSICAL EXAM: VS:  BP 140/82 (BP Location: Left Arm, Patient Position: Sitting, Cuff Size: Normal)   Pulse 68   Ht '5\' 2"'$  (1.575 m)   Wt 189 lb 4 oz (85.8 kg)   SpO2 99%   BMI 34.61 kg/m  , BMI Body mass index is 34.61 kg/m. Constitutional:  oriented to person, place, and time. No distress.  HENT:  Head: Grossly normal Eyes:  no discharge. No scleral icterus.  Neck: No JVD, no carotid bruits  Cardiovascular: Regular rate and rhythm, no murmurs appreciated Pulmonary/Chest: Clear to auscultation bilaterally, no wheezes or rails Abdominal: Soft.  no distension.  no tenderness.  Musculoskeletal: Normal range of motion Neurological:  normal muscle tone. Coordination normal. No atrophy Skin: Skin warm and dry Psychiatric: normal affect, pleasant   Recent Labs: 05/20/2020: ALT 23; BUN 28; Sodium 137 07/16/2020: Creatinine 1.4; Potassium 4.5 07/22/2020: Hemoglobin 10.4; Platelets 250; TSH 0.999    Lipid Panel Lab Results  Component Value Date   CHOL 236 (H) 05/20/2020   HDL 74 05/20/2020   LDLCALC 143 (H) 05/20/2020   TRIG 107  05/20/2020      Wt Readings from Last 3 Encounters:  08/26/20 189 lb 4 oz (85.8 kg)  07/22/20 187 lb (84.8 kg)  05/20/20 189 lb 11.2 oz (86 kg)       ASSESSMENT AND PLAN:  Problem List Items Addressed This Visit       Cardiology Problems   Benign hypertension with CKD (chronic kidney disease) stage III ([place]) - Primary   Hypercholesteremia     Other   Diabetes mellitus without complication ([place])  Essential hypertension Blood pressure is well controlled on today's visit. No changes made to the medications. Recent episode of syncope likely from being up all night, no breakfast in the morning, no fluids, took medications on an empty stomach in the hot church Discussed that she should not take her morning medications unless she has had breakfast with some beverage Recommend she call us back for any near-syncope or syncope or low blood pressures  Hyperlipidemia  Reports having difficulty tolerating Crestor, takes it sparingly Minimal aortic atherosclerosis on CT scan Now on zetia  Obesity We have encouraged continued exercise, careful diet management in an effort to lose weight.  Diabetes type 2 A1c steady 6.6 Doing well   Total encounter time more than 25 minutes  Greater than 50% was spent in counseling and coordination of care with the patient    Signed, Esmond Plants, M.D., Ph.D. [place], [place]

## 2020-08-26 ENCOUNTER — Encounter: Payer: Self-pay | Admitting: Cardiovascular Disease

## 2020-08-26 ENCOUNTER — Ambulatory Visit (INDEPENDENT_AMBULATORY_CARE_PROVIDER_SITE_OTHER): Payer: Medicare Other | Admitting: Cardiovascular Disease

## 2020-08-26 ENCOUNTER — Other Ambulatory Visit: Payer: Self-pay

## 2020-08-26 VITALS — BP 140/82 | HR 68 | Ht 62.0 in | Wt 189.2 lb

## 2020-08-26 DIAGNOSIS — I10 Essential (primary) hypertension: Secondary | ICD-10-CM

## 2020-08-26 DIAGNOSIS — I129 Hypertensive chronic kidney disease with stage 1 through stage 4 chronic kidney disease, or unspecified chronic kidney disease: Secondary | ICD-10-CM

## 2020-08-26 DIAGNOSIS — E7800 Pure hypercholesterolemia, unspecified: Secondary | ICD-10-CM | POA: Diagnosis not present

## 2020-08-26 DIAGNOSIS — N183 Chronic kidney disease, stage 3 unspecified: Secondary | ICD-10-CM | POA: Diagnosis not present

## 2020-08-26 DIAGNOSIS — E119 Type 2 diabetes mellitus without complications: Secondary | ICD-10-CM | POA: Diagnosis not present

## 2020-08-26 MED ORDER — METOPROLOL SUCCINATE ER 50 MG PO TB24: 50.0000 mg | ORAL_TABLET | Freq: Every day | ORAL | 3 refills | Status: DC

## 2020-08-26 MED ORDER — EZETIMIBE 10 MG PO TABS: 10.0000 mg | ORAL_TABLET | Freq: Every day | ORAL | 3 refills | Status: DC

## 2020-08-26 MED ORDER — LOSARTAN POTASSIUM 100 MG PO TABS: 100.0000 mg | ORAL_TABLET | Freq: Every day | ORAL | 3 refills | Status: DC

## 2020-08-26 MED ORDER — DOXAZOSIN MESYLATE 2 MG PO TABS: 2.0000 mg | ORAL_TABLET | Freq: Every day | ORAL | 3 refills | Status: DC

## 2020-08-26 MED ORDER — TORSEMIDE 10 MG PO TABS: 10.0000 mg | ORAL_TABLET | ORAL | 3 refills | Status: DC

## 2020-08-26 MED ORDER — AMLODIPINE BESYLATE 5 MG PO TABS: 5.0000 mg | ORAL_TABLET | Freq: Every day | ORAL | 3 refills | Status: DC

## 2020-08-26 NOTE — Patient Instructions (Addendum)
Medication Instructions:  No changes  If you need a refill on your cardiac medications before your next appointment, please call your pharmacy.   Lab work: No new labs needed  Testing/Procedures: No new testing needed  Follow-Up: At [facility], you and your health needs are our priority.  As part of our continuing mission to provide you with exceptional heart care, we have created designated Provider Care Teams.  These Care Teams include your primary Cardiologist (physician) and Advanced Practice Providers (APPs -  Physician Assistants and Nurse Practitioners) who all work together to provide you with the care you need, when you need it.  You will need a follow up appointment in 12 months  Providers on your designated Care Team:   Christopher Berge, NP Ryan Dunn, PA-C Jacquelyn Visser, PA-C Cadence Furth, PA-C  COVID-19 Vaccine Information can be found at: [email or web address] For questions related to vaccine distribution or appointments, please email [email or web address] or call [phone].    

## 2020-08-26 NOTE — Addendum Note (Signed)
Addended by: Wynema Birch on: 08/26/2020 02:01 PM   Modules accepted: Orders

## 2020-09-04 ENCOUNTER — Telehealth: Payer: Self-pay

## 2020-09-04 NOTE — Progress Notes (Signed)
Chronic Care Management Pharmacy Assistant   Name: Miranda Olsen  MRN: [id number] DOB: [birth year]  Reason for Encounter: Diabetes and Hypertension Disease State Call/Scheduled F/U with Cristie Hem.   Recent office visits:  07/22/2020 Lavon Paganini, MD (PCP Office Visit) for Syncopal Episode- No medication changes noted; labs ordered; patient instructed to follow-up in 6 months.  05/20/2020 Lavon Paganini, MD (PCP Office Visit) for Hypertension- No medication changes indicated; labs ordered; patient instructed to follow-up in 6 months  04/09/2020 Fabio Neighbors, LPN (PCP Clinical Support) for Medicare Wellness Exam- No medication changes noted  03/12/2020 Fenton Malling, PA-C (PCP Office Visit) for Hypertension- No medication changes noted- Referral to Cardiology ordered  Recent consult visits:  08/26/2020 Ida Rogue, MD (Cardiology) for Syncope- No medication changes noted- patient instructed to follow-up in 12 months  07/16/2020 Murlean Iba, MD (Nephrology) for Follow-up- Statin changed to Zetia, Patient instructed to follow-up in 4 months  04/22/2020 Ida Rogue, MD (Cardiology) for New patient initial visit- Suggested she move losartan to the morning, Metoprolol and amlodipine 2 after dinner in the evening For blood pressure we will add Cardura 1 mg up to 2 mg in the morning, Ezetimibe 10 mg daily per note.  Hospital visits:  None in previous 6 months  Medications: Outpatient Encounter Medications as of 09/04/2020  Medication Sig   amLODipine (NORVASC) 5 MG tablet Take 1 tablet (5 mg total) by mouth daily.   cholecalciferol (VITAMIN D) 1000 UNITS tablet Take 1,000 Units by mouth daily. Pt taking 2000 daily   Coenzyme Q10 (CO Q-10) 200 MG CAPS Take 200 mg by mouth daily.   doxazosin (CARDURA) 2 MG tablet Take 1 tablet (2 mg total) by mouth daily.   ezetimibe (ZETIA) 10 MG tablet Take 1 tablet (10 mg total) by mouth daily.   ferrous sulfate 325 (65 FE) MG  tablet TAKE 1 TABLET BY MOUTH EVERY DAY   losartan (COZAAR) 100 MG tablet Take 1 tablet (100 mg total) by mouth daily.   metFORMIN (GLUCOPHAGE) 500 MG tablet Take 1 tablet (500 mg total) by mouth daily with breakfast.   metoprolol succinate (TOPROL-XL) 50 MG 24 hr tablet Take 1 tablet (50 mg total) by mouth daily. TAKE WITH OR IMMEDIATELY FOLLOWING A MEAL.   omeprazole (PRILOSEC) 40 MG capsule TAKE 1 CAPSULE BY MOUTH EVERY DAY   torsemide (DEMADEX) 10 MG tablet Take 1 tablet (10 mg total) by mouth every morning.   No facility-administered encounter medications on file as of 09/04/2020.   Care Gaps: Zoster Vaccines- Shingrix FOOT EXAM (last completed 05/04/2018) COVID-19 Vaccine Booster 4 INFLUENZA VACCINE  Star Rating Drugs: Losartan 100 mg last filled on 08/17/2020 for a 90-Day supply with [facility] Metformin 500 mg last filled on 08/19/2020 for a 90-Day supply with [facility]  Recent Relevant Labs: Lab Results  Component Value Date/Time   HGBA1C 6.0 (A) 05/20/2020 01:37 PM   HGBA1C 6.0 (A) 02/20/2020 11:19 AM   HGBA1C 6.5 (H) 03/19/2019 10:53 AM   HGBA1C 6.1 (H) 05/04/2018 09:34 AM   MICROALBUR 50 08/31/2016 09:04 AM    Kidney Function Lab Results  Component Value Date/Time   CREATININE 1.4 (A) 07/16/2020 12:00 AM   CREATININE 1.76 (H) 05/20/2020 01:44 PM   CREATININE 1.52 (H) 09/21/2019 09:48 AM   GFRNONAA 33 (L) 09/21/2019 09:48 AM   GFRAA 43 07/16/2020 12:00 AM    Current antihyperglycemic regimen:  Metformin 500 mg 1 tablet daily  What recent interventions/DTPs have been made to improve glycemic  control:  Patient reports that her Metformin was reduced from a 1000 mg to 500 mg daily.  Have there been any recent hospitalizations or ED visits since last visit with CPP? No  Patient denies hypoglycemic symptoms, including Pale, Sweaty, Shaky, Hungry, Nervous/irritable, and Vision changes  Patient denies hyperglycemic symptoms, including blurry vision, excessive  thirst, fatigue, polyuria, and weakness  How often are you checking your blood sugar? Patient reports that she does not have a glucometer at home so she does not check her blood sugars. I did advise her I would have Alex send over a prescription to her pharmacy so she can start checking her numbers   What are your blood sugars ranging?  N/A as the patient does not check  During the week, how often does your blood glucose drop below 70?  Patient states she doesn't check her numbers but she hasn't had any symptoms that her sugars run that low other than a few months ago when she passed out at church but she still is not sure it was due to her blood sugar.  Are you checking your feet daily/regularly? Yes  Adherence Review: Is the patient currently on a STATIN medication? No Is the patient currently on ACE/ARB medication? Yes Does the patient have >5 day gap between last estimated fill dates? No   Reviewed chart prior to disease state call. Spoke with patient regarding BP  Recent Office Vitals: BP Readings from Last 3 Encounters:  08/26/20 140/82  07/22/20 (!) 152/80  05/20/20 136/88   Pulse Readings from Last 3 Encounters:  08/26/20 68  07/22/20 67  05/20/20 86    Wt Readings from Last 3 Encounters:  08/26/20 189 lb 4 oz (85.8 kg)  07/22/20 187 lb (84.8 kg)  05/20/20 189 lb 11.2 oz (86 kg)     Kidney Function Lab Results  Component Value Date/Time   CREATININE 1.4 (A) 07/16/2020 12:00 AM   CREATININE 1.76 (H) 05/20/2020 01:44 PM   CREATININE 1.52 (H) 09/21/2019 09:48 AM   GFRNONAA 33 (L) 09/21/2019 09:48 AM   GFRAA 43 07/16/2020 12:00 AM    BMP Latest Ref Rng & Units 07/16/2020 05/20/2020 09/21/2019  Glucose 65 - 99 mg/dL - 127(H) 100(H)  BUN 8 - 27 mg/dL - 28(H) 29(H)  Creatinine 0.5 - 1.1 1.4(A) 1.76(H) 1.52(H)  BUN/Creat Ratio 12 - 28 - 16 19  Sodium 134 - 144 mmol/L - 137 137  Potassium 3.4 - 5.3 4.5 4.9 4.9  Chloride 96 - 106 mmol/L - 102 99  CO2 20 - 29 mmol/L -  22 24  Calcium 8.7 - 10.7 9.1 9.4 9.6    Current antihypertensive regimen:  Doxazosin 2 mg 1 tablet daily Losartan 100 mg 1 tablet daily Metoprolol Succinate 50 mg 1 tablet daily  How often are you checking your Blood Pressure?  Patient has a wrist machine that she was checking with but she stated she doesn't believe that it's accurate and her Cardiologist suggested her to have a arm machine. I informed her that I would also send a message over to [facility] with the request to send the prescription to her pharmacy.  Current home BP readings: Last BP reading was at her Cardiologist office on 08/26/2020 it was 140/82  What recent interventions/DTPs have been made by any provider to improve Blood Pressure control since last CPP Visit:  Per Cardiology note it was suggested  that patient move her losartan to the morning, Metoprolol and amlodipine 2 after dinner in the  evening and she was started on Cardura 1 mg up to 2 mg in the morning,  Any recent hospitalizations or ED visits since last visit with CPP? No  What diet changes have been made to improve Blood Pressure Control?  Patient stated that she does not have a certain diet but she does try to watch her sugar intake, she doesn't eat many desserts, or pasta. She does love bread and she tries to watch her sodium and salt intakes.  What exercise is being done to improve your Blood Pressure Control?  Patient reports that she has a lot of back pain when she moves around too much so she really doesn't doe much exercise. Patient stated that she has not lost her balance and denies needing the assistance of a walker.   Adherence Review: Is the patient currently on ACE/ARB medication? Yes Does the patient have >5 day gap between last estimated fill dates? No  Patient has been scheduled for a telephone visit with [name], CPP on 10/20/2020 @ 1230 via telephone   [name], [place] Pharmacist Assistant Phone: [phone]

## 2020-09-19 LAB — HM DIABETES EYE EXAM

## 2020-09-23 ENCOUNTER — Telehealth: Payer: Self-pay | Admitting: *Deleted

## 2020-09-23 NOTE — Chronic Care Management (AMB) (Signed)
  Care Management   Note  09/23/2020 Name: [name] MRN: [id number] DOB: 1940/11/01  Miranda Olsen is a 80 y.o. year old female who is a primary care patient of Brita Romp, Dionne Bucy, MD and is actively engaged with the care management team. I reached out to Miranda Olsen by phone today to assist with re-scheduling an initial visit with the Pharmacist  Follow up plan: Unsuccessful telephone outreach attempt made. A HIPAA compliant phone message was left for the patient providing contact information and requesting a return call.   Julian Hy, [place] Management  Direct Dial: [phone]

## 2020-09-25 ENCOUNTER — Telehealth: Payer: Self-pay

## 2020-09-25 NOTE — Telephone Encounter (Signed)
Patient advised as below.  

## 2020-09-25 NOTE — Telephone Encounter (Signed)
Patient has a follow up appointment 11/20/2020 but would like to be seen for her medicare wellness.

## 2020-09-25 NOTE — Chronic Care Management (AMB) (Signed)
  Care Management   Note  09/25/2020 Name: NAAVYA MANCHESTER MRN: [id number] DOB: April 12, 1940  Oswaldo Conroy is a 80 y.o. year old female who is a primary care patient of Virginia Crews, MD and is actively engaged with the care management team. I reached out to Oswaldo Conroy by phone today to assist with re-scheduling an initial visit with the Pharmacist  Follow up plan: Telephone appointment with care management team member scheduled for: 10/31/2020  Julian Hy, [place], [place] Management  Direct Dial: [phone]

## 2020-09-25 NOTE — Telephone Encounter (Signed)
Copied from [place] [phone]. Topic: Appointment Scheduling - Scheduling Inquiry for Clinic >> Sep 24, 2020  4:23 PM Oneta Rack wrote: Reason for CRM:  Patient called with [facility] on the line and would like to know if she can schedule her medicare wellness appointment on 11/20/2020 right before or after she see Dr. B or during the appointment

## 2020-09-25 NOTE — Telephone Encounter (Signed)
She is not due. Had AWV with McKenzie in 3/22.  Please look to see if the patient is due before sending message next time

## 2020-10-20 ENCOUNTER — Telehealth: Payer: Medicare Other

## 2020-10-30 ENCOUNTER — Telehealth: Payer: Self-pay

## 2020-10-30 NOTE — Progress Notes (Signed)
    Chronic Care Management Pharmacy Assistant   Name: Miranda Olsen  MRN: [id number] DOB: [birth year]  Patient called to be reminded of her appointment with [name], CPP on 10/31/2020 '@0900'$  via telephone.  No answer on patient's home phone, and I was unable to LVM as the patient's VM has not been setup. I tried patient's cell phone, and I just received a message stating the person I dialed is unable to receive calls at this time.  Star Rating Drug: Losartan 100 mg  last filled on 08/17/2020 for a 90-Day supply with [facility] Metformin 500 mg last filled on 09/12/2020 for a 90-Day supply with [facility]  Any gaps in medications fill history? No  Care Gaps: Zoster Vaccines Diabetic Foot Exam COVID-19 Booster 4 Influenza Vaccine Blood Pressure > 140/90  [name], CPA/CMA Clinical Pharmacist Assistant Phone: [phone]

## 2020-10-31 ENCOUNTER — Ambulatory Visit (INDEPENDENT_AMBULATORY_CARE_PROVIDER_SITE_OTHER): Payer: Medicare Other

## 2020-10-31 DIAGNOSIS — E119 Type 2 diabetes mellitus without complications: Secondary | ICD-10-CM

## 2020-10-31 DIAGNOSIS — I10 Essential (primary) hypertension: Secondary | ICD-10-CM

## 2020-10-31 MED ORDER — BLOOD PRESSURE KIT: PACK | 0 refills | Status: DC

## 2020-10-31 NOTE — Progress Notes (Addendum)
Chronic Care Management Pharmacy Note  10/31/2020 Name:  Miranda Olsen MRN:  [id number] DOB:  December 14, 1940  Summary: Patient presents for CCM follow-up. This is my first visit speaking with her. She had to leave for breakfast so full medication reconciliation not performed. She is not monitoring her blood pressure at home.   Recommendations/Changes made from today's visit: Begin monitoring blood pressure twice weekly BP kit sent into patient pharmacy  Continue current medications  Plan: CPP follow-up 3 months  Subjective: Miranda Olsen is an 80 y.o. year old female who is a primary patient of Bacigalupo, Dionne Bucy, MD.  The CCM team was consulted for assistance with disease management and care coordination needs.    Engaged with patient by telephone for follow up visit in response to provider referral for pharmacy case management and/or care coordination services.   Consent to Services:  The patient was given information about Chronic Care Management services, agreed to services, and gave verbal consent prior to initiation of services.  Please see initial visit note for detailed documentation.   Patient Care Team: Virginia Crews, MD as PCP - General (Family Medicine) Birder Robson, MD as Referring Physician (Ophthalmology) Murlean Iba, MD (Nephrology) Germaine Pomfret, [facility] (Pharmacist) Minna Merritts, MD as Consulting Physician (Cardiology)  Recent office visits: 07/22/2020 Lavon Paganini, MD (PCP Office Visit) for Syncopal Episode- No medication changes noted; labs ordered; patient instructed to follow-up in 6 months.   05/20/2020 Lavon Paganini, MD (PCP Office Visit) for Hypertension- No medication changes indicated; labs ordered; patient instructed to follow-up in 6 months   04/09/2020 Fabio Neighbors, LPN (PCP Clinical Support) for Medicare Wellness Exam- No medication changes noted   03/12/2020 Fenton Malling, PA-C (PCP Office Visit) for  Hypertension- No medication changes noted- Referral to Cardiology ordered  Recent consult visits: 08/26/2020 Ida Rogue, MD (Cardiology) for Syncope- No medication changes noted- patient instructed to follow-up in 12 months   07/16/2020 Murlean Iba, MD (Nephrology) for Follow-up- Statin changed to Zetia, Patient instructed to follow-up in 4 months   04/22/2020 Ida Rogue, MD (Cardiology) for New patient initial visit- Suggested she move losartan to the morning, Metoprolol and amlodipine 2 after dinner in the evening For blood pressure we will add Cardura 1 mg up to 2 mg in the morning, Ezetimibe 10 mg daily per note.    Hospital visits: None in previous 6 months   Objective:  Lab Results  Component Value Date   CREATININE 1.4 (A) 07/16/2020   BUN 28 (H) 05/20/2020   GFRNONAA 33 (L) 09/21/2019   GFRAA 43 07/16/2020   NA 137 05/20/2020   K 4.5 07/16/2020   CALCIUM 9.1 07/16/2020   CO2 22 05/20/2020   GLUCOSE 127 (H) 05/20/2020    Lab Results  Component Value Date/Time   HGBA1C 6.0 (A) 05/20/2020 01:37 PM   HGBA1C 6.0 (A) 02/20/2020 11:19 AM   HGBA1C 6.5 (H) 03/19/2019 10:53 AM   HGBA1C 6.1 (H) 05/04/2018 09:34 AM   MICROALBUR 50 08/31/2016 09:04 AM    Last diabetic Eye exam:  Lab Results  Component Value Date/Time   HMDIABEYEEXA No Retinopathy 09/19/2020 12:00 AM    Last diabetic Foot exam: No results found for: HMDIABFOOTEX   Lab Results  Component Value Date   CHOL 236 (H) 05/20/2020   HDL 74 05/20/2020   LDLCALC 143 (H) 05/20/2020   TRIG 107 05/20/2020   CHOLHDL 3.2 05/20/2020    Hepatic Function Latest Ref Rng & Units 05/20/2020 09/21/2019  06/18/2019  Total Protein 6.0 - 8.5 g/dL 6.7 - -  Albumin 3.7 - 4.7 g/dL 4.1 4.1 4.4  AST 0 - 40 IU/L 19 - -  ALT 0 - 32 IU/L 23 - -  Alk Phosphatase 44 - 121 IU/L 65 - -  Total Bilirubin 0.0 - 1.2 mg/dL <0.2 - -    Lab Results  Component Value Date/Time   TSH 0.999 07/22/2020 09:06 AM   TSH 1.320 03/03/2017  10:13 AM    CBC Latest Ref Rng & Units 07/22/2020 07/16/2020 06/18/2019  WBC 3.4 - 10.8 x10E3/uL 4.9 - 4.9  Hemoglobin 11.1 - 15.9 g/dL 10.4(L) 10.6(A) 11.3  Hematocrit 34.0 - 46.6 % 31.6(L) 32(A) 34.3  Platelets 150 - 450 x10E3/uL 250 246 213    Lab Results  Component Value Date/Time   VD25OH 34.8 05/20/2020 01:44 PM   VD25OH 26.9 (L) 03/19/2019 10:53 AM    Clinical ASCVD: No  The 10-year ASCVD risk score (Arnett DK, et al., 2019) is: 53.3%   Values used to calculate the score:     Age: 41 years     Sex: Female     Is Non-Hispanic African American: Yes     Diabetic: Yes     Tobacco smoker: No     Systolic Blood Pressure: 833 mmHg     Is BP treated: Yes     HDL Cholesterol: 74 mg/dL     Total Cholesterol: 236 mg/dL    Depression screen [facility] 2/9 05/20/2020 04/09/2020 03/14/2019  Decreased Interest 0 0 0  Down, Depressed, Hopeless 0 1 0  PHQ - 2 Score 0 1 0  Altered sleeping 0 - -  Tired, decreased energy 3 - -  Change in appetite 0 - -  Feeling bad or failure about yourself  0 - -  Trouble concentrating 0 - -  Moving slowly or fidgety/restless 0 - -  Suicidal thoughts 0 - -  PHQ-9 Score 3 - -  Difficult doing work/chores Extremely dIfficult - -    Social History   Tobacco Use  Smoking Status Never  Smokeless Tobacco Never   BP Readings from Last 3 Encounters:  08/26/20 140/82  07/22/20 (!) 152/80  05/20/20 136/88   Pulse Readings from Last 3 Encounters:  08/26/20 68  07/22/20 67  05/20/20 86   Wt Readings from Last 3 Encounters:  08/26/20 189 lb 4 oz (85.8 kg)  07/22/20 187 lb (84.8 kg)  05/20/20 189 lb 11.2 oz (86 kg)   BMI Readings from Last 3 Encounters:  08/26/20 34.61 kg/m  07/22/20 34.20 kg/m  05/20/20 34.70 kg/m    Assessment/Interventions: Review of patient past medical history, allergies, medications, health status, including review of consultants reports, laboratory and other test data, was performed as part of comprehensive evaluation and  provision of chronic care management services.   SDOH:  (Social Determinants of Health) assessments and interventions performed: Yes SDOH Interventions    Flowsheet Row Most Recent Value  SDOH Interventions   Financial Strain Interventions Intervention Not Indicated      SDOH Screenings   Alcohol Screen: Low Risk    Last Alcohol Screening Score (AUDIT): 2  Depression (PHQ2-9): Low Risk    PHQ-2 Score: 3  Financial Resource Strain: Low Risk    Difficulty of Paying Living Expenses: Not hard at all  Food Insecurity: No Food Insecurity   Worried About Charity fundraiser in the Last Year: Never true   Ran Out of Food in the Last  Year: Never true  Housing: Low Risk    Last Housing Risk Score: 0  Physical Activity: Inactive   Days of Exercise per Week: 0 days   Minutes of Exercise per Session: 0 min  Social Connections: Moderately Isolated   Frequency of Communication with Friends and Family: More than three times a week   Frequency of Social Gatherings with Friends and Family: More than three times a week   Attends Religious Services: More than 4 times per year   Active Member of [employer] or Organizations: No   Attends Archivist Meetings: Never   Marital Status: Widowed  Stress: No Stress Concern Present   Feeling of Stress : Only a little  Tobacco Use: Low Risk    Smoking Tobacco Use: Never   Smokeless Tobacco Use: Never  Transportation Needs: No Transportation Needs   Lack of Transportation (Medical): No   Lack of Transportation (Non-Medical): No    CCM Care Plan  Allergies  Allergen Reactions   Clonidine Derivatives Other (See Comments)    Medications Reviewed Today     Reviewed by Anselm Pancoast, CMA (Certified Medical Assistant) on 08/26/20 at 1151  Med List Status: <None>   Medication Order Taking? Sig Documenting Provider Last Dose Status Informant  amLODipine (NORVASC) 5 MG tablet [id number] Yes TAKE 1 TABLET BY MOUTH EVERY DAY Mar Daring,  PA-C Taking Active   cholecalciferol (VITAMIN D) 1000 UNITS tablet [id number] Yes Take 1,000 Units by mouth daily. Pt taking 2000 daily [provider] Taking Active Self           Med Note Rolena Infante, TIFFANY N   Sat Jan 25, 2015  5:50 PM)    Coenzyme Q10 (CO Q-10) 200 MG CAPS [id number] Yes Take 200 mg by mouth daily. [provider] Taking Active   doxazosin (CARDURA) 2 MG tablet [id number] Yes Take 1 tablet (2 mg total) by mouth daily. Minna Merritts, MD Taking Active   ezetimibe (ZETIA) 10 MG tablet [id number] Yes Take 1 tablet (10 mg total) by mouth daily. Minna Merritts, MD Taking Active   ferrous sulfate 325 (65 FE) MG tablet [id number] Yes TAKE 1 TABLET BY MOUTH EVERY DAY Mar Daring, PA-C Taking Active   losartan (COZAAR) 100 MG tablet [id number] Yes Take 1 tablet (100 mg total) by mouth daily. Virginia Crews, MD Taking Active   metFORMIN (GLUCOPHAGE) 500 MG tablet [id number] Yes Take 1 tablet (500 mg total) by mouth daily with breakfast. Mar Daring, PA-C Taking Active   metoprolol succinate (TOPROL-XL) 50 MG 24 hr tablet [id number] Yes Take 1 tablet (50 mg total) by mouth daily. TAKE WITH OR IMMEDIATELY FOLLOWING A MEAL. Birdie Sons, MD Taking Active   omeprazole (PRILOSEC) 40 MG capsule [id number] Yes TAKE 1 CAPSULE BY MOUTH EVERY DAY Mar Daring, PA-C Taking Active   torsemide (DEMADEX) 10 MG tablet [id number] Yes Take 1 tablet (10 mg total) by mouth every morning. Mar Daring, [place] Taking Active             Patient Active Problem List   Diagnosis Date Noted   Syncope 07/22/2020   Myalgia due to statin 05/20/2020   Epigastric hernia 06/21/2019   Diastasis recti 06/21/2019   Obesity 06/21/2019   Bilateral inguinal hernia 93/79/0240   Umbilical hernia without obstruction and without gangrene 06/21/2019   CKD stage 3 due to type 2 diabetes mellitus ([place]) 06/18/2019   Low back pain 07/11/2015   Acute blood  loss anemia  01/25/2015   Chronic kidney disease (CKD), stage III (moderate) (HCC) 07/31/2014   Dysfunction of eustachian tube 05/30/2014   Acid reflux 05/30/2014   Burning or prickling sensation 05/30/2014   Deficiency of vitamin B 07/05/2008   Anemia, deficiency 06/26/2008   Avitaminosis D 06/26/2008   Benign hypertension with CKD (chronic kidney disease) stage III ([place]) 06/15/2008   Hypercholesteremia 06/12/2008   Diabetes mellitus without complication ([place]) 54/65/0354   Colon, diverticulosis 06/06/2008   Apnea, sleep 01/26/2007   History of colon polyps 08/09/2006    Immunization History  Administered Date(s) Administered   Moderna Sars-Covid-2 Vaccination 02/06/2019, 03/06/2019, 11/28/2019   Pneumococcal Conjugate-13 10/03/2013   Pneumococcal Polysaccharide-23 03/25/2011    Conditions to be addressed/monitored:  Hypertension, Hyperlipidemia, Diabetes, GERD, and Chronic Kidney Disease  Care Plan : General Pharmacy (Adult)  Updates made by Germaine Pomfret, RPH since 10/31/2020 12:00 AM     Problem: Hypertension, Hyperlipidemia, Diabetes, GERD, and Chronic Kidney Disease   Priority: High     Long-Range Goal: Patient-Specific Goal   Start Date: 10/31/2020  Expected End Date: 10/31/2021  This Visit's Progress: On track  Priority: High  Note:   Current Barriers:  Unable to independently monitor therapeutic efficacy  Pharmacist Clinical Goal(s):  Patient will maintain control of blood pressure as evidenced by BP less than 140/90  through collaboration with PharmD and provider.   Interventions: 1:1 collaboration with Virginia Crews, MD regarding development and update of comprehensive plan of care as evidenced by provider attestation and co-signature Inter-disciplinary care team collaboration (see longitudinal plan of care) Comprehensive medication review performed; medication list updated in electronic medical record  Hypertension (BP goal <140/90) -Controlled -Current  treatment: Amlodipine 5 mg daily (HS) Doxazosin 2 mg daily  Losartan 100 mg daily  Metoprolol XL 50 mg daily (HS) Torsemide 10 mg daily  -Medications previously tried: NA  -Current home readings: Does not monitor blood pressure at home -Denies hypotensive/hypertensive symptoms -One episode of syncope at church in June, none since then. -Recommended to continue current medication -BP Kit sent into patient pharmacy.   Hyperlipidemia: (LDL goal < 100) -Controlled -Current treatment: Ezetimibe 10 mg daily  -Medications previously tried: NA  -Recommended to continue current medication  Diabetes (A1c goal <6.5%) -Controlled -Current medications: Metformin 500 mg daily  -Medications previously tried: NA  -Recommended to continue current medication  Chronic Kidney Disease Stage 3b  -All medications assessed for renal dosing and appropriateness in chronic kidney disease. -Recommended to continue current medication  Patient Goals/Self-Care Activities Patient will:  - check blood pressure 2-3 times weekly, document, and provide at future appointments  Follow Up Plan: Telephone follow up appointment with care management team member scheduled for:  01/27/2021 at 8:30 AM      Medication Assistance: None required.  Patient affirms current coverage meets needs.  Compliance/Adherence/Medication fill history: Care Gaps: Zoster Vaccines Diabetic Foot Exam COVID-19 Booster 4 Influenza Vaccine Blood Pressure > 140/90  Star-Rating Drugs: Losartan 100 mg  last filled on 08/17/2020 for a 90-Day supply with [facility] Metformin 500 mg last filled on 09/12/2020 for a 90-Day supply with [facility]  Patient's preferred pharmacy is:  [id number] [place], [place] - [address] [place] [address] Phone: [phone] Fax: [phone]  Uses pill box? Yes Pt endorses 100% compliance  We discussed: Current pharmacy is preferred with insurance plan and  patient is satisfied with pharmacy services Patient decided to: Continue current medication management  strategy  Care Plan and Follow Up Patient Decision:  Patient agrees to Care Plan and Follow-up.  Plan: Telephone follow up appointment with care management team member scheduled for:  01/27/2021 at 8:30 AM  [name], PharmD, [place], [place] [phone]

## 2020-10-31 NOTE — Patient Instructions (Signed)
Visit Information It was great speaking with you today!  Please let me know if you have any questions about our visit.   Goals Addressed             This Visit's Progress    Track and Manage My Blood Pressure-Hypertension       Timeframe:  Long-Range Goal Priority:  High Start Date: 10/31/2020                             Expected End Date: 10/31/2021                      Follow Up within 90 days   - check blood pressure 3 times per week - write blood pressure results in a log or diary    Why is this important?   You won't feel high blood pressure, but it can still hurt your blood vessels.  High blood pressure can cause heart or kidney problems. It can also cause a stroke.  Making lifestyle changes like losing a little weight or eating less salt will help.  Checking your blood pressure at home and at different times of the day can help to control blood pressure.  If the doctor prescribes medicine remember to take it the way the doctor ordered.  Call the office if you cannot afford the medicine or if there are questions about it.     Notes:         Patient Care Plan: General Pharmacy (Adult)     Problem Identified: Hypertension, Hyperlipidemia, Diabetes, GERD, and Chronic Kidney Disease   Priority: High     Long-Range Goal: Patient-Specific Goal   Start Date: 10/31/2020  Expected End Date: 10/31/2021  This Visit's Progress: On track  Priority: High  Note:   Current Barriers:  Unable to independently monitor therapeutic efficacy  Pharmacist Clinical Goal(s):  Patient will maintain control of blood pressure as evidenced by BP less than 140/90  through collaboration with PharmD and provider.   Interventions: 1:1 collaboration with Virginia Crews, MD regarding development and update of comprehensive plan of care as evidenced by provider attestation and co-signature Inter-disciplinary care team collaboration (see longitudinal plan of care) Comprehensive medication  review performed; medication list updated in electronic medical record  Hypertension (BP goal <140/90) -Controlled -Current treatment: Amlodipine 5 mg daily (HS) Doxazosin 2 mg daily  Losartan 100 mg daily  Metoprolol XL 50 mg daily (HS) Torsemide 10 mg daily  -Medications previously tried: NA  -Current home readings: Does not monitor blood pressure at home -Denies hypotensive/hypertensive symptoms -One episode of syncope at church in June, none since then. -Recommended to continue current medication -BP Kit sent into patient pharmacy.   Hyperlipidemia: (LDL goal < 100) -Controlled -Current treatment: Ezetimibe 10 mg daily  -Medications previously tried: NA  -Recommended to continue current medication  Diabetes (A1c goal <6.5%) -Controlled -Current medications: Metformin 500 mg daily  -Medications previously tried: NA  -Recommended to continue current medication  Chronic Kidney Disease Stage 3b  -All medications assessed for renal dosing and appropriateness in chronic kidney disease. -Recommended to continue current medication  Patient Goals/Self-Care Activities Patient will:  - check blood pressure 2-3 times weekly, document, and provide at future appointments  Follow Up Plan: Telephone follow up appointment with care management team member scheduled for:  01/27/2021 at 8:30 AM      Patient agreed to services and verbal consent obtained.  The patient verbalized understanding of instructions, educational materials, and care plan provided today and declined offer to receive copy of patient instructions, educational materials, and care plan.   [name], PharmD, [place], [place] [phone]

## 2020-11-09 ENCOUNTER — Other Ambulatory Visit: Payer: Self-pay | Admitting: Family Medicine

## 2020-11-09 DIAGNOSIS — I129 Hypertensive chronic kidney disease with stage 1 through stage 4 chronic kidney disease, or unspecified chronic kidney disease: Secondary | ICD-10-CM

## 2020-11-09 NOTE — Telephone Encounter (Signed)
Too soon- last 08/26/20 #90 3 RF Last RF was by prescriber not at this practice

## 2020-11-17 DIAGNOSIS — N1832 Chronic kidney disease, stage 3b: Secondary | ICD-10-CM | POA: Diagnosis not present

## 2020-11-17 DIAGNOSIS — E1122 Type 2 diabetes mellitus with diabetic chronic kidney disease: Secondary | ICD-10-CM | POA: Diagnosis not present

## 2020-11-17 DIAGNOSIS — I10 Essential (primary) hypertension: Secondary | ICD-10-CM | POA: Diagnosis not present

## 2020-11-20 ENCOUNTER — Encounter: Payer: Self-pay | Admitting: Family Medicine

## 2020-11-20 ENCOUNTER — Ambulatory Visit (INDEPENDENT_AMBULATORY_CARE_PROVIDER_SITE_OTHER): Payer: Medicare Other | Admitting: Family Medicine

## 2020-11-20 ENCOUNTER — Other Ambulatory Visit: Payer: Self-pay

## 2020-11-20 VITALS — BP 136/76 | HR 67 | Temp 97.6°F | Resp 16 | Ht 60.0 in | Wt 195.5 lb

## 2020-11-20 DIAGNOSIS — I129 Hypertensive chronic kidney disease with stage 1 through stage 4 chronic kidney disease, or unspecified chronic kidney disease: Secondary | ICD-10-CM | POA: Diagnosis not present

## 2020-11-20 DIAGNOSIS — E1169 Type 2 diabetes mellitus with other specified complication: Secondary | ICD-10-CM

## 2020-11-20 DIAGNOSIS — E1122 Type 2 diabetes mellitus with diabetic chronic kidney disease: Secondary | ICD-10-CM | POA: Diagnosis not present

## 2020-11-20 DIAGNOSIS — Z6838 Body mass index (BMI) 38.0-38.9, adult: Secondary | ICD-10-CM

## 2020-11-20 DIAGNOSIS — E119 Type 2 diabetes mellitus without complications: Secondary | ICD-10-CM

## 2020-11-20 DIAGNOSIS — E1159 Type 2 diabetes mellitus with other circulatory complications: Secondary | ICD-10-CM

## 2020-11-20 DIAGNOSIS — E785 Hyperlipidemia, unspecified: Secondary | ICD-10-CM | POA: Diagnosis not present

## 2020-11-20 DIAGNOSIS — N183 Chronic kidney disease, stage 3 unspecified: Secondary | ICD-10-CM

## 2020-11-20 DIAGNOSIS — I152 Hypertension secondary to endocrine disorders: Secondary | ICD-10-CM | POA: Diagnosis not present

## 2020-11-20 LAB — POCT GLYCOSYLATED HEMOGLOBIN (HGB A1C): Hemoglobin A1C: 6.4 % — AB (ref 4.0–5.6)

## 2020-11-20 NOTE — Assessment & Plan Note (Signed)
Discussed importance of healthy weight management Discussed diet and exercise  

## 2020-11-20 NOTE — Assessment & Plan Note (Signed)
Previously well controlled Continue statin Reviewed last FLP and CMP

## 2020-11-20 NOTE — Progress Notes (Signed)
Established patient visit   Patient: Miranda Olsen   DOB: January 11, 1941   80 y.o. Female  MRN: [id number] Visit Date: 11/20/2020  Today's healthcare provider: Lavon Paganini, MD   Chief Complaint  Patient presents with   Diabetes   Hyperlipidemia   Hypertension   Subjective    Diabetes  Hyperlipidemia  Hypertension   Diabetes Mellitus Type II, follow-up  Lab Results  Component Value Date   HGBA1C 6.4 (A) 11/20/2020   HGBA1C 6.0 (A) 05/20/2020   HGBA1C 6.0 (A) 02/20/2020   Last seen for diabetes 4 months ago.  Management since then includes continuing the same treatment. She reports excellent compliance with treatment. She is not having side effects.   Home blood sugar records:  not being checked  Episodes of hypoglycemia? No    Current insulin regiment: none Most Recent Eye Exam: UTD  --------------------------------------------------------------------------------------------------- Hypertension, follow-up  BP Readings from Last 3 Encounters:  11/20/20 136/76  08/26/20 140/82  07/22/20 (!) 152/80   Wt Readings from Last 3 Encounters:  11/20/20 195 lb 8 oz (88.7 kg)  08/26/20 189 lb 4 oz (85.8 kg)  07/22/20 187 lb (84.8 kg)     She was last seen for hypertension 4 months ago.  BP at that visit was 152/80. Management since that visit includes . She reports excellent compliance with treatment. She is not having side effects.  She is exercising. She is adherent to low salt diet.   Outside blood pressures are stable.  She does not smoke.  Use of agents associated with hypertension: none.   --------------------------------------------------------------------------------------------------- Lipid/Cholesterol, follow-up  Last Lipid Panel: Lab Results  Component Value Date   CHOL 236 (H) 05/20/2020   LDLCALC 143 (H) 05/20/2020   HDL 74 05/20/2020   TRIG 107 05/20/2020    She was last seen for this 4 months ago.  Management since that  visit includes no changes.  She reports excellent compliance with treatment. She is not having side effects.   Symptoms: No appetite changes No foot ulcerations  No chest pain No chest pressure/discomfort  No dyspnea No orthopnea  No fatigue No lower extremity edema  No palpitations No paroxysmal nocturnal dyspnea  No nausea No numbness or tingling of extremity  No polydipsia No polyuria  No speech difficulty No syncope   She is following a Regular diet. Current exercise: walking  Last metabolic panel Lab Results  Component Value Date   GLUCOSE 127 (H) 05/20/2020   NA 137 05/20/2020   K 4.5 07/16/2020   BUN 28 (H) 05/20/2020   CREATININE 1.4 (A) 07/16/2020   EGFR 29 (L) 05/20/2020   GFRNONAA 33 (L) 09/21/2019   CALCIUM 9.1 07/16/2020   AST 19 05/20/2020   ALT 23 05/20/2020   The 10-year ASCVD risk score (Arnett DK, et al., 2019) is: 52.1%  ----------------------------------------------------------------------     Medications: Outpatient Medications Prior to Visit  Medication Sig   amLODipine (NORVASC) 5 MG tablet Take 1 tablet (5 mg total) by mouth daily.   Blood Pressure KIT Use to check blood pressure as directed   cholecalciferol (VITAMIN D) 1000 UNITS tablet Take 1,000 Units by mouth daily. Pt taking 2000 daily   Coenzyme Q10 (CO Q-10) 200 MG CAPS Take 200 mg by mouth daily.   doxazosin (CARDURA) 2 MG tablet Take 1 tablet (2 mg total) by mouth daily.   ezetimibe (ZETIA) 10 MG tablet Take 1 tablet (10 mg total) by mouth daily.   ferrous sulfate  325 (65 FE) MG tablet TAKE 1 TABLET BY MOUTH EVERY DAY   losartan (COZAAR) 100 MG tablet Take 1 tablet (100 mg total) by mouth daily.   metFORMIN (GLUCOPHAGE) 500 MG tablet Take 1 tablet (500 mg total) by mouth daily with breakfast.   metoprolol succinate (TOPROL-XL) 50 MG 24 hr tablet Take 1 tablet (50 mg total) by mouth daily. TAKE WITH OR IMMEDIATELY FOLLOWING A MEAL.   omeprazole (PRILOSEC) 40 MG capsule TAKE 1  CAPSULE BY MOUTH EVERY DAY   torsemide (DEMADEX) 10 MG tablet Take 1 tablet (10 mg total) by mouth every morning.   No facility-administered medications prior to visit.    Review of Systems  Constitutional: Negative.   Respiratory: Negative.    Cardiovascular: Negative.        Objective    BP 136/76 (BP Location: Left Arm, Patient Position: Sitting, Cuff Size: Large)   Pulse 67   Temp 97.6 F (36.4 C) (Temporal)   Resp 16   Ht 5' (1.524 m)   Wt 195 lb 8 oz (88.7 kg)   SpO2 96%   BMI 38.18 kg/m  BP Readings from Last 3 Encounters:  11/20/20 136/76  08/26/20 140/82  07/22/20 (!) 152/80   Wt Readings from Last 3 Encounters:  11/20/20 195 lb 8 oz (88.7 kg)  08/26/20 189 lb 4 oz (85.8 kg)  07/22/20 187 lb (84.8 kg)      Physical Exam Vitals reviewed.  Constitutional:      General: She is not in acute distress.    Appearance: Normal appearance. She is well-developed. She is not diaphoretic.  HENT:     Head: Normocephalic and atraumatic.  Eyes:     General: No scleral icterus.    Conjunctiva/sclera: Conjunctivae normal.  Neck:     Thyroid: No thyromegaly.  Cardiovascular:     Rate and Rhythm: Normal rate and regular rhythm.     Pulses: Normal pulses.     Heart sounds: Normal heart sounds. No murmur heard. Pulmonary:     Effort: Pulmonary effort is normal. No respiratory distress.     Breath sounds: Normal breath sounds. No wheezing, rhonchi or rales.  Musculoskeletal:     Cervical back: Neck supple.     Right lower leg: No edema.     Left lower leg: No edema.  Lymphadenopathy:     Cervical: No cervical adenopathy.  Skin:    General: Skin is warm and dry.     Findings: No rash.  Neurological:     Mental Status: She is alert and oriented to person, place, and time. Mental status is at baseline.  Psychiatric:        Mood and Affect: Mood normal.        Behavior: Behavior normal.      Results for orders placed or performed in visit on 11/20/20  POCT  glycosylated hemoglobin (Hb A1C)  Result Value Ref Range   Hemoglobin A1C 6.4 (A) 4.0 - 5.6 %    Assessment & Plan     Problem List Items Addressed This Visit       Cardiovascular and Mediastinum   Benign hypertension with CKD (chronic kidney disease) stage III (HCC)   Hypertension associated with diabetes ([place]) - Primary    Well controlled Continue current medications Recheck metabolic panel F/u in 6 months         Endocrine   T2DM (type 2 diabetes mellitus) ([place])    Well controleld Continue metformin UTD on screenings F/u  in 17m     Hyperlipidemia associated with type 2 diabetes mellitus ([place]    Previously well controlled Continue statin Reviewed last FLP and CMP      CKD stage 3 due to type 2 diabetes mellitus ([place]    F/b nephrology Reviewed recent labs        Other   Obesity    Discussed importance of healthy weight management Discussed diet and exercise         Return in about 6 months (around 05/21/2021) for CPE, AWV.      I, ALavon Paganini MD, have reviewed all documentation for this visit. The documentation on 11/20/20 for the exam, diagnosis, procedures, and orders are all accurate and complete.   Lasean Rahming, ADionne Bucy MD, MPH [facility]

## 2020-11-20 NOTE — Assessment & Plan Note (Signed)
Well controleld Continue metformin UTD on screenings F/u in 80m

## 2020-11-20 NOTE — Assessment & Plan Note (Signed)
F/b nephrology Reviewed recent labs 

## 2020-11-20 NOTE — Assessment & Plan Note (Signed)
Well controlled Continue current medications Recheck metabolic panel F/u in 6 months  

## 2020-11-24 DIAGNOSIS — I10 Essential (primary) hypertension: Secondary | ICD-10-CM

## 2020-11-24 DIAGNOSIS — E119 Type 2 diabetes mellitus without complications: Secondary | ICD-10-CM

## 2020-12-11 ENCOUNTER — Other Ambulatory Visit: Payer: Self-pay | Admitting: Family Medicine

## 2020-12-11 DIAGNOSIS — N183 Chronic kidney disease, stage 3 unspecified: Secondary | ICD-10-CM

## 2020-12-11 DIAGNOSIS — I129 Hypertensive chronic kidney disease with stage 1 through stage 4 chronic kidney disease, or unspecified chronic kidney disease: Secondary | ICD-10-CM

## 2020-12-11 NOTE — Telephone Encounter (Signed)
Requested medication (s) are due for refill today: Yes  Requested medication (s) are on the active medication list: Yes  Last refill:  08/26/20  Future visit scheduled: Yes  Notes to clinic:  Last filled by Dr. Rockey Situ.    Requested Prescriptions  Pending Prescriptions Disp Refills   metoprolol succinate (TOPROL-XL) 50 MG 24 hr tablet [Pharmacy Med Name: METOPROLOL SUCC ER 50 MG TAB] 90 tablet 1    Sig: TAKE 1 TABLET BY MOUTH DAILY. TAKE WITH OR IMMEDIATELY FOLLOWING A MEAL.     Cardiovascular:  Beta Blockers Passed - 12/11/2020  8:14 AM      Passed - Last BP in normal range    BP Readings from Last 1 Encounters:  11/20/20 136/76          Passed - Last Heart Rate in normal range    Pulse Readings from Last 1 Encounters:  11/20/20 67          Passed - Valid encounter within last 6 months    Recent Outpatient Visits           3 weeks ago Hypertension associated with diabetes [facility])   [place], Dionne Bucy, MD   4 months ago Syncope, unspecified syncope type   [facility], Dionne Bucy, MD   6 months ago Benign hypertension with CKD (chronic kidney disease) stage III [facility])   [facility] [place], Dionne Bucy, MD   9 months ago Resistant hypertension   [facility] Fenton Malling M, [place]   9 months ago Type 2 diabetes mellitus with stage 4 chronic kidney disease, without long-term current use of insulin [facility])   [facility] [place], Clearnce Sorrel, [place]       Future Appointments             In 4 months Bacigalupo, Dionne Bucy, MD [facility], [place]

## 2020-12-14 ENCOUNTER — Other Ambulatory Visit: Payer: Self-pay | Admitting: Family Medicine

## 2020-12-14 DIAGNOSIS — N183 Chronic kidney disease, stage 3 unspecified: Secondary | ICD-10-CM

## 2020-12-14 DIAGNOSIS — I129 Hypertensive chronic kidney disease with stage 1 through stage 4 chronic kidney disease, or unspecified chronic kidney disease: Secondary | ICD-10-CM

## 2020-12-14 NOTE — Telephone Encounter (Signed)
informed [facility] Hawking [facility] that med dc'd 2 days ago by Dr. Rockey Situ

## 2020-12-15 ENCOUNTER — Other Ambulatory Visit: Payer: Self-pay | Admitting: Family Medicine

## 2020-12-15 DIAGNOSIS — I129 Hypertensive chronic kidney disease with stage 1 through stage 4 chronic kidney disease, or unspecified chronic kidney disease: Secondary | ICD-10-CM

## 2020-12-15 DIAGNOSIS — N183 Chronic kidney disease, stage 3 unspecified: Secondary | ICD-10-CM

## 2020-12-15 DIAGNOSIS — K219 Gastro-esophageal reflux disease without esophagitis: Secondary | ICD-10-CM

## 2020-12-15 MED ORDER — OMEPRAZOLE 40 MG PO CPDR: DELAYED_RELEASE_CAPSULE | ORAL | 0 refills | Status: DC

## 2020-12-15 NOTE — Telephone Encounter (Signed)
Requested Prescriptions  Pending Prescriptions Disp Refills  . omeprazole (PRILOSEC) 40 MG capsule 90 capsule 0    Sig: TAKE 1 CAPSULE BY MOUTH EVERY DAY Strength: 40 mg     Gastroenterology: Proton Pump Inhibitors Passed - 12/15/2020  3:53 PM      Passed - Valid encounter within last 12 months    Recent Outpatient Visits          3 weeks ago Hypertension associated with diabetes [facility])   [facility] [place], Dionne Bucy, MD   4 months ago Syncope, unspecified syncope type   [facility] [address], Dionne Bucy, MD   6 months ago Benign hypertension with CKD (chronic kidney disease) stage III [facility])   [facility], Dionne Bucy, MD   9 months ago Resistant hypertension   [facility] Fenton Malling M, [place]   9 months ago Type 2 diabetes mellitus with stage 4 chronic kidney disease, without long-term current use of insulin [facility])   [facility] [place], Clearnce Sorrel, [place]      Future Appointments            In 4 months Bacigalupo, Dionne Bucy, MD [facility], [facility]           . metoprolol succinate (TOPROL-XL) 50 MG 24 hr tablet 90 tablet 3    Sig: Take 1 tablet (50 mg total) by mouth daily. TAKE WITH OR IMMEDIATELY FOLLOWING A MEAL.     Cardiovascular:  Beta Blockers Passed - 12/15/2020  3:53 PM      Passed - Last BP in normal range    BP Readings from Last 1 Encounters:  11/20/20 136/76         Passed - Last Heart Rate in normal range    Pulse Readings from Last 1 Encounters:  11/20/20 67         Passed - Valid encounter within last 6 months    Recent Outpatient Visits          3 weeks ago Hypertension associated with diabetes [facility])   [facility] [place], Dionne Bucy, MD   4 months ago Syncope, unspecified syncope type   [facility], Dionne Bucy, MD   6 months ago Benign hypertension with CKD (chronic kidney disease) stage III [facility])    [facility] [place], Dionne Bucy, MD   9 months ago Resistant hypertension   [facility] Fenton Malling M, [place]   9 months ago Type 2 diabetes mellitus with stage 4 chronic kidney disease, without long-term current use of insulin [facility])   [facility], Clearnce Sorrel, [place]      Future Appointments            In 4 months Bacigalupo, Dionne Bucy, MD [facility], [place]

## 2020-12-15 NOTE — Telephone Encounter (Signed)
Copied from [place] [id number]. Topic: Quick Communication - Rx Refill/Question >> Dec 15, 2020  2:21 PM Leward Quan A wrote: Medication: metoprolol succinate (TOPROL-XL) 50 MG 24 hr tablet, omeprazole (PRILOSEC) 40 MG capsule Per patient will have none for tomorrow  Has the patient contacted their pharmacy? Yes.  Contact Dr  (Agent: If no, request that the patient contact the pharmacy for the refill. If patient does not wish to contact the pharmacy document the reason why and proceed with request.) (Agent: If yes, when and what did the pharmacy advise?)  Preferred Pharmacy (with phone number or street name): [id number] [place], [place] - [address]  Phone:  [phone] Fax:  [phone]    Has the patient been seen for an appointment in the last year OR does the patient have an upcoming appointment? Yes.    Agent: Please be advised that RX refills may take up to 3 business days. We ask that you follow-up with your pharmacy.

## 2020-12-16 ENCOUNTER — Other Ambulatory Visit: Payer: Self-pay | Admitting: Physician Assistant

## 2020-12-16 DIAGNOSIS — K219 Gastro-esophageal reflux disease without esophagitis: Secondary | ICD-10-CM

## 2020-12-16 MED ORDER — METOPROLOL SUCCINATE ER 50 MG PO TB24: 50.0000 mg | ORAL_TABLET | Freq: Every day | ORAL | 0 refills | Status: DC

## 2020-12-16 NOTE — Telephone Encounter (Signed)
Requested medications are on the active medication list yes  Last visit 11/20/20  Future visit scheduled 04/17/21  Notes to clinic prescriber not in this practice. Please assess.\ Requested Prescriptions  Pending Prescriptions Disp Refills   metoprolol succinate (TOPROL-XL) 50 MG 24 hr tablet 90 tablet 3    Sig: Take 1 tablet (50 mg total) by mouth daily. TAKE WITH OR IMMEDIATELY FOLLOWING A MEAL.     Cardiovascular:  Beta Blockers Passed - 12/15/2020  3:53 PM      Passed - Last BP in normal range    BP Readings from Last 1 Encounters:  11/20/20 136/76          Passed - Last Heart Rate in normal range    Pulse Readings from Last 1 Encounters:  11/20/20 67          Passed - Valid encounter within last 6 months    Recent Outpatient Visits           3 weeks ago Hypertension associated with diabetes [facility])   [facility] [place], Dionne Bucy, MD   4 months ago Syncope, unspecified syncope type   [facility], Dionne Bucy, MD   7 months ago Benign hypertension with CKD (chronic kidney disease) stage III [facility])   [facility] [place], Dionne Bucy, MD   9 months ago Resistant hypertension   [facility] Fenton Malling M, PA-C   10 months ago Type 2 diabetes mellitus with stage 4 chronic kidney disease, without long-term current use of insulin [facility])   [place], Clearnce Sorrel, [place]       Future Appointments             In 4 months Bacigalupo, Dionne Bucy, MD [facility], [facility]            Signed Prescriptions Disp Refills   omeprazole (PRILOSEC) 40 MG capsule 90 capsule 0    Sig: TAKE 1 CAPSULE BY MOUTH EVERY DAY Strength: 40 mg     Gastroenterology: Proton Pump Inhibitors Passed - 12/15/2020  3:53 PM      Passed - Valid encounter within last 12 months    Recent Outpatient Visits           3 weeks ago Hypertension associated with diabetes [facility])   [facility] [place], Dionne Bucy, MD   4 months ago Syncope, unspecified syncope type   [facility], Dionne Bucy, MD   7 months ago Benign hypertension with CKD (chronic kidney disease) stage III [facility])   [facility] [place], Dionne Bucy, MD   9 months ago Resistant hypertension   [facility] Fenton Malling M, PA-C   10 months ago Type 2 diabetes mellitus with stage 4 chronic kidney disease, without long-term current use of insulin [facility])   [facility] [place], Clearnce Sorrel, [place]       Future Appointments             In 4 months Bacigalupo, Dionne Bucy, MD [facility], [facility]

## 2021-01-05 ENCOUNTER — Other Ambulatory Visit: Payer: Self-pay | Admitting: *Deleted

## 2021-01-05 DIAGNOSIS — K219 Gastro-esophageal reflux disease without esophagitis: Secondary | ICD-10-CM

## 2021-01-05 DIAGNOSIS — I10 Essential (primary) hypertension: Secondary | ICD-10-CM

## 2021-01-05 MED ORDER — OMEPRAZOLE 40 MG PO CPDR: DELAYED_RELEASE_CAPSULE | ORAL | 1 refills | Status: DC

## 2021-01-05 NOTE — Progress Notes (Signed)
Rx fail transmitting- reordered

## 2021-01-06 ENCOUNTER — Other Ambulatory Visit: Payer: Self-pay | Admitting: Physician Assistant

## 2021-01-06 ENCOUNTER — Telehealth: Payer: Self-pay

## 2021-01-06 DIAGNOSIS — K219 Gastro-esophageal reflux disease without esophagitis: Secondary | ICD-10-CM

## 2021-01-06 NOTE — Progress Notes (Signed)
Chronic Care Management Pharmacy Assistant   Name: Miranda Olsen  MRN: [id number] DOB: 09-10-1940  Reason for Encounter: Hypertension Disease State Call   Recent office visits:  11/20/2020 Miranda Latch, MD (PCP Office Visit) for Follow-up- No medication changes noted, lab order placed, patient instructed to follow-up in 6 months.  Recent consult visits:  11/17/2020 Miranda Pigeon, MD (Nephrology) for Follow-up- No medication changes noted, no orders placed, patient instructed to follow-up in 6 months.   Hospital visits:  None in previous 6 months  Medications: Outpatient Encounter Medications as of 01/06/2021  Medication Sig   amLODipine (NORVASC) 5 MG tablet Take 1 tablet (5 mg total) by mouth daily.   Blood Pressure KIT Use to check blood pressure as directed   cholecalciferol (VITAMIN D) 1000 UNITS tablet Take 1,000 Units by mouth daily. Pt taking 2000 daily   Coenzyme Q10 (CO Q-10) 200 MG CAPS Take 200 mg by mouth daily.   doxazosin (CARDURA) 2 MG tablet Take 1 tablet (2 mg total) by mouth daily.   ezetimibe (ZETIA) 10 MG tablet Take 1 tablet (10 mg total) by mouth daily.   ferrous sulfate 325 (65 FE) MG tablet TAKE 1 TABLET BY MOUTH EVERY DAY   losartan (COZAAR) 100 MG tablet Take 1 tablet (100 mg total) by mouth daily.   metFORMIN (GLUCOPHAGE) 500 MG tablet Take 1 tablet (500 mg total) by mouth daily with breakfast.   metoprolol succinate (TOPROL-XL) 50 MG 24 hr tablet Take 1 tablet (50 mg total) by mouth daily. TAKE WITH OR IMMEDIATELY FOLLOWING A MEAL.   omeprazole (PRILOSEC) 40 MG capsule TAKE 1 CAPSULE BY MOUTH EVERY DAY   torsemide (DEMADEX) 10 MG tablet Take 1 tablet (10 mg total) by mouth every morning.   No facility-administered encounter medications on file as of 01/06/2021.   Care Gaps: Zoster Vaccine  COVID-19 Booster 4  Star Rating Drugs: Losartan 100 mg last filled on 11/10/2020 for [facility]Day [facility]  Metformin 500 mg last filled  on 12/14/2020 for a [facility]-Day supply [facility]  Reviewed chart prior to disease state call. Spoke with patient regarding BP  Recent Office Vitals: BP Readings from Last 3 Encounters:  11/20/20 136/76  08/26/20 140/82  07/22/20 (!) 152/80   Pulse Readings from Last 3 Encounters:  11/20/20 67  08/26/20 68  07/22/20 67    Wt Readings from Last 3 Encounters:  11/20/20 195 lb 8 oz (88.7 kg)  08/26/20 189 lb 4 oz (85.8 kg)  07/22/20 187 lb (84.8 kg)     Kidney Function Lab Results  Component Value Date/Time   CREATININE 1.4 (A) 07/16/2020 12:00 AM   CREATININE 1.76 (H) 05/20/2020 01:44 PM   CREATININE 1.52 (H) 09/21/2019 09:48 AM   GFRNONAA 33 (L) 09/21/2019 09:48 AM   GFRAA 43 07/16/2020 12:00 AM    BMP Latest Ref Rng & Units 07/16/2020 05/20/2020 09/21/2019  Glucose 65 - 99 mg/dL - 740(K) 893(O)  BUN 8 - 27 mg/dL - 48(L) 17(Z)  Creatinine 0.5 - 1.1 1.4(A) 1.76(H) 1.52(H)  BUN/Creat Ratio 12 - 28 - 16 19  Sodium 134 - 144 mmol/L - 137 137  Potassium 3.4 - 5.3 4.5 4.9 4.9  Chloride 96 - 106 mmol/L - 102 99  CO2 20 - 29 mmol/L - 22 24  Calcium 8.7 - 10.7 9.1 9.4 9.6    Current antihypertensive regimen:  Metoprolol Succinate 50 mg 1 tablet daily  Amlodipine 5 mg 1 tablet daily  Doxazosin 2 mg 1 tablet daily Losartan 100 mg 1 tablet daily  How often are you checking your Blood Pressure?  Patient is currently not monitoring her  blood pressure at home  Current home BP readings: Since patient is not currently monitoring her blood pressure she does not have any readings to provide  What recent interventions/DTPs have been made by any provider to improve Blood Pressure control since last CPP Visit: None ID  Any recent hospitalizations or ED visits since last visit with CPP? No  What diet changes have been made to improve Blood Pressure Control?  Patient is not on any diets. Patient stated she doesn't add any extra salt to her food.  What exercise is being done to  improve your Blood Pressure Control?  Patient does not exercise due to her back pain. Patient is not able to stand up for long periods of time  Adherence Review: Is the patient currently on ACE/ARB medication? Yes Does the patient have >5 day gap between last estimated fill dates? No  Patient reports that she is doing well. She denies any ill symptoms. She reports to taking her medications as directed. She does not monitor her blood sugar or blood pressure at home. She denies having any hypertension or hyperglycemic symptoms. Patient stated she called in a refill for her Omeprazole. I informed her it appears it was sent to her pharmacy today. Patient has no concerns or issues at this time.   Patient has a telephone appointment with CPP on 01/28/2020 $[email or web address]$   [name], [place] Clinical Pharmacist Assistant Phone: [phone]

## 2021-01-07 ENCOUNTER — Other Ambulatory Visit: Payer: Self-pay | Admitting: Cardiovascular Disease

## 2021-01-07 NOTE — Telephone Encounter (Signed)
01/05/21 prescription was "printed" not e-scribed.

## 2021-01-27 ENCOUNTER — Ambulatory Visit (INDEPENDENT_AMBULATORY_CARE_PROVIDER_SITE_OTHER): Payer: Medicare Other

## 2021-01-27 DIAGNOSIS — I152 Hypertension secondary to endocrine disorders: Secondary | ICD-10-CM

## 2021-01-27 DIAGNOSIS — E1169 Type 2 diabetes mellitus with other specified complication: Secondary | ICD-10-CM

## 2021-01-27 MED ORDER — DOXAZOSIN MESYLATE 2 MG PO TABS: 2.0000 mg | ORAL_TABLET | Freq: Every day | ORAL | 2 refills | Status: DC

## 2021-01-27 NOTE — Progress Notes (Signed)
Chronic Care Management Pharmacy Note  01/27/2021 Name:  Miranda Olsen MRN:  [id number] DOB:  [birth year]  Summary: Patient presents for CCM follow-up. Home blood pressures are elevated, but she monitors with a wrist monitor. Last office BP was well controlled. She struggles with consistent administration of her ezetimibe.   Recommendations/Changes made from today's visit: Continue current medications  Plan: CPP follow-up 6 months  Subjective: Miranda Olsen is an 81 y.o. year old female who is a primary patient of Bacigalupo, Marzella Schlein, MD.  The CCM team was consulted for assistance with disease management and care coordination needs.    Engaged with patient by telephone for follow up visit in response to provider referral for pharmacy case management and/or care coordination services.   Consent to Services:  The patient was given information about Chronic Care Management services, agreed to services, and gave verbal consent prior to initiation of services.  Please see initial visit note for detailed documentation.   Patient Care Team: Erasmo Downer, MD as PCP - General (Family Medicine) Galen Manila, MD as Referring Physician (Ophthalmology) [name], MD (Nephrology) Gaspar Cola, [facility] (Pharmacist) Antonieta Iba, MD as Consulting Physician (Cardiology)  Recent office visits: 11/20/2020 [name], MD (PCP Office Visit) for follow-up. A1c 6.4%, BP 136/76.    Recent consult visits: 11/17/2020 [name], MD (Nephrology) for Follow-up- no medication changes noted.   08/26/2020 Julien Nordmann, MD (Cardiology) for Syncope- No medication changes noted- patient instructed to follow-up in 12 months  Hospital visits: None in previous 6 months  Objective:  Lab Results  Component Value Date   CREATININE 1.4 (A) 07/16/2020   BUN 28 (H) 05/20/2020   GFRNONAA 33 (L) 09/21/2019   GFRAA 43 07/16/2020   NA 137 05/20/2020   K 4.5 07/16/2020    CALCIUM 9.1 07/16/2020   CO2 22 05/20/2020   GLUCOSE 127 (H) 05/20/2020    Lab Results  Component Value Date/Time   HGBA1C 6.4 (A) 11/20/2020 09:50 AM   HGBA1C 6.0 (A) 05/20/2020 01:37 PM   HGBA1C 6.5 (H) 03/19/2019 10:53 AM   HGBA1C 6.1 (H) 05/04/2018 09:34 AM   MICROALBUR 50 08/31/2016 09:04 AM    Last diabetic Eye exam:  Lab Results  Component Value Date/Time   HMDIABEYEEXA No Retinopathy 09/19/2020 12:00 AM    Last diabetic Foot exam: No results found for: HMDIABFOOTEX   Lab Results  Component Value Date   CHOL 236 (H) 05/20/2020   HDL 74 05/20/2020   LDLCALC 143 (H) 05/20/2020   TRIG 107 05/20/2020   CHOLHDL 3.2 05/20/2020    Hepatic Function Latest Ref Rng & Units 05/20/2020 09/21/2019 06/18/2019  Total Protein 6.0 - 8.5 g/dL 6.7 - -  Albumin 3.7 - 4.7 g/dL 4.1 4.1 4.4  AST 0 - 40 IU/L 19 - -  ALT 0 - 32 IU/L 23 - -  Alk Phosphatase 44 - 121 IU/L 65 - -  Total Bilirubin 0.0 - 1.2 mg/dL <1.5 - -    Lab Results  Component Value Date/Time   TSH 0.999 07/22/2020 09:06 AM   TSH 1.320 03/03/2017 10:13 AM    CBC Latest Ref Rng & Units 07/22/2020 07/16/2020 06/18/2019  WBC 3.4 - 10.8 x10E3/uL 4.9 - 4.9  Hemoglobin 11.1 - 15.9 g/dL 10.4(L) 10.6(A) 11.3  Hematocrit 34.0 - 46.6 % 31.6(L) 32(A) 34.3  Platelets 150 - 450 x10E3/uL 250 246 213    Lab Results  Component Value Date/Time   VD25OH 34.8 05/20/2020 01:44  PM   VD25OH 26.9 (L) 03/19/2019 10:53 AM    Clinical ASCVD: No  The 10-year ASCVD risk score (Arnett DK, et al., 2019) is: 52.1%   Values used to calculate the score:     Age: 81 years     Sex: Female     Is Non-Hispanic African American: Yes     Diabetic: Yes     Tobacco smoker: No     Systolic Blood Pressure: 562 mmHg     Is BP treated: Yes     HDL Cholesterol: 74 mg/dL     Total Cholesterol: 236 mg/dL    Depression screen [facility] 2/9 05/20/2020 04/09/2020 03/14/2019  Decreased Interest 0 0 0  Down, Depressed, Hopeless 0 1 0  PHQ - 2 Score 0 1 0   Altered sleeping 0 - -  Tired, decreased energy 3 - -  Change in appetite 0 - -  Feeling bad or failure about yourself  0 - -  Trouble concentrating 0 - -  Moving slowly or fidgety/restless 0 - -  Suicidal thoughts 0 - -  PHQ-9 Score 3 - -  Difficult doing work/chores Extremely dIfficult - -    Social History   Tobacco Use  Smoking Status Never  Smokeless Tobacco Never   BP Readings from Last 3 Encounters:  11/20/20 136/76  08/26/20 140/82  07/22/20 (!) 152/80   Pulse Readings from Last 3 Encounters:  11/20/20 67  08/26/20 68  07/22/20 67   Wt Readings from Last 3 Encounters:  11/20/20 195 lb 8 oz (88.7 kg)  08/26/20 189 lb 4 oz (85.8 kg)  07/22/20 187 lb (84.8 kg)   BMI Readings from Last 3 Encounters:  11/20/20 38.18 kg/m  08/26/20 34.61 kg/m  07/22/20 34.20 kg/m    Assessment/Interventions: Review of patient past medical history, allergies, medications, health status, including review of consultants reports, laboratory and other test data, was performed as part of comprehensive evaluation and provision of chronic care management services.   SDOH:  (Social Determinants of Health) assessments and interventions performed: Yes SDOH Interventions    Flowsheet Row Most Recent Value  SDOH Interventions   Financial Strain Interventions Intervention Not Indicated       SDOH Screenings   Alcohol Screen: Low Risk    Last Alcohol Screening Score (AUDIT): 2  Depression (PHQ2-9): Low Risk    PHQ-2 Score: 3  Financial Resource Strain: Low Risk    Difficulty of Paying Living Expenses: Not hard at all  Food Insecurity: No Food Insecurity   Worried About Charity fundraiser in the Last Year: Never true   Ran Out of Food in the Last Year: Never true  Housing: Low Risk    Last Housing Risk Score: 0  Physical Activity: Inactive   Days of Exercise per Week: 0 days   Minutes of Exercise per Session: 0 min  Social Connections: Moderately Isolated   Frequency of  Communication with Friends and Family: More than three times a week   Frequency of Social Gatherings with Friends and Family: More than three times a week   Attends Religious Services: More than 4 times per year   Active Member of [employer] or Organizations: No   Attends Archivist Meetings: Never   Marital Status: Widowed  Stress: No Stress Concern Present   Feeling of Stress : Only a little  Tobacco Use: Low Risk    Smoking Tobacco Use: Never   Smokeless Tobacco Use: Never   Passive Exposure:  Not on file  Transportation Needs: No Transportation Needs   Lack of Transportation (Medical): No   Lack of Transportation (Non-Medical): No    CCM Care Plan  Allergies  Allergen Reactions   Clonidine Derivatives Other (See Comments)    Medications Reviewed Today     Reviewed by Germaine Pomfret, [facility] (Pharmacist) on 01/27/21 at [phone]  Med List Status: <None>   Medication Order Taking? Sig Documenting Provider Last Dose Status Informant  acetaminophen (TYLENOL) 325 MG tablet [id number] Yes Take 650 mg by mouth every 6 (six) hours as needed. [provider]  Active   amLODipine (NORVASC) 5 MG tablet [id number]  Take 1 tablet (5 mg total) by mouth daily. Minna Merritts, MD  Active   Blood Pressure KIT [id number]  Use to check blood pressure as directed Virginia Crews, MD  Active   cholecalciferol (VITAMIN D) 1000 UNITS tablet [id number]  Take 1,000 Units by mouth daily. Pt taking 2000 daily [provider]  Active Self           Med Note Rolena Infante, TIFFANY N   Sat Jan 25, 2015  5:50 PM)    Coenzyme Q10 (CO Q-10) 200 MG CAPS [id number]  Take 200 mg by mouth daily. [provider]  Active   doxazosin (CARDURA) 2 MG tablet [id number]  Take 1 tablet (2 mg total) by mouth daily. Virginia Crews, MD  Active   ezetimibe (ZETIA) 10 MG tablet [id number]  Take 1 tablet (10 mg total) by mouth daily.  Patient taking differently: Take 10 mg by mouth 2 (two) times a  week.   Minna Merritts, MD  Active   ferrous sulfate 325 (65 FE) MG tablet [id number]  TAKE 1 TABLET BY MOUTH EVERY DAY Mar Daring, PA-C  Active   losartan (COZAAR) 100 MG tablet [id number]  Take 1 tablet (100 mg total) by mouth daily. Minna Merritts, MD  Active   metFORMIN (GLUCOPHAGE) 500 MG tablet [id number]  Take 1 tablet (500 mg total) by mouth daily with breakfast. Mar Daring, PA-C  Active   metoprolol succinate (TOPROL-XL) 50 MG 24 hr tablet [id number]  Take 1 tablet (50 mg total) by mouth daily. TAKE WITH OR IMMEDIATELY FOLLOWING A MEAL. Virginia Crews, MD  Active   omeprazole (PRILOSEC) 40 MG capsule [id number]  TAKE 1 CAPSULE BY MOUTH EVERY DAY Bacigalupo, Dionne Bucy, MD  Active   torsemide (DEMADEX) 10 MG tablet [id number]  Take 1 tablet (10 mg total) by mouth every morning. Minna Merritts, MD  Active             Patient Active Problem List   Diagnosis Date Noted   Hypertension associated with diabetes ([place]) 11/20/2020   Myalgia due to statin 05/20/2020   Epigastric hernia 06/21/2019   Diastasis recti 06/21/2019   Obesity 06/21/2019   Bilateral inguinal hernia 17/00/1749   Umbilical hernia without obstruction and without gangrene 06/21/2019   CKD stage 3 due to type 2 diabetes mellitus ([place]) 06/18/2019   Low back pain 07/11/2015   Acute blood loss anemia 01/25/2015   Acid reflux 05/30/2014   Burning or prickling sensation 05/30/2014   Deficiency of vitamin B 07/05/2008   Anemia, deficiency 06/26/2008   Avitaminosis D 06/26/2008   Benign hypertension with CKD (chronic kidney disease) stage III ([place]) 06/15/2008   Hyperlipidemia associated with type 2 diabetes mellitus ([place]) 06/12/2008   T2DM (type 2 diabetes mellitus) ([place]) 06/06/2008   Colon, diverticulosis 06/06/2008  Apnea, sleep 01/26/2007   History of colon polyps 08/09/2006    Immunization History  Administered Date(s) Administered   Moderna Sars-Covid-2 Vaccination 02/06/2019,  03/06/2019, 11/28/2019   Pneumococcal Conjugate-13 10/03/2013   Pneumococcal Polysaccharide-23 03/25/2011    Conditions to be addressed/monitored:  Hypertension, Hyperlipidemia, Diabetes, GERD, and Chronic Kidney Disease  Care Plan : General Pharmacy (Adult)  Updates made by Germaine Pomfret, RPH since 01/27/2021 12:00 AM     Problem: Hypertension, Hyperlipidemia, Diabetes, GERD, and Chronic Kidney Disease   Priority: High     Long-Range Goal: Patient-Specific Goal   Start Date: 10/31/2020  Expected End Date: 10/31/2021  This Visit's Progress: On track  Recent Progress: On track  Priority: High  Note:   Current Barriers:  Unable to independently monitor therapeutic efficacy  Pharmacist Clinical Goal(s):  Patient will maintain control of blood pressure as evidenced by BP less than 140/90  through collaboration with PharmD and provider.   Interventions: 1:1 collaboration with Virginia Crews, MD regarding development and update of comprehensive plan of care as evidenced by provider attestation and co-signature Inter-disciplinary care team collaboration (see longitudinal plan of care) Comprehensive medication review performed; medication list updated in electronic medical record  Hypertension (BP goal <140/90) -Controlled -Current treatment: Amlodipine 5 mg daily (HS) Doxazosin 2 mg daily  Losartan 100 mg daily  Metoprolol XL 50 mg daily (HS) Torsemide 10 mg daily  -Medications previously tried: NA  -Current home readings: 482/707E systolic (with wrist cuff)  -Denies hypotensive/hypertensive symptoms -Recommended to continue current medication  Hyperlipidemia: (LDL goal < 100) -Uncontrolled -Current treatment: Ezetimibe 10 mg daily (only takes twice weekly) -Medications previously tried: Pravastatin, Simvastatin,   -Joint pain which she associated with her cholesterol medication. Only takes twice weekly to limit pain. Coq10 supplementation did not improve  symptoms.  -Counseled on use of acetaminophen to help with joint pain.  -Recommended to continue current medication  Diabetes (A1c goal <6.5%) -Controlled -Current medications: Metformin 500 mg daily  -Medications previously tried: NA  -Does not monitor blood sugars at home.  -Recommended to continue current medication  Chronic Kidney Disease Stage 3b  -All medications assessed for renal dosing and appropriateness in chronic kidney disease. -Recommended to continue current medication  Patient Goals/Self-Care Activities Patient will:  - check blood pressure 2-3 times weekly, document, and provide at future appointments  Follow Up Plan: Telephone follow up appointment with care management team member scheduled for:  07/27/2021 at 8:30 AM    Medication Assistance: None required.  Patient affirms current coverage meets needs.  Compliance/Adherence/Medication fill history: Care Gaps: Zoster Vaccines Diabetic Foot Exam COVID-19 Booster 4 Influenza Vaccine  Star-Rating Drugs: Losartan 100 mg  last filled on 08/17/2020 for a 90-Day supply with [facility] Metformin 500 mg last filled on 09/12/2020 for a 90-Day supply with [facility]  Patient's [facility] pharmacy is:  [id number] [place], [place] - [address] [place] [address] Phone: [phone] Fax: [phone]  Uses pill box? Yes Pt endorses 100% compliance  We discussed: Current pharmacy is preferred with insurance plan and patient is satisfied with pharmacy services Patient decided to: Continue current medication management strategy  Care Plan and Follow Up Patient Decision:  Patient agrees to Care Plan and Follow-up.  Plan: Telephone follow up appointment with care management team member scheduled for:  07/27/2021 at 8:30 AM  [name], PharmD, Para [place], [place] [phone]

## 2021-01-27 NOTE — Patient Instructions (Signed)
Visit Information It was great speaking with you today!  Please let me know if you have any questions about our visit.   Goals Addressed             This Visit's Progress    Track and Manage My Blood Pressure-Hypertension   On track    Timeframe:  Long-Range Goal Priority:  High Start Date: 10/31/2020                             Expected End Date: 10/31/2021                      Follow Up within 90 days   - check blood pressure 3 times per week - write blood pressure results in a log or diary    Why is this important?   You won't feel high blood pressure, but it can still hurt your blood vessels.  High blood pressure can cause heart or kidney problems. It can also cause a stroke.  Making lifestyle changes like losing a little weight or eating less salt will help.  Checking your blood pressure at home and at different times of the day can help to control blood pressure.  If the doctor prescribes medicine remember to take it the way the doctor ordered.  Call the office if you cannot afford the medicine or if there are questions about it.     Notes:         Patient Care Plan: General Pharmacy (Adult)     Problem Identified: Hypertension, Hyperlipidemia, Diabetes, GERD, and Chronic Kidney Disease   Priority: High     Long-Range Goal: Patient-Specific Goal   Start Date: 10/31/2020  Expected End Date: 10/31/2021  This Visit's Progress: On track  Recent Progress: On track  Priority: High  Note:   Current Barriers:  Unable to independently monitor therapeutic efficacy  Pharmacist Clinical Goal(s):  Patient will maintain control of blood pressure as evidenced by BP less than 140/90  through collaboration with PharmD and provider.   Interventions: 1:1 collaboration with Virginia Crews, MD regarding development and update of comprehensive plan of care as evidenced by provider attestation and co-signature Inter-disciplinary care team collaboration (see longitudinal plan  of care) Comprehensive medication review performed; medication list updated in electronic medical record  Hypertension (BP goal <140/90) -Controlled -Current treatment: Amlodipine 5 mg daily (HS) Doxazosin 2 mg daily  Losartan 100 mg daily  Metoprolol XL 50 mg daily (HS) Torsemide 10 mg daily  -Medications previously tried: NA  -Current home readings: 914/782N systolic (with wrist cuff)  -Denies hypotensive/hypertensive symptoms -Recommended to continue current medication  Hyperlipidemia: (LDL goal < 100) -Uncontrolled -Current treatment: Ezetimibe 10 mg daily (only takes twice weekly) -Medications previously tried: Pravastatin, Simvastatin,   -Joint pain which she associated with her cholesterol medication. Only takes twice weekly to limit pain. Coq10 supplementation did not improve symptoms.  -Counseled on use of acetaminophen to help with joint pain.  -Recommended to continue current medication  Diabetes (A1c goal <6.5%) -Controlled -Current medications: Metformin 500 mg daily  -Medications previously tried: NA  -Does not monitor blood sugars at home.  -Recommended to continue current medication  Chronic Kidney Disease Stage 3b  -All medications assessed for renal dosing and appropriateness in chronic kidney disease. -Recommended to continue current medication  Patient Goals/Self-Care Activities Patient will:  - check blood pressure 2-3 times weekly, document, and provide at future appointments  Follow Up Plan: Telephone follow up appointment with care management team member scheduled for:  07/27/2021 at 8:30 AM    Patient agreed to services and verbal consent obtained.   The patient verbalized understanding of instructions, educational materials, and care plan provided today and declined offer to receive copy of patient instructions, educational materials, and care plan.   [name], PharmD, [place], CPP  Clinical Pharmacist Practitioner  [facility] [phone]

## 2021-02-10 ENCOUNTER — Other Ambulatory Visit: Payer: Self-pay | Admitting: Family Medicine

## 2021-02-10 DIAGNOSIS — I129 Hypertensive chronic kidney disease with stage 1 through stage 4 chronic kidney disease, or unspecified chronic kidney disease: Secondary | ICD-10-CM

## 2021-02-24 DIAGNOSIS — I152 Hypertension secondary to endocrine disorders: Secondary | ICD-10-CM | POA: Diagnosis not present

## 2021-02-24 DIAGNOSIS — E1159 Type 2 diabetes mellitus with other circulatory complications: Secondary | ICD-10-CM

## 2021-02-24 DIAGNOSIS — E1169 Type 2 diabetes mellitus with other specified complication: Secondary | ICD-10-CM | POA: Diagnosis not present

## 2021-02-24 DIAGNOSIS — E785 Hyperlipidemia, unspecified: Secondary | ICD-10-CM | POA: Diagnosis not present

## 2021-03-09 ENCOUNTER — Other Ambulatory Visit: Payer: Self-pay | Admitting: Physician Assistant

## 2021-03-09 DIAGNOSIS — N184 Chronic kidney disease, stage 4 (severe): Secondary | ICD-10-CM

## 2021-03-13 NOTE — Telephone Encounter (Signed)
Requested Prescriptions  Pending Prescriptions Disp Refills   metFORMIN (GLUCOPHAGE) 500 MG tablet [Pharmacy Med Name: METFORMIN HCL 500 MG TABLET] 90 tablet 3    Sig: TAKE 1 TABLET BY MOUTH EVERY DAY WITH BREAKFAST     Endocrinology:  Diabetes - Biguanides Failed - 03/13/2021  9:21 AM      Failed - Cr in normal range and within 360 days    Creatinine  Date Value Ref Range Status  07/16/2020 1.4 (A) 0.5 - 1.1 Final   Creatinine, Ser  Date Value Ref Range Status  05/20/2020 1.76 (H) 0.57 - 1.00 mg/dL Final         Failed - eGFR in normal range and within 360 days    GFR calc Af Amer  Date Value Ref Range Status  07/16/2020 43  Final   GFR calc non Af Amer  Date Value Ref Range Status  09/21/2019 33 (L) >59 mL/min/1.73 Final   eGFR  Date Value Ref Range Status  05/20/2020 29 (L) >59 mL/min/1.73 Final         Failed - B12 Level in normal range and within 720 days    Vitamin B-12  Date Value Ref Range Status  03/03/2017 455 232 - 1,245 pg/mL Final         Passed - HBA1C is between 0 and 7.9 and within 180 days    Hemoglobin A1C  Date Value Ref Range Status  11/20/2020 6.4 (A) 4.0 - 5.6 % Final   Hgb A1c MFr Bld  Date Value Ref Range Status  03/19/2019 6.5 (H) 4.8 - 5.6 % Final    Comment:             Prediabetes: 5.7 - 6.4          Diabetes: >6.4          Glycemic control for adults with diabetes: <7.0          Passed - Valid encounter within last 6 months    Recent Outpatient Visits          3 months ago Hypertension associated with diabetes [facility])   [facility] [place], Dionne Bucy, MD   7 months ago Syncope, unspecified syncope type   [facility], Dionne Bucy, MD   9 months ago Benign hypertension with CKD (chronic kidney disease) stage III [facility])   [facility] Bacigalupo, Dionne Bucy, MD   1 year ago Resistant hypertension   [place], [place], PA-C   1 year ago Type 2 diabetes  mellitus with stage 4 chronic kidney disease, without long-term current use of insulin ([place])   [place], Clearnce Sorrel, [place]      Future Appointments            In 1 month Bacigalupo, Dionne Bucy, MD [facility]           Passed - CBC within normal limits and completed in the last 12 months    WBC  Date Value Ref Range Status  07/22/2020 4.9 3.4 - 10.8 x10E3/uL Final  04/01/2018 8.9 4.0 - 10.5 K/uL Final   RBC  Date Value Ref Range Status  07/22/2020 3.57 (L) 3.77 - 5.28 x10E6/uL Final  07/16/2020 3.63 (A) 3.87 - 5.11 Final   Hemoglobin  Date Value Ref Range Status  07/22/2020 10.4 (L) 11.1 - 15.9 g/dL Final   Hematocrit  Date Value Ref Range Status  07/22/2020 31.6 (L) 34.0 - 46.6 %  Final   MCHC  Date Value Ref Range Status  07/22/2020 32.9 31.5 - 35.7 g/dL Final  04/01/2018 32.8 30.0 - 36.0 g/dL Final   [facility]  Date Value Ref Range Status  07/22/2020 29.1 26.6 - 33.0 pg Final  04/01/2018 28.6 26.0 - 34.0 pg Final   MCV  Date Value Ref Range Status  07/22/2020 89 79 - 97 fL Final   No results found for: PLTCOUNTKUC, LABPLAT, POCPLA RDW  Date Value Ref Range Status  07/22/2020 12.5 11.7 - 15.4 % Final

## 2021-03-13 NOTE — Telephone Encounter (Signed)
Pt following up on refill request for Metformin. She is down to 2 pills. Pt states she has never had a problem in the past getting her medication.  Would [facility] sent to   [id number] - [place], [place] - [address]  It appears this is sitting in jennifer burnette refill Pool since 2/13

## 2021-03-14 ENCOUNTER — Other Ambulatory Visit: Payer: Self-pay | Admitting: Physician Assistant

## 2021-03-14 DIAGNOSIS — I10 Essential (primary) hypertension: Secondary | ICD-10-CM

## 2021-04-16 NOTE — Progress Notes (Signed)
? ? ?I,Miranda Olsen,acting as a scribe for Lavon Paganini, MD.,have documented all relevant documentation on the behalf of Lavon Paganini, MD,as directed by  Lavon Paganini, MD while in the presence of Lavon Paganini, MD. ? ? ?Annual Wellness Visit ? ?  ? ?Patient: Miranda Olsen, Female    DOB: [birth year], 81 y.o.   MRN: [id number] ?Visit Date: 04/17/2021 ? ?Today's Provider: Lavon Paganini, MD  ? ?Chief Complaint  ?Patient presents with  ? Medicare Wellness  ? ?Subjective  ?  ?Miranda Olsen is a 81 y.o. female who presents today for her Annual Wellness Visit. ?She reports consuming a general diet. Home exercise routine includes walking 1 hrs per week. She generally feels well. She reports sleeping well. She does not have additional problems to discuss today.  ? ?HPI ? ?Having some tingling in both feet (mostly toes). Intermittently.  No pain ? ?Pain under L toenail for many months. Might be darker. ? ? ?Medications: ?Outpatient Medications Prior to Visit  ?Medication Sig  ? acetaminophen (TYLENOL) 325 MG tablet Take 650 mg by mouth every 6 (six) hours as needed.  ? amLODipine (NORVASC) 5 MG tablet TAKE 1 TABLET BY MOUTH EVERY DAY  ? Blood Pressure KIT Use to check blood pressure as directed  ? cholecalciferol (VITAMIN D) 1000 UNITS tablet Take 1,000 Units by mouth daily. Pt taking 2000 daily  ? doxazosin (CARDURA) 2 MG tablet Take 1 tablet (2 mg total) by mouth daily.  ? ezetimibe (ZETIA) 10 MG tablet Take 1 tablet (10 mg total) by mouth daily. (Patient taking differently: Take 10 mg by mouth daily. Takes 1-2 times a week only)  ? ferrous sulfate 325 (65 FE) MG tablet TAKE 1 TABLET BY MOUTH EVERY DAY  ? losartan (COZAAR) 100 MG tablet TAKE 1 TABLET BY MOUTH EVERY DAY  ? metFORMIN (GLUCOPHAGE) 500 MG tablet TAKE 1 TABLET BY MOUTH EVERY DAY WITH BREAKFAST  ? metoprolol succinate (TOPROL-XL) 50 MG 24 hr tablet Take 1 tablet (50 mg total) by mouth daily. TAKE WITH OR IMMEDIATELY FOLLOWING A  MEAL.  ? Omega-3 1000 MG CAPS Take 1 capsule by mouth daily.  ? omeprazole (PRILOSEC) 40 MG capsule TAKE 1 CAPSULE BY MOUTH EVERY DAY  ? torsemide (DEMADEX) 10 MG tablet Take 1 tablet (10 mg total) by mouth every morning.  ? [DISCONTINUED] Coenzyme Q10 (CO Q-10) 200 MG CAPS Take 200 mg by mouth daily. (Patient not taking: Reported on 04/17/2021)  ? ?No facility-administered medications prior to visit.  ?  ?Allergies  ?Allergen Reactions  ? Clonidine Derivatives Other (See Comments)  ? ? ?Patient Care Team: ?Virginia Crews, MD as PCP - General (Family Medicine) ?Birder Robson, MD as Referring Physician (Ophthalmology) ?Murlean Iba, MD (Nephrology) ?Germaine Pomfret, [facility] (Pharmacist) ?Minna Merritts, MD as Consulting Physician (Cardiology) ? ?Review of Systems  ?Musculoskeletal:  Positive for myalgias, neck pain and neck stiffness.  ?Neurological:  Positive for numbness.  ?All other systems reviewed and are negative. ? ?Last CBC ?Lab Results  ?Component Value Date  ? WBC 4.9 07/22/2020  ? HGB 10.4 (L) 07/22/2020  ? HCT 31.6 (L) 07/22/2020  ? MCV 89 07/22/2020  ? MCH 29.1 07/22/2020  ? RDW 12.5 07/22/2020  ? PLT 250 07/22/2020  ? ?Last metabolic panel ?Lab Results  ?Component Value Date  ? GLUCOSE 127 (H) 05/20/2020  ? NA 137 05/20/2020  ? K 4.5 07/16/2020  ? CL 102 05/20/2020  ? CO2 22 05/20/2020  ? BUN 28 (  H) 05/20/2020  ? CREATININE 1.4 (A) 07/16/2020  ? EGFR 29 (L) 05/20/2020  ? CALCIUM 9.1 07/16/2020  ? PHOS 3.8 09/21/2019  ? PROT 6.7 05/20/2020  ? ALBUMIN 4.1 05/20/2020  ? LABGLOB 2.6 05/20/2020  ? AGRATIO 1.6 05/20/2020  ? BILITOT <0.2 05/20/2020  ? ALKPHOS 65 05/20/2020  ? AST 19 05/20/2020  ? ALT 23 05/20/2020  ? ANIONGAP 10 04/01/2018  ? ?Last lipids ?Lab Results  ?Component Value Date  ? CHOL 236 (H) 05/20/2020  ? HDL 74 05/20/2020  ? LDLCALC 143 (H) 05/20/2020  ? TRIG 107 05/20/2020  ? CHOLHDL 3.2 05/20/2020  ? ?Last hemoglobin A1c ?Lab Results  ?Component Value Date  ? HGBA1C 6.4 (A)  11/20/2020  ? ?Last thyroid functions ?Lab Results  ?Component Value Date  ? TSH 0.999 07/22/2020  ? ?  ?  ? Objective  ?  ?Vitals: BP 135/74 (BP Location: Right Arm, Patient Position: Sitting, Cuff Size: Large)   Pulse 75   Temp 97.7 ?F (36.5 ?C) (Temporal)   Resp 16   Wt 202 lb 12.8 oz (92 kg)   SpO2 97%   BMI 39.61 kg/m?  ?BP Readings from Last 3 Encounters:  ?04/17/21 135/74  ?11/20/20 136/76  ?08/26/20 140/82  ? ?Wt Readings from Last 3 Encounters:  ?04/17/21 202 lb 12.8 oz (92 kg)  ?11/20/20 195 lb 8 oz (88.7 kg)  ?08/26/20 189 lb 4 oz (85.8 kg)  ? ?  ? ? ?Physical Exam ?Vitals reviewed.  ?Constitutional:   ?   General: She is not in acute distress. ?   Appearance: Normal appearance. She is well-developed. She is not diaphoretic.  ?HENT:  ?   Head: Normocephalic and atraumatic.  ?   Right Ear: Tympanic membrane, ear canal and external ear normal.  ?   Left Ear: Tympanic membrane, ear canal and external ear normal.  ?   Nose: Nose normal.  ?   Mouth/Throat:  ?   Mouth: Mucous membranes are moist.  ?   Pharynx: Oropharynx is clear. No oropharyngeal exudate.  ?Eyes:  ?   General: No scleral icterus. ?   Conjunctiva/sclera: Conjunctivae normal.  ?   Pupils: Pupils are equal, round, and reactive to light.  ?Neck:  ?   Thyroid: No thyromegaly.  ?Cardiovascular:  ?   Rate and Rhythm: Normal rate and regular rhythm.  ?   Pulses: Normal pulses.  ?   Heart sounds: Normal heart sounds. No murmur heard. ?Pulmonary:  ?   Effort: Pulmonary effort is normal. No respiratory distress.  ?   Breath sounds: Normal breath sounds. No wheezing, rhonchi or rales.  ?Abdominal:  ?   General: There is no distension.  ?   Palpations: Abdomen is soft.  ?   Tenderness: There is no abdominal tenderness.  ?Musculoskeletal:     ?   General: No deformity.  ?   Cervical back: Neck supple.  ?   Right lower leg: No edema.  ?   Left lower leg: No edema.  ?Lymphadenopathy:  ?   Cervical: No cervical adenopathy.  ?Skin: ?   General: Skin is  warm and dry.  ?   Findings: No rash.  ?   Comments: Ingrown L great toenail ?Monofilament sensation intact  ?Neurological:  ?   Mental Status: She is alert and oriented to person, place, and time. Mental status is at baseline.  ?   Gait: Gait normal.  ?Psychiatric:     ?   Mood and  Affect: Mood normal.     ?   Behavior: Behavior normal.     ?   Thought Content: Thought content normal.  ? ? ? ?Most recent functional status assessment: ? ?  04/17/2021  ?  8:41 AM  ?In your present state of health, do you have any difficulty performing the following activities:  ?Hearing? 1  ?Vision? 0  ?Difficulty concentrating or making decisions? 0  ?Walking or climbing stairs? 1  ?Dressing or bathing? 0  ?Doing errands, shopping? 0  ? ?Most recent fall risk assessment: ? ?  04/17/2021  ?  8:40 AM  ?Fall Risk   ?Falls in the past year? 0  ?Number falls in past yr: 0  ?Injury with Fall? 0  ?Risk for fall due to : No Fall Risks  ?Follow up Falls evaluation completed  ? ? Most recent depression screenings: ? ?  04/17/2021  ?  8:41 AM 05/20/2020  ?  1:29 PM  ?PHQ 2/9 Scores  ?PHQ - 2 Score 0 0  ?PHQ- 9 Score 1 3  ? ?Most recent cognitive screening: ? ?  04/17/2021  ?  8:43 AM  ?6CIT Screen  ?What Year? 0 points  ?What month? 0 points  ?What time? 0 points  ?Count back from 20 0 points  ?Months in reverse 0 points  ?Repeat phrase 0 points  ?Total Score 0 points  ? ?Most recent Audit-C alcohol use screening ? ?  04/17/2021  ?  8:41 AM  ?Alcohol Use Disorder Test (AUDIT)  ?1. How often do you have a drink containing alcohol? 0  ?2. How many drinks containing alcohol do you have on a typical day when you are drinking? 0  ?3. How often do you have six or more drinks on one occasion? 0  ?AUDIT-C Score 0  ? ?A score of 3 or more in women, and 4 or more in men indicates increased risk for alcohol abuse, EXCEPT if all of the points are from question 1  ? ?No results found for any visits on 04/17/21. ? Assessment & Plan  ?  ? ?Annual wellness visit  done today including the all of the following: ?Reviewed patient's Family Medical History ?Reviewed and updated list of patient's medical providers ?Assessment of cognitive impairment was done ?Assessed patient's func

## 2021-04-17 ENCOUNTER — Other Ambulatory Visit: Payer: Self-pay

## 2021-04-17 ENCOUNTER — Encounter: Payer: Self-pay | Admitting: Family Medicine

## 2021-04-17 ENCOUNTER — Ambulatory Visit (INDEPENDENT_AMBULATORY_CARE_PROVIDER_SITE_OTHER): Payer: Medicare Other | Admitting: Family Medicine

## 2021-04-17 VITALS — BP 135/74 | HR 75 | Temp 97.7°F | Resp 16 | Wt 202.8 lb

## 2021-04-17 DIAGNOSIS — E1159 Type 2 diabetes mellitus with other circulatory complications: Secondary | ICD-10-CM

## 2021-04-17 DIAGNOSIS — Z6839 Body mass index (BMI) 39.0-39.9, adult: Secondary | ICD-10-CM

## 2021-04-17 DIAGNOSIS — L600 Ingrowing nail: Secondary | ICD-10-CM

## 2021-04-17 DIAGNOSIS — D539 Nutritional anemia, unspecified: Secondary | ICD-10-CM | POA: Diagnosis not present

## 2021-04-17 DIAGNOSIS — E1169 Type 2 diabetes mellitus with other specified complication: Secondary | ICD-10-CM

## 2021-04-17 DIAGNOSIS — Z0000 Encounter for general adult medical examination without abnormal findings: Secondary | ICD-10-CM

## 2021-04-17 DIAGNOSIS — I152 Hypertension secondary to endocrine disorders: Secondary | ICD-10-CM

## 2021-04-17 DIAGNOSIS — T466X5A Adverse effect of antihyperlipidemic and antiarteriosclerotic drugs, initial encounter: Secondary | ICD-10-CM

## 2021-04-17 DIAGNOSIS — I129 Hypertensive chronic kidney disease with stage 1 through stage 4 chronic kidney disease, or unspecified chronic kidney disease: Secondary | ICD-10-CM | POA: Diagnosis not present

## 2021-04-17 DIAGNOSIS — M791 Myalgia, unspecified site: Secondary | ICD-10-CM | POA: Diagnosis not present

## 2021-04-17 DIAGNOSIS — E119 Type 2 diabetes mellitus without complications: Secondary | ICD-10-CM

## 2021-04-17 DIAGNOSIS — E1122 Type 2 diabetes mellitus with diabetic chronic kidney disease: Secondary | ICD-10-CM | POA: Diagnosis not present

## 2021-04-17 DIAGNOSIS — E66812 Obesity, class 2: Secondary | ICD-10-CM

## 2021-04-17 DIAGNOSIS — R202 Paresthesia of skin: Secondary | ICD-10-CM | POA: Diagnosis not present

## 2021-04-17 DIAGNOSIS — N1832 Chronic kidney disease, stage 3b: Secondary | ICD-10-CM

## 2021-04-17 DIAGNOSIS — E785 Hyperlipidemia, unspecified: Secondary | ICD-10-CM

## 2021-04-17 DIAGNOSIS — N183 Chronic kidney disease, stage 3 unspecified: Secondary | ICD-10-CM

## 2021-04-17 DIAGNOSIS — Z79899 Other long term (current) drug therapy: Secondary | ICD-10-CM | POA: Diagnosis not present

## 2021-04-17 DIAGNOSIS — E559 Vitamin D deficiency, unspecified: Secondary | ICD-10-CM

## 2021-04-17 DIAGNOSIS — Z1231 Encounter for screening mammogram for malignant neoplasm of breast: Secondary | ICD-10-CM

## 2021-04-17 NOTE — Assessment & Plan Note (Signed)
Chronic and stable Recheck metabolic panel Avoid nephrotoxic meds  

## 2021-04-17 NOTE — Assessment & Plan Note (Signed)
Bilateral feet ?Monofilament intact  ?Check B12 to see if this contributes vs diabetic neuropathy ?

## 2021-04-17 NOTE — Assessment & Plan Note (Signed)
Well controlled Continue current medications Recheck metabolic panel F/u in 6 months  

## 2021-04-17 NOTE — Assessment & Plan Note (Signed)
Recheck CBC and iron panel ?

## 2021-04-17 NOTE — Patient Instructions (Signed)
Consider TDAP (tetanus shot) and Shingrix (shingles vaccines) at the pharmacy ?

## 2021-04-17 NOTE — Assessment & Plan Note (Signed)
Unable to tolerate statins ?Continue Zetia ?

## 2021-04-17 NOTE — Assessment & Plan Note (Signed)
Previously Well controlled  ?Recheck a1c ?Continue current medications ?UTD on vaccines, eye exam, foot exam ?On ACEi/ARB ?uACR today ?Discussed diet and exercise ?F/u in 6 months  ?

## 2021-04-17 NOTE — Assessment & Plan Note (Signed)
Discussed importance of healthy weight management Discussed diet and exercise  

## 2021-04-17 NOTE — Assessment & Plan Note (Signed)
Continue supplement Recheck level 

## 2021-04-17 NOTE — Assessment & Plan Note (Signed)
Previously well controlled ?Unable to tolerate statin 2/2 myalgias ?Continue Zetia twice weekly ?Repeat FLP and CMP ?Goal LDL < 70  ?

## 2021-04-19 LAB — COMPREHENSIVE METABOLIC PANEL
ALT: 9 IU/L (ref 0–32)
AST: 10 IU/L (ref 0–40)
Albumin/Globulin Ratio: 1.5 (ref 1.2–2.2)
Albumin: 4 g/dL (ref 3.7–4.7)
Alkaline Phosphatase: 57 IU/L (ref 44–121)
BUN/Creatinine Ratio: 15 (ref 12–28)
BUN: 25 mg/dL (ref 8–27)
Bilirubin Total: 0.4 mg/dL (ref 0.0–1.2)
CO2: 24 mmol/L (ref 20–29)
Calcium: 9.4 mg/dL (ref 8.7–10.3)
Chloride: 100 mmol/L (ref 96–106)
Creatinine, Ser: 1.69 mg/dL — ABNORMAL HIGH (ref 0.57–1.00)
Globulin, Total: 2.6 g/dL (ref 1.5–4.5)
Glucose: 107 mg/dL — ABNORMAL HIGH (ref 70–99)
Potassium: 4.9 mmol/L (ref 3.5–5.2)
Sodium: 136 mmol/L (ref 134–144)
Total Protein: 6.6 g/dL (ref 6.0–8.5)
eGFR: 31 mL/min/{1.73_m2} — ABNORMAL LOW (ref >59)

## 2021-04-19 LAB — MICROALBUMIN / CREATININE URINE RATIO
Creatinine, Urine: 151.6 mg/dL
Microalb/Creat Ratio: <2 mg/g creat (ref 0–29)
Microalbumin, Urine: <3 ug/mL

## 2021-04-19 LAB — CBC
Hematocrit: 32.5 % — ABNORMAL LOW (ref 34.0–46.6)
Hemoglobin: 10.5 g/dL — ABNORMAL LOW (ref 11.1–15.9)
MCH: 28.6 pg (ref 26.6–33.0)
MCHC: 32.3 g/dL (ref 31.5–35.7)
MCV: 89 fL (ref 79–97)
Platelets: 233 10*3/uL (ref 150–450)
RBC: 3.67 x10E6/uL — ABNORMAL LOW (ref 3.77–5.28)
RDW: 12.5 % (ref 11.7–15.4)
WBC: 6.4 10*3/uL (ref 3.4–10.8)

## 2021-04-19 LAB — LIPID PANEL WITH LDL/HDL RATIO
Cholesterol, Total: 301 mg/dL — ABNORMAL HIGH (ref 100–199)
HDL: 74 mg/dL (ref >39)
LDL Chol Calc (NIH): 215 mg/dL — ABNORMAL HIGH (ref 0–99)
LDL/HDL Ratio: 2.9 ratio (ref 0.0–3.2)
Triglycerides: 76 mg/dL (ref 0–149)
VLDL Cholesterol Cal: 12 mg/dL (ref 5–40)

## 2021-04-19 LAB — IRON,TIBC AND FERRITIN PANEL
Ferritin: 205 ng/mL — ABNORMAL HIGH (ref 15–150)
Iron Saturation: 20 % (ref 15–55)
Iron: 41 ug/dL (ref 27–139)
Total Iron Binding Capacity: 207 ug/dL — ABNORMAL LOW (ref 250–450)
UIBC: 166 ug/dL (ref 118–369)

## 2021-04-19 LAB — HEMOGLOBIN A1C
Est. average glucose Bld gHb Est-mCnc: 140 mg/dL
Hgb A1c MFr Bld: 6.5 % — ABNORMAL HIGH (ref 4.8–5.6)

## 2021-04-19 LAB — VITAMIN D 25 HYDROXY (VIT D DEFICIENCY, FRACTURES): Vit D, 25-Hydroxy: 41.2 ng/mL (ref 30.0–100.0)

## 2021-04-19 LAB — VITAMIN B12: Vitamin B-12: 200 pg/mL — ABNORMAL LOW (ref 232–1245)

## 2021-04-20 NOTE — Progress Notes (Signed)
I do remember discussing that. Please d/c Zetia from med list.  Please have her increase to 2000 units of Omega 3 daily also. Thanks!

## 2021-04-23 ENCOUNTER — Ambulatory Visit (INDEPENDENT_AMBULATORY_CARE_PROVIDER_SITE_OTHER): Payer: Medicare Other | Admitting: Podiatry

## 2021-04-23 DIAGNOSIS — L89891 Pressure ulcer of other site, stage 1: Secondary | ICD-10-CM

## 2021-04-28 ENCOUNTER — Telehealth: Payer: Self-pay | Admitting: Family Medicine

## 2021-04-28 NOTE — Telephone Encounter (Signed)
Pt states no one told her the appt she had scheduled today was cancelled. She was ready to come to office, and called to find out what time. ?Pt had her AWV on 04/17/21 w/ Dr B. ?Pt would appreciate if you could report this information to [facility].  She states she gets rewards for getting this done. ?

## 2021-04-29 NOTE — Progress Notes (Signed)
?Subjective:  ?Patient ID: Miranda Olsen, female    DOB: April 13, 1940,  MRN: [id number] ? ?Chief Complaint  ?Patient presents with  ? Ingrown Toenail  ? ? ?81 y.o. female presents with the above complaint.  Patient presents with complaint of left hallux distal tip pressure/pain.  Patient has been going for quite some time is progressive gotten worse.  Is painful to touch painful to walk on.  She wears regular shoes that are narrow on the toe box.  She wanted get evaluated she has not seen anyone as prior to seeing me.  Pain scale is 5 out of 10 dull achy in nature especially after walking..  She has not tried things at home that has not helped.  She tried soaking it for ? ? ?Review of Systems: Negative except as noted in the HPI. Denies N/V/F/Ch. ? ?Past Medical History:  ?Diagnosis Date  ? Diabetes mellitus without complication ([place])   ? Hypercholesterolemia   ? Hypertension   ? ? ?Current Outpatient Medications:  ?  acetaminophen (TYLENOL) 325 MG tablet, Take 650 mg by mouth every 6 (six) hours as needed., Disp: , Rfl:  ?  amLODipine (NORVASC) 5 MG tablet, TAKE 1 TABLET BY MOUTH EVERY DAY, Disp: 90 tablet, Rfl: 3 ?  Blood Pressure KIT, Use to check blood pressure as directed, Disp: 1 kit, Rfl: 0 ?  cholecalciferol (VITAMIN D) 1000 UNITS tablet, Take 1,000 Units by mouth daily. Pt taking 2000 daily, Disp: , Rfl:  ?  doxazosin (CARDURA) 2 MG tablet, Take 1 tablet (2 mg total) by mouth daily., Disp: 90 tablet, Rfl: 2 ?  ezetimibe (ZETIA) 10 MG tablet, Take 1 tablet (10 mg total) by mouth daily. (Patient taking differently: Take 10 mg by mouth daily. Takes 1-2 times a week only), Disp: 90 tablet, Rfl: 3 ?  ferrous sulfate 325 (65 FE) MG tablet, TAKE 1 TABLET BY MOUTH EVERY DAY, Disp: 90 tablet, Rfl: 0 ?  losartan (COZAAR) 100 MG tablet, TAKE 1 TABLET BY MOUTH EVERY DAY, Disp: 90 tablet, Rfl: 1 ?  metFORMIN (GLUCOPHAGE) 500 MG tablet, TAKE 1 TABLET BY MOUTH EVERY DAY WITH BREAKFAST, Disp: 90 tablet, Rfl: 0 ?   metoprolol succinate (TOPROL-XL) 50 MG 24 hr tablet, Take 1 tablet (50 mg total) by mouth daily. TAKE WITH OR IMMEDIATELY FOLLOWING A MEAL., Disp: 90 tablet, Rfl: 0 ?  Omega-3 1000 MG CAPS, Take 1 capsule by mouth daily., Disp: , Rfl:  ?  omeprazole (PRILOSEC) 40 MG capsule, TAKE 1 CAPSULE BY MOUTH EVERY DAY, Disp: 90 capsule, Rfl: 1 ?  torsemide (DEMADEX) 10 MG tablet, Take 1 tablet (10 mg total) by mouth every morning., Disp: 90 tablet, Rfl: 3 ? ?Social History  ? ?Tobacco Use  ?Smoking Status Never  ?Smokeless Tobacco Never  ? ? ?Allergies  ?Allergen Reactions  ? Clonidine Derivatives Other (See Comments)  ? ?Objective:  ?There were no vitals filed for this visit. ?There is no height or weight on file to calculate BMI. ?Constitutional Well developed. ?Well nourished.  ?Vascular Dorsalis pedis pulses palpable bilaterally. ?Posterior tibial pulses palpable bilaterally. ?Capillary refill normal to all digits.  ?No cyanosis or clubbing noted. ?Pedal hair growth normal.  ?Neurologic Normal speech. ?Oriented to person, place, and time. ?Epicritic sensation to light touch grossly present bilaterally.  ?Dermatologic No distal tip hallux pressure injury without any breakdown of the skin.  Pain on palpation to the injury.  No open wounds or lesions noted.  No infectious presents noted  ?Orthopedic:  Normal joint ROM without pain or crepitus bilaterally. ?No visible deformities. ?No bony tenderness.  ? ?Radiographs: None ?Assessment:  ? ?1. Pressure injury of toe of left foot, stage 1   ? ?Plan:  ?Patient was evaluated and treated and all questions answered. ? ?Left distal hallux tip pressure injury without breakdown of the skin ?-All questions and concerns were discussed with the patient in extensive detail.  I discussed with her that patient is to go wider in the toe box and prevent pistoning in the toe box.  I discussed this patient in extensive detail she states understanding. ?-Also dispensed toe protector which will  help take the pressure away. ? ?No follow-ups on file. ?

## 2021-05-18 DIAGNOSIS — N1832 Chronic kidney disease, stage 3b: Secondary | ICD-10-CM | POA: Diagnosis not present

## 2021-05-18 DIAGNOSIS — E1122 Type 2 diabetes mellitus with diabetic chronic kidney disease: Secondary | ICD-10-CM | POA: Diagnosis not present

## 2021-05-18 DIAGNOSIS — I10 Essential (primary) hypertension: Secondary | ICD-10-CM | POA: Diagnosis not present

## 2021-05-18 DIAGNOSIS — R600 Localized edema: Secondary | ICD-10-CM | POA: Diagnosis not present

## 2021-06-02 ENCOUNTER — Ambulatory Visit: Payer: Medicare Other | Admitting: Podiatry

## 2021-06-05 ENCOUNTER — Other Ambulatory Visit: Payer: Self-pay | Admitting: Family Medicine

## 2021-06-05 DIAGNOSIS — N184 Chronic kidney disease, stage 4 (severe): Secondary | ICD-10-CM

## 2021-06-18 ENCOUNTER — Ambulatory Visit
Admission: RE | Admit: 2021-06-18 | Discharge: 2021-06-18 | Disposition: A | Discharge status: Home / Self Care | Payer: Medicare Other | Source: Ambulatory Visit | Attending: Family Medicine | Admitting: Family Medicine

## 2021-06-18 DIAGNOSIS — Z1231 Encounter for screening mammogram for malignant neoplasm of breast: Secondary | ICD-10-CM

## 2021-06-26 ENCOUNTER — Other Ambulatory Visit: Payer: Self-pay | Admitting: Family Medicine

## 2021-06-26 DIAGNOSIS — K219 Gastro-esophageal reflux disease without esophagitis: Secondary | ICD-10-CM

## 2021-06-26 DIAGNOSIS — L03011 Cellulitis of right finger: Secondary | ICD-10-CM | POA: Diagnosis not present

## 2021-06-29 NOTE — Telephone Encounter (Signed)
Requested Prescriptions  Pending Prescriptions Disp Refills  . omeprazole (PRILOSEC) 40 MG capsule [Pharmacy Med Name: OMEPRAZOLE DR 40 MG CAPSULE] 90 capsule 0    Sig: TAKE 1 CAPSULE BY MOUTH EVERY DAY     Gastroenterology: Proton Pump Inhibitors Passed - 06/26/2021  5:08 PM      Passed - Valid encounter within last 12 months    Recent Outpatient Visits          2 months ago Encounter for [employer] annual wellness exam   [facility] Virginia Crews, MD   7 months ago Hypertension associated with diabetes [facility])   [facility], Dionne Bucy, MD   11 months ago Syncope, unspecified syncope type   [facility], Dionne Bucy, MD   1 year ago Benign hypertension with CKD (chronic kidney disease) stage III [facility])   [facility], Dionne Bucy, MD   1 year ago Resistant hypertension   [place], Clearnce Sorrel, [place]      Future Appointments            In 3 months Bacigalupo, Dionne Bucy, MD [facility], [place]

## 2021-07-27 ENCOUNTER — Telehealth: Payer: Medicare Other

## 2021-08-07 ENCOUNTER — Other Ambulatory Visit: Payer: Self-pay | Admitting: Family Medicine

## 2021-08-07 DIAGNOSIS — I129 Hypertensive chronic kidney disease with stage 1 through stage 4 chronic kidney disease, or unspecified chronic kidney disease: Secondary | ICD-10-CM

## 2021-08-07 NOTE — Telephone Encounter (Signed)
Requested Prescriptions  Pending Prescriptions Disp Refills  . losartan (COZAAR) 100 MG tablet [Pharmacy Med Name: LOSARTAN POTASSIUM 100 MG TAB] 90 tablet 0    Sig: TAKE 1 TABLET BY MOUTH EVERY DAY     Cardiovascular:  Angiotensin Receptor Blockers Failed - 08/07/2021  2:25 AM      Failed - Cr in normal range and within 180 days    Creatinine, Ser  Date Value Ref Range Status  04/17/2021 1.69 (H) 0.57 - 1.00 mg/dL Final         Passed - K in normal range and within 180 days    Potassium  Date Value Ref Range Status  04/17/2021 4.9 3.5 - 5.2 mmol/L Final         Passed - Patient is not pregnant      Passed - Last BP in normal range    BP Readings from Last 1 Encounters:  04/17/21 135/74         Passed - Valid encounter within last 6 months    Recent Outpatient Visits          3 months ago Encounter for [employer] annual wellness exam   [facility], Dionne Bucy, MD   8 months ago Hypertension associated with diabetes [facility])   [facility], Dionne Bucy, MD   1 year ago Syncope, unspecified syncope type   [facility], Dionne Bucy, MD   1 year ago Benign hypertension with CKD (chronic kidney disease) stage III [facility])   [place] Bacigalupo, Dionne Bucy, MD   1 year ago Resistant hypertension   [place], Clearnce Sorrel, [place]      Future Appointments            In 2 months Bacigalupo, Dionne Bucy, MD [facility], [place]

## 2021-08-12 ENCOUNTER — Telehealth: Payer: Self-pay | Admitting: Family Medicine

## 2021-08-12 NOTE — Telephone Encounter (Signed)
Sarah RN from [place], she did a wellness exam today, a circulation screening. [facility] will send over a full report of the full visit within 1-2 weeks in the mail. Mild to moderate decreased circulation in both her right and left leg.  Pt states she has some pain and cramping in her lower legs and feet. She says she stopped taking her Zetia because she feels like that is making it worse.   Best contact: [phone]

## 2021-09-03 ENCOUNTER — Other Ambulatory Visit: Payer: Self-pay | Admitting: Family Medicine

## 2021-09-03 DIAGNOSIS — I129 Hypertensive chronic kidney disease with stage 1 through stage 4 chronic kidney disease, or unspecified chronic kidney disease: Secondary | ICD-10-CM

## 2021-09-03 DIAGNOSIS — E1122 Type 2 diabetes mellitus with diabetic chronic kidney disease: Secondary | ICD-10-CM

## 2021-09-21 DIAGNOSIS — H43813 Vitreous degeneration, bilateral: Secondary | ICD-10-CM | POA: Diagnosis not present

## 2021-09-21 DIAGNOSIS — E113391 Type 2 diabetes mellitus with moderate nonproliferative diabetic retinopathy without macular edema, right eye: Secondary | ICD-10-CM | POA: Diagnosis not present

## 2021-09-21 DIAGNOSIS — Z961 Presence of intraocular lens: Secondary | ICD-10-CM | POA: Diagnosis not present

## 2021-09-21 LAB — HM DIABETES EYE EXAM

## 2021-09-30 ENCOUNTER — Other Ambulatory Visit: Payer: Self-pay | Admitting: Family Medicine

## 2021-09-30 DIAGNOSIS — K219 Gastro-esophageal reflux disease without esophagitis: Secondary | ICD-10-CM

## 2021-10-20 ENCOUNTER — Ambulatory Visit: Payer: Medicare Other | Admitting: Family Medicine

## 2021-11-05 ENCOUNTER — Other Ambulatory Visit: Payer: Self-pay | Admitting: Family Medicine

## 2021-11-05 DIAGNOSIS — K219 Gastro-esophageal reflux disease without esophagitis: Secondary | ICD-10-CM

## 2021-11-05 DIAGNOSIS — N183 Chronic kidney disease, stage 3 unspecified: Secondary | ICD-10-CM

## 2021-11-17 ENCOUNTER — Encounter: Payer: Self-pay | Admitting: Family Medicine

## 2021-11-17 ENCOUNTER — Ambulatory Visit (INDEPENDENT_AMBULATORY_CARE_PROVIDER_SITE_OTHER): Payer: Medicare Other | Admitting: Family Medicine

## 2021-11-17 VITALS — BP 149/74 | HR 68 | Resp 16 | Wt 205.0 lb

## 2021-11-17 DIAGNOSIS — T466X5A Adverse effect of antihyperlipidemic and antiarteriosclerotic drugs, initial encounter: Secondary | ICD-10-CM | POA: Diagnosis not present

## 2021-11-17 DIAGNOSIS — N183 Chronic kidney disease, stage 3 unspecified: Secondary | ICD-10-CM | POA: Diagnosis not present

## 2021-11-17 DIAGNOSIS — E1122 Type 2 diabetes mellitus with diabetic chronic kidney disease: Secondary | ICD-10-CM | POA: Diagnosis not present

## 2021-11-17 DIAGNOSIS — M5441 Lumbago with sciatica, right side: Secondary | ICD-10-CM | POA: Diagnosis not present

## 2021-11-17 DIAGNOSIS — G8929 Other chronic pain: Secondary | ICD-10-CM | POA: Diagnosis not present

## 2021-11-17 DIAGNOSIS — I129 Hypertensive chronic kidney disease with stage 1 through stage 4 chronic kidney disease, or unspecified chronic kidney disease: Secondary | ICD-10-CM | POA: Diagnosis not present

## 2021-11-17 DIAGNOSIS — M791 Myalgia, unspecified site: Secondary | ICD-10-CM

## 2021-11-17 DIAGNOSIS — N1832 Chronic kidney disease, stage 3b: Secondary | ICD-10-CM

## 2021-11-17 DIAGNOSIS — Z6839 Body mass index (BMI) 39.0-39.9, adult: Secondary | ICD-10-CM

## 2021-11-17 LAB — POCT GLYCOSYLATED HEMOGLOBIN (HGB A1C)
Est. average glucose Bld gHb Est-mCnc: 114
Hemoglobin A1C: 6.3 % — AB (ref 4.0–5.6)

## 2021-11-17 NOTE — Progress Notes (Signed)
   SUBJECTIVE:   CHIEF COMPLAINT / HPI:   Hypertension: - Medications: amlodipine, losartan, metoprolol, torsemide, doxazosin - Compliance: good - Checking BP at home: no - Denies any SOB, CP, vision changes, LE edema, medication SEs, or symptoms of hypotension  Diabetes, Type 2 - Last A1c 6.5 03/2021 - Medications: metformin - Compliance: good - Checking BG at home: no - Exercise: difficult 2/2 back pain - Eye exam: UTD - Foot exam: UTD - Microalbumin: UTD - Statin: no, intolerant - Denies symptoms of hypoglycemia, polyuria, polydipsia  GERD - Meds: omeprazole daily - Symptoms:  heartburn.  - denies dysphagia has not lost weight denies melena, hematochezia, hematemesis, and coffee ground emesis.   Low back pain - chronic. With severe lumbar scoliosis seen on previous imaging. Tylenol prn for pain. Never tried PT.   OBJECTIVE:   BP (!) 149/74 (BP Location: Right Arm, Patient Position: Sitting, Cuff Size: Large)   Pulse 68   Resp 16   Wt 205 lb (93 kg)   SpO2 97%   BMI 40.04 kg/m   Gen: well appearing, in NAD Card: Reg rate Lungs: Comfortable WOB on RA Ext: WWP, no edema   ASSESSMENT/PLAN:   Low back pain 2/2 severe lumbar scoliosis. Continue tylenol prn for pain relief. Refer to PT.  Benign hypertension with CKD (chronic kidney disease) stage III Slightly elevated today but with previous normal measurements on same regimen.no changes today. Encouraged home monitoring. Has Cardiology f/u next month. Has labs with nephro early next week.   T2DM (type 2 diabetes mellitus) (HCC) A1c at goal, no changes.      Myles Gip, DO

## 2021-11-17 NOTE — Assessment & Plan Note (Signed)
2/2 severe lumbar scoliosis. Continue tylenol prn for pain relief. Refer to PT.

## 2021-11-17 NOTE — Assessment & Plan Note (Signed)
Slightly elevated today but with previous normal measurements on same regimen.no changes today. Encouraged home monitoring. Has Cardiology f/u next month. Has labs with nephro early next week.

## 2021-11-17 NOTE — Assessment & Plan Note (Signed)
A1c at goal, no changes.

## 2021-11-21 ENCOUNTER — Other Ambulatory Visit: Payer: Self-pay | Admitting: Family Medicine

## 2021-11-21 DIAGNOSIS — E1122 Type 2 diabetes mellitus with diabetic chronic kidney disease: Secondary | ICD-10-CM

## 2021-11-23 DIAGNOSIS — E1122 Type 2 diabetes mellitus with diabetic chronic kidney disease: Secondary | ICD-10-CM | POA: Diagnosis not present

## 2021-11-23 DIAGNOSIS — I10 Essential (primary) hypertension: Secondary | ICD-10-CM | POA: Diagnosis not present

## 2021-11-23 DIAGNOSIS — R600 Localized edema: Secondary | ICD-10-CM | POA: Diagnosis not present

## 2021-11-23 DIAGNOSIS — N1832 Chronic kidney disease, stage 3b: Secondary | ICD-10-CM | POA: Diagnosis not present

## 2021-11-23 NOTE — Telephone Encounter (Signed)
Requested Prescriptions  Pending Prescriptions Disp Refills  . metFORMIN (GLUCOPHAGE) 500 MG tablet [Pharmacy Med Name: METFORMIN HCL 500 MG TABLET] 90 tablet 0    Sig: TAKE 1 TABLET BY MOUTH EVERY DAY WITH BREAKFAST     Endocrinology:  Diabetes - Biguanides Failed - 11/21/2021 11:08 AM      Failed - Cr in normal range and within 360 days    Creatinine, Ser  Date Value Ref Range Status  04/17/2021 1.69 (H) 0.57 - 1.00 mg/dL Final         Failed - eGFR in normal range and within 360 days    GFR calc Af Amer  Date Value Ref Range Status  07/16/2020 43  Final   GFR calc non Af Amer  Date Value Ref Range Status  09/21/2019 33 (L) >59 mL/min/1.73 Final   eGFR  Date Value Ref Range Status  04/17/2021 31 (L) >59 mL/min/1.73 Final         Failed - B12 Level in normal range and within 720 days    Vitamin B-12  Date Value Ref Range Status  04/17/2021 200 (L) 232 - 1,245 pg/mL Final         Failed - CBC within normal limits and completed in the last 12 months    WBC  Date Value Ref Range Status  04/17/2021 6.4 3.4 - 10.8 x10E3/uL Final  04/01/2018 8.9 4.0 - 10.5 K/uL Final   RBC  Date Value Ref Range Status  04/17/2021 3.67 (L) 3.77 - 5.28 x10E6/uL Final  07/16/2020 3.63 (A) 3.87 - 5.11 Final   Hemoglobin  Date Value Ref Range Status  04/17/2021 10.5 (L) 11.1 - 15.9 g/dL Final   Hematocrit  Date Value Ref Range Status  04/17/2021 32.5 (L) 34.0 - 46.6 % Final   MCHC  Date Value Ref Range Status  04/17/2021 32.3 31.5 - 35.7 g/dL Final  04/01/2018 32.8 30.0 - 36.0 g/dL Final   [facility]  Date Value Ref Range Status  04/17/2021 28.6 26.6 - 33.0 pg Final  04/01/2018 28.6 26.0 - 34.0 pg Final   MCV  Date Value Ref Range Status  04/17/2021 89 79 - 97 fL Final   No results found for: "PLTCOUNTKUC", "LABPLAT", "POCPLA" RDW  Date Value Ref Range Status  04/17/2021 12.5 11.7 - 15.4 % Final         Passed - HBA1C is between 0 and 7.9 and within 180 days    Hemoglobin A1C   Date Value Ref Range Status  11/17/2021 6.3 (A) 4.0 - 5.6 % Final   Hgb A1c MFr Bld  Date Value Ref Range Status  04/17/2021 6.5 (H) 4.8 - 5.6 % Final    Comment:             Prediabetes: 5.7 - 6.4          Diabetes: >6.4          Glycemic control for adults with diabetes: <7.0          Passed - Valid encounter within last 6 months    Recent Outpatient Visits          6 days ago Type 2 diabetes mellitus with stage 3b chronic kidney disease, without long-term current use of insulin [facility])   [place], Jake Church, DO   7 months ago Encounter for [employer] annual wellness exam   [facility], Dionne Bucy, MD   1 year ago Hypertension associated with diabetes ([place])  [facility] Bacigalupo, Dionne Bucy, MD   1 year ago Syncope, unspecified syncope type   [facility], Dionne Bucy, MD   1 year ago Benign hypertension with CKD (chronic kidney disease) stage III [facility])   [place] Bacigalupo, Dionne Bucy, MD      Future Appointments            In 1 week Gollan, Kathlene November, MD [place]. [place]

## 2021-11-25 ENCOUNTER — Other Ambulatory Visit: Payer: Self-pay | Admitting: Family Medicine

## 2021-11-25 DIAGNOSIS — I129 Hypertensive chronic kidney disease with stage 1 through stage 4 chronic kidney disease, or unspecified chronic kidney disease: Secondary | ICD-10-CM

## 2021-11-25 DIAGNOSIS — N183 Chronic kidney disease, stage 3 unspecified: Secondary | ICD-10-CM

## 2021-11-30 NOTE — Progress Notes (Unsigned)
Cardiology Office Note  Date:  12/01/2021   ID:  Miranda Olsen, DOB [birth year], MRN [id number]  PCP:  Virginia Crews, MD   Chief Complaint  Patient presents with   12 month follow up     "Doing well." Medications reviewed by the patient verbally.     HPI:  Miranda Olsen is a 81 year old woman with past medical history of Hypertension Hyperlipidemia Chronic kidney disease , GFR greater than 50 Diabetes Scoliosis Who presents for hypertension management, remote episode of syncope June 2022  Last seen by myself in clinic August 2022 In follow-up today reports she is feeling well Limited mobility secondary to chronic back pain Sleeps in recliner with her feet up Minimal leg edema Feels her blood pressure is well controlled Takes several of her medications in the evening to avoid morning hypotension  Reports that she did not tolerate Crestor or Zetia, this caused muscle ache and she stopped the medications  Labs reviewed: CR 1.69 A1C 6.3 Cholesterol 300  EKG personally reviewed by myself on todays visit Normal sinus rhythm rate 77 bpm PVCs  Other past medical history reviewed 1 episode of syncope 06/2020 Was taking care of her granddaughter, granddaughter was up all night she did not sleep very well Miranda Olsen was exhausted that morning, did not get anything to eat or drink, took her morning medications Reports that church was hot, was back with the children, sitting Felt very tired, started developing discomfort in her shoulders all over  Next thing she knows, she she was out for several seconds and does not remember what happens EMTs come out, blood pressure EKG normal did not want to go to the hospital. blood pressure being 126/93. In retrospect she does report taking medications on an empty stomach " Never got to drink my coffee"  Since then no further episodes, denies any orthostasis symptoms Blood pressure typically 130  Stressors, son passed away  Nov 17, 2019  CT scan abdomen pelvis March 2020  Small to moderate sized hiatal hernia, umbilical hernia, Tortuous aorta Minimal aortic atherosclerosis  Family history Mother with high blood pressure   PMH:   has a past medical history of Diabetes mellitus without complication ([place]), Hypercholesterolemia, and Hypertension.  PSH:    Past Surgical History:  Procedure Laterality Date   COLONOSCOPY WITH PROPOFOL N/A 01/27/2015   Procedure: COLONOSCOPY WITH PROPOFOL;  Surgeon: Josefine Class, MD;  Location: [facility] ENDOSCOPY;  Service: Endoscopy;  Laterality: N/A;   EYE SURGERY Left 01/21/2009    cataract surgery    EYE SURGERY Right 04/08/2009   cataract surgery   REPLACEMENT TOTAL KNEE Left 10/08/2009   Dr. Marry Guan    Current Outpatient Medications  Medication Sig Dispense Refill   acetaminophen (TYLENOL) 325 MG tablet Take 650 mg by mouth every 6 (six) hours as needed.     amLODipine (NORVASC) 5 MG tablet TAKE 1 TABLET BY MOUTH EVERY DAY 90 tablet 3   cholecalciferol (VITAMIN D) 1000 UNITS tablet Take 1,000 Units by mouth daily. Pt taking 2000 daily     Cinnamon 500 MG capsule Take 500 mg by mouth daily.     Cyanocobalamin (VITAMIN B 12 PO) Take 2,000 Units by mouth daily.     doxazosin (CARDURA) 2 MG tablet Take 1 tablet (2 mg total) by mouth daily. 90 tablet 2   ferrous sulfate 325 (65 FE) MG tablet TAKE 1 TABLET BY MOUTH EVERY DAY 90 tablet 0   Garlic 0175 MG CAPS Take by mouth  daily.     losartan (COZAAR) 100 MG tablet TAKE 1 TABLET BY MOUTH EVERY DAY 90 tablet 0   metFORMIN (GLUCOPHAGE) 500 MG tablet TAKE 1 TABLET BY MOUTH EVERY DAY WITH BREAKFAST 90 tablet 0   metoprolol succinate (TOPROL-XL) 50 MG 24 hr tablet TAKE 1 TABLET BY MOUTH EVERY DAY WITH OR IMMEDIATELY FOLLOWING A MEAL 90 tablet 0   Omega-3 1000 MG CAPS Take 2 capsules by mouth daily.     omeprazole (PRILOSEC) 40 MG capsule TAKE 1 CAPSULE BY MOUTH EVERY DAY 90 capsule 0   torsemide (DEMADEX) 10 MG tablet Take 1  tablet (10 mg total) by mouth every morning. 90 tablet 3   Blood Pressure KIT Use to check blood pressure as directed (Patient not taking: Reported on 12/01/2021) 1 kit 0   No current facility-administered medications for this visit.    Allergies:   Clonidine derivatives   Social History:  The patient  reports that she has never smoked. She has never used smokeless tobacco. She reports that she does not currently use alcohol. She reports that she does not use drugs.   Family History:   family history includes Breast cancer (age of onset: 72) in an other family member; Breast cancer (age of onset: 20) in her sister; Breast cancer (age of onset: 18) in her maternal aunt; Cancer in her sister; Congestive Heart Failure in her sister and sister; Leukemia in her father; Lung cancer in her brother; Parkinsonism in her mother.    Review of Systems: Review of Systems  Constitutional: Negative.   HENT: Negative.    Respiratory: Negative.    Cardiovascular: Negative.   Gastrointestinal: Negative.   Musculoskeletal:  Positive for back pain.  Neurological: Negative.   Psychiatric/Behavioral: Negative.    All other systems reviewed and are negative.   PHYSICAL EXAM: VS:  BP (!) 140/80 (BP Location: Left Arm, Patient Position: Sitting, Cuff Size: Normal)   Pulse 77   Ht _0  (1.549 m)   Wt 203 lb 6 oz (92.3 kg)   SpO2 97%   BMI 38.43 kg/m  , BMI Body mass index is 38.43 kg/m. Constitutional:  oriented to person, place, and time. No distress.  HENT:  Head: Grossly normal Eyes:  no discharge. No scleral icterus.  Neck: No JVD, no carotid bruits  Cardiovascular: Regular rate and rhythm, no murmurs appreciated Pulmonary/Chest: Clear to auscultation bilaterally, no wheezes or rails Abdominal: Soft.  no distension.  no tenderness.  Musculoskeletal: Normal range of motion Neurological:  normal muscle tone. Coordination normal. No atrophy Skin: Skin warm and dry Psychiatric: normal affect,  pleasant  Recent Labs: 04/17/2021: ALT 9; BUN 25; Creatinine, Ser 1.69; Hemoglobin 10.5; Platelets 233; Potassium 4.9; Sodium 136    Lipid Panel Lab Results  Component Value Date   CHOL 301 (H) 04/17/2021   HDL 74 04/17/2021   LDLCALC 215 (H) 04/17/2021   TRIG 76 04/17/2021      Wt Readings from Last 3 Encounters:  12/01/21 203 lb 6 oz (92.3 kg)  11/17/21 205 lb (93 kg)  04/17/21 202 lb 12.8 oz (92 kg)     ASSESSMENT AND PLAN:  Problem List Items Addressed This Visit       Cardiology Problems   Benign hypertension with CKD (chronic kidney disease) stage III (HCC)   Relevant Orders   EKG 12-Lead     Other   Localized edema   CKD stage 3 due to type 2 diabetes mellitus ([place])   Other  Visit Diagnoses     Diabetes mellitus without complication ([place])    -  Primary   Relevant Orders   EKG 12-Lead   Hypercholesteremia       Relevant Orders   EKG 12-Lead      Essential hypertension Blood pressure is well controlled on today's visit. No changes made to the medications.  Hyperlipidemia Reports having difficulty tolerating Crestor,   aortic atherosclerosis on CT scan off zetia, leg pain, stopped it Given cholesterol was 300, recommend she start Repatha 140 mg subcu every 2 weeks  Obesity We have encouraged continued exercise, careful diet management in an effort to lose weight.  Diabetes type 2 A1c steady 6.3 Weight stable   Total encounter time more than 30 minutes  Greater than 50% was spent in counseling and coordination of care with the patient    Signed, Esmond Plants, M.D., Ph.D. [place], [place]

## 2021-12-01 ENCOUNTER — Encounter: Payer: Self-pay | Admitting: Cardiovascular Disease

## 2021-12-01 ENCOUNTER — Ambulatory Visit: Payer: Medicare Other | Attending: Cardiovascular Disease | Admitting: Cardiovascular Disease

## 2021-12-01 VITALS — BP 140/80 | HR 77 | Ht 61.0 in | Wt 203.4 lb

## 2021-12-01 DIAGNOSIS — I129 Hypertensive chronic kidney disease with stage 1 through stage 4 chronic kidney disease, or unspecified chronic kidney disease: Secondary | ICD-10-CM | POA: Diagnosis not present

## 2021-12-01 DIAGNOSIS — I10 Essential (primary) hypertension: Secondary | ICD-10-CM

## 2021-12-01 DIAGNOSIS — E7800 Pure hypercholesterolemia, unspecified: Secondary | ICD-10-CM

## 2021-12-01 DIAGNOSIS — R600 Localized edema: Secondary | ICD-10-CM

## 2021-12-01 DIAGNOSIS — E119 Type 2 diabetes mellitus without complications: Secondary | ICD-10-CM

## 2021-12-01 DIAGNOSIS — E1122 Type 2 diabetes mellitus with diabetic chronic kidney disease: Secondary | ICD-10-CM

## 2021-12-01 DIAGNOSIS — N183 Chronic kidney disease, stage 3 unspecified: Secondary | ICD-10-CM | POA: Diagnosis not present

## 2021-12-01 MED ORDER — LOSARTAN POTASSIUM 100 MG PO TABS: 100.0000 mg | ORAL_TABLET | Freq: Every day | ORAL | 0 refills | Status: DC

## 2021-12-01 MED ORDER — TORSEMIDE 10 MG PO TABS: 10.0000 mg | ORAL_TABLET | ORAL | 3 refills | Status: DC

## 2021-12-01 MED ORDER — METOPROLOL SUCCINATE ER 50 MG PO TB24: ORAL_TABLET | ORAL | 0 refills | Status: DC

## 2021-12-01 MED ORDER — REPATHA SURECLICK 140 MG/ML ~~LOC~~ SOAJ: 140.0000 mg | SUBCUTANEOUS | 3 refills | Status: DC

## 2021-12-01 MED ORDER — AMLODIPINE BESYLATE 5 MG PO TABS: 5.0000 mg | ORAL_TABLET | Freq: Every day | ORAL | 3 refills | Status: DC

## 2021-12-01 NOTE — Patient Instructions (Addendum)
Medication Instructions:   - Repatha 140 sq every two weeks   If you need a refill on your cardiac medications before your next appointment, please call your pharmacy.   Lab work: Lipid panel 3 months  Testing/Procedures: No new testing needed  Follow-Up: At [facility], you and your health needs are our priority.  As part of our continuing mission to provide you with exceptional heart care, we have created designated Provider Care Teams.  These Care Teams include your primary Cardiologist (physician) and Advanced Practice Providers (APPs -  Physician Assistants and Nurse Practitioners) who all work together to provide you with the care you need, when you need it.  You will need a follow up appointment in 12 months  Providers on your designated Care Team:   Murray Hodgkins, NP Christell Faith, PA-C Cadence Kathlen Mody, [place]  COVID-19 Vaccine Information can be found at: [email or web address] For questions related to vaccine distribution or appointments, please email [email or web address] or call [phone].

## 2021-12-02 ENCOUNTER — Telehealth: Payer: Self-pay

## 2021-12-02 ENCOUNTER — Other Ambulatory Visit: Payer: Self-pay | Admitting: Family Medicine

## 2021-12-02 NOTE — Telephone Encounter (Signed)
Prior Authorization initiated by [email or web address] for Repatha.  KEY: [id number] Response: Wait for Determination Please wait for [employer] 2017 NCPDP to return a determination.

## 2021-12-02 NOTE — Telephone Encounter (Signed)
Requested Prescriptions  Pending Prescriptions Disp Refills   doxazosin (CARDURA) 2 MG tablet [Pharmacy Med Name: DOXAZOSIN MESYLATE 2 MG TAB] 90 tablet 2    Sig: TAKE 1 TABLET BY MOUTH EVERY DAY     Cardiovascular:  Alpha Blockers Failed - 12/02/2021  9:31 AM      Failed - Last BP in normal range    BP Readings from Last 1 Encounters:  12/01/21 (!) 140/80         Passed - Valid encounter within last 6 months    Recent Outpatient Visits           2 weeks ago Type 2 diabetes mellitus with stage 3b chronic kidney disease, without long-term current use of insulin [facility])   [facility] [place], Jake Church, DO   7 months ago Encounter for [employer] annual wellness exam   [facility] [place], Dionne Bucy, MD   1 year ago Hypertension associated with diabetes [facility])   [facility], Dionne Bucy, MD   1 year ago Syncope, unspecified syncope type   [facility], Dionne Bucy, MD   1 year ago Benign hypertension with CKD (chronic kidney disease) stage III [facility])   [facility], Dionne Bucy, MD

## 2021-12-03 DIAGNOSIS — I10 Essential (primary) hypertension: Secondary | ICD-10-CM | POA: Diagnosis not present

## 2021-12-03 DIAGNOSIS — E1122 Type 2 diabetes mellitus with diabetic chronic kidney disease: Secondary | ICD-10-CM | POA: Diagnosis not present

## 2021-12-03 DIAGNOSIS — N1832 Chronic kidney disease, stage 3b: Secondary | ICD-10-CM | POA: Diagnosis not present

## 2021-12-03 DIAGNOSIS — R600 Localized edema: Secondary | ICD-10-CM | POA: Diagnosis not present

## 2022-02-02 ENCOUNTER — Encounter: Payer: Self-pay | Admitting: Family Medicine

## 2022-02-02 ENCOUNTER — Ambulatory Visit (INDEPENDENT_AMBULATORY_CARE_PROVIDER_SITE_OTHER): Payer: 59 | Admitting: Family Medicine

## 2022-02-02 VITALS — BP 167/90 | HR 72 | Temp 98.5°F | Wt 204.9 lb

## 2022-02-02 DIAGNOSIS — K429 Umbilical hernia without obstruction or gangrene: Secondary | ICD-10-CM

## 2022-02-02 DIAGNOSIS — K625 Hemorrhage of anus and rectum: Secondary | ICD-10-CM

## 2022-02-02 DIAGNOSIS — I152 Hypertension secondary to endocrine disorders: Secondary | ICD-10-CM

## 2022-02-02 DIAGNOSIS — M6208 Separation of muscle (nontraumatic), other site: Secondary | ICD-10-CM | POA: Diagnosis not present

## 2022-02-02 DIAGNOSIS — I10 Essential (primary) hypertension: Secondary | ICD-10-CM | POA: Diagnosis not present

## 2022-02-02 DIAGNOSIS — E1159 Type 2 diabetes mellitus with other circulatory complications: Secondary | ICD-10-CM | POA: Diagnosis not present

## 2022-02-02 DIAGNOSIS — M542 Cervicalgia: Secondary | ICD-10-CM | POA: Diagnosis not present

## 2022-02-02 MED ORDER — AMLODIPINE BESYLATE 10 MG PO TABS: 10.0000 mg | ORAL_TABLET | Freq: Every day | ORAL | 1 refills | Status: DC

## 2022-02-02 NOTE — Assessment & Plan Note (Signed)
New problem Has h/o diverticular bleed Bleeding has already resolved x1 wk Bleeding was low volume and only with BMs +external hemorrhoids on exam, which is likely etiology Check CBC to ensure stability Discussed hemorrhoid precautions - avoid constipation, do not sit on toilet for long periods of time, adequate fiber

## 2022-02-02 NOTE — Assessment & Plan Note (Signed)
Chronic and uncontrolled on 5agents with poor control Also f/b Renal and Cards Increase amlodipine to 10 mg daily Recheck in 1 month

## 2022-02-02 NOTE — Assessment & Plan Note (Signed)
Feels like it is getting larger and more painful Will send back to Gen Surg - would like to see another MD for second opinion

## 2022-02-02 NOTE — Assessment & Plan Note (Signed)
Easily reducible Discussed that this is separate from her diastasis, which seems to be what is causing the most discomfort

## 2022-02-02 NOTE — Progress Notes (Signed)
Established Patient Office Visit  Subjective   Patient ID: Miranda Olsen, female    DOB: November 14, 1940  Age: 82 y.o. MRN: [id number]  Chief Complaint  Patient presents with   Rectal Bleeding    Since 01/24/22 - cramping one day - no visual blood as of 01/27/22   Neck Pain    Rectal Bleeding   Neck Pain     Bilateral neck pain. Hearts up into her head.  Ongoing for several months  Rectal bleeding x4 days. Has been resolved x1 wk. Could see a pink tint in the water of the toilet. Not as bad as when she was hospitalized previously. Has known hemorrhoids  Had colonoscopy in 2017 at [facility] - report not available  Lower GI bleed in 2020 from diverticulosis. No remaining abd pain. Resolved after BM Only had bleeding after BM  Stools back to normal  Review of Systems  Gastrointestinal:  Positive for hematochezia.  Musculoskeletal:  Positive for neck pain.      Objective:     BP (!) 167/90   Pulse 72   Temp 98.5 F (36.9 C)   Wt 204 lb 14.4 oz (92.9 kg)   SpO2 100%   BMI 38.72 kg/m    Physical Exam Vitals reviewed.  Constitutional:      General: She is not in acute distress.    Appearance: Normal appearance. She is well-developed. She is not diaphoretic.  HENT:     Head: Normocephalic and atraumatic.  Eyes:     General: No scleral icterus.    Conjunctiva/sclera: Conjunctivae normal.  Neck:     Thyroid: No thyromegaly.  Cardiovascular:     Rate and Rhythm: Normal rate and regular rhythm.     Pulses: Normal pulses.     Heart sounds: Normal heart sounds. No murmur heard. Pulmonary:     Effort: Pulmonary effort is normal. No respiratory distress.     Breath sounds: Normal breath sounds. No wheezing, rhonchi or rales.  Abdominal:     General: Bowel sounds are normal. There is no distension.     Palpations: Abdomen is soft.     Tenderness: There is no abdominal tenderness.     Hernia: A hernia (upper abd diastasis recti, umbilical hernia) is present.   Genitourinary:    Exam position: Knee-chest position.     Rectum: External hemorrhoid present.  Musculoskeletal:     Cervical back: Neck supple.     Right lower leg: No edema.     Left lower leg: No edema.  Lymphadenopathy:     Cervical: No cervical adenopathy.  Skin:    General: Skin is warm and dry.     Findings: No rash.  Neurological:     Mental Status: She is alert and oriented to person, place, and time. Mental status is at baseline.  Psychiatric:        Mood and Affect: Mood normal.        Behavior: Behavior normal.      No results found for any visits on 02/02/22.    The ASCVD Risk score (Arnett DK, et al., 2019) failed to calculate for the following reasons:   The 2019 ASCVD risk score is only valid for ages 36 to 40    Assessment & Plan:   Problem List Items Addressed This Visit       Cardiovascular and Mediastinum   Hypertension associated with diabetes ([place])    Chronic and uncontrolled on 5agents with poor control Also f/b Renal  and Cards Increase amlodipine to 10 mg daily Recheck in 1 month      Relevant Medications   amLODipine (NORVASC) 10 MG tablet     Digestive   Rectal bleeding    New problem Has h/o diverticular bleed Bleeding has already resolved x1 wk Bleeding was low volume and only with BMs +external hemorrhoids on exam, which is likely etiology Check CBC to ensure stability Discussed hemorrhoid precautions - avoid constipation, do not sit on toilet for long periods of time, adequate fiber      Relevant Orders   CBC     Musculoskeletal and Integument   Diastasis recti - Primary    Feels like it is getting larger and more painful Will send back to Gen Surg - would like to see another MD for second opinion      Relevant Orders   Ambulatory referral to General Surgery     Other   Umbilical hernia without obstruction and without gangrene    Easily reducible Discussed that this is separate from her diastasis, which seems to be  what is causing the most discomfort      Relevant Orders   Ambulatory referral to General Surgery   Neck pain    Re-refer to PT Patient states she never got a call      Relevant Orders   Ambulatory referral to Physical Therapy   Other Visit Diagnoses     Primary hypertension       Relevant Medications   amLODipine (NORVASC) 10 MG tablet       Return in about 4 weeks (around 03/02/2022) for chronic disease f/u.    Lavon Paganini, MD

## 2022-02-02 NOTE — Assessment & Plan Note (Signed)
Re-refer to PT Patient states she never got a call

## 2022-02-03 LAB — CBC
Hematocrit: 31.5 % — ABNORMAL LOW (ref 34.0–46.6)
Hemoglobin: 10.6 g/dL — ABNORMAL LOW (ref 11.1–15.9)
MCH: 29.6 pg (ref 26.6–33.0)
MCHC: 33.7 g/dL (ref 31.5–35.7)
MCV: 88 fL (ref 79–97)
Platelets: 266 10*3/uL (ref 150–450)
RBC: 3.58 x10E6/uL — ABNORMAL LOW (ref 3.77–5.28)
RDW: 12.8 % (ref 11.7–15.4)
WBC: 5.5 10*3/uL (ref 3.4–10.8)

## 2022-02-04 ENCOUNTER — Telehealth: Payer: Self-pay

## 2022-02-04 NOTE — Telephone Encounter (Signed)
Pt stated that she has not gotten a phone call to set up physical therapy. Pt would like update.

## 2022-02-04 NOTE — Telephone Encounter (Signed)
NA. Voicemail not set up.  

## 2022-02-08 NOTE — Telephone Encounter (Signed)
Pt stated she is returning missed call from today. Pt is also requesting a call back about PT.  Please advise.

## 2022-02-08 NOTE — Telephone Encounter (Signed)
Advised by referral coordinator that PT is behind on referral. She did give patient phone number for her to contact them.

## 2022-02-08 NOTE — Telephone Encounter (Signed)
Called pt to update on PT.  Unable to leave message-   PT has been approved by insurance and someone should be reaching out soon to schedule her.

## 2022-02-15 ENCOUNTER — Encounter: Payer: Self-pay | Admitting: Surgery

## 2022-02-15 ENCOUNTER — Ambulatory Visit (INDEPENDENT_AMBULATORY_CARE_PROVIDER_SITE_OTHER): Payer: 59 | Admitting: Surgery

## 2022-02-15 VITALS — BP 161/82 | HR 78 | Temp 98.3°F | Ht 61.0 in | Wt 204.8 lb

## 2022-02-15 DIAGNOSIS — I152 Hypertension secondary to endocrine disorders: Secondary | ICD-10-CM

## 2022-02-15 DIAGNOSIS — M6208 Separation of muscle (nontraumatic), other site: Secondary | ICD-10-CM | POA: Diagnosis not present

## 2022-02-15 DIAGNOSIS — K429 Umbilical hernia without obstruction or gangrene: Secondary | ICD-10-CM | POA: Diagnosis not present

## 2022-02-15 DIAGNOSIS — K439 Ventral hernia without obstruction or gangrene: Secondary | ICD-10-CM

## 2022-02-15 NOTE — Progress Notes (Signed)
02/15/2022  Reason for Visit:  Diastasis recti and ventral hernias  Requesting Provider:  Lavon Paganini, MD  History of Present Illness: Miranda Olsen is a 82 y.o. female presenting for evaluation of diastasis recti and ventral hernias.  The patient was seen in the past by Dr. Christian Mate for this and discussion with the patient concluded in no surgical management recommended.  The patient reports that more recently she feels that the area in her abdomen is growing larger and potentially bulging more towards her left side.  She denies any pain symptoms, but her main issue is the increase in size.  She wanted to get another opinion about possible surgical management.  She denies any chest pain, shortness of breath, nausea, vomiting, constipation.  She has obesity with BMI 38, hypertension, diabetes, hypercholesterolemia.  Past Medical History: Past Medical History:  Diagnosis Date   Diabetes mellitus without complication ([place])    Hypercholesterolemia    Hypertension      Past Surgical History: Past Surgical History:  Procedure Laterality Date   COLONOSCOPY WITH PROPOFOL N/A 01/27/2015   Procedure: COLONOSCOPY WITH PROPOFOL;  Surgeon: Josefine Class, MD;  Location[facility] ENDOSCOPY;  Service: Endoscopy;  Laterality: N/A;   EYE SURGERY Left 01/21/2009    cataract surgery    EYE SURGERY Right 04/08/2009   cataract surgery   REPLACEMENT TOTAL KNEE Left 10/08/2009   Dr. Marry Guan    Home Medications: Prior to Admission medications   Medication Sig Start Date End Date Taking? Authorizing Provider  acetaminophen (TYLENOL) 325 MG tablet Take 650 mg by mouth every 6 (six) hours as needed.   Yes [provider]  amLODipine (NORVASC) 10 MG tablet Take 1 tablet (10 mg total) by mouth daily. 02/02/22  Yes Virginia Crews, MD  Blood Pressure KIT Use to check blood pressure as directed 10/31/20  Yes Bacigalupo, Dionne Bucy, MD  cholecalciferol (VITAMIN D) 1000 UNITS tablet Take 1,000  Units by mouth daily. Pt taking 2000 daily 03/25/12  Yes [provider]  Cinnamon 500 MG capsule Take 500 mg by mouth daily.   Yes [provider]  Cyanocobalamin (VITAMIN B 12 PO) Take 2,000 Units by mouth daily.   Yes [provider]  doxazosin (CARDURA) 2 MG tablet TAKE 1 TABLET BY MOUTH EVERY DAY 12/02/21  Yes Bacigalupo, Dionne Bucy, MD  Evolocumab (REPATHA SURECLICK) 782 MG/ML SOAJ Inject 140 mg into the skin every 14 (fourteen) days. 12/01/21  Yes Minna Merritts, MD  ferrous sulfate 325 (65 FE) MG tablet TAKE 1 TABLET BY MOUTH EVERY DAY 04/29/19  Yes Mar Daring, PA-C  Garlic 9562 MG CAPS Take by mouth daily.   Yes [provider]  losartan (COZAAR) 100 MG tablet Take 1 tablet (100 mg total) by mouth daily. 12/01/21  Yes Gollan, Kathlene November, MD  metFORMIN (GLUCOPHAGE) 500 MG tablet TAKE 1 TABLET BY MOUTH EVERY DAY WITH BREAKFAST 11/23/21  Yes Bacigalupo, Dionne Bucy, MD  metoprolol succinate (TOPROL-XL) 50 MG 24 hr tablet TAKE 1 TABLET BY MOUTH EVERY DAY WITH OR IMMEDIATELY FOLLOWING A MEAL 12/01/21  Yes Gollan, Kathlene November, MD  Omega-3 1000 MG CAPS Take 2 capsules by mouth daily.   Yes [provider]  omeprazole (PRILOSEC) 40 MG capsule TAKE 1 CAPSULE BY MOUTH EVERY DAY 11/05/21  Yes Bacigalupo, Dionne Bucy, MD  torsemide (DEMADEX) 10 MG tablet Take 1 tablet (10 mg total) by mouth every morning. 12/01/21  Yes Minna Merritts, MD    Allergies:  Allergies  Allergen Reactions   Clonidine Derivatives Other (See Comments)    Social History:  reports that she has never smoked. She has never used smokeless tobacco. She reports that she does not currently use alcohol. She reports that she does not use drugs.   Family History: Family History  Problem Relation Age of Onset   Parkinsonism Mother    Leukemia Father    Cancer Sister        breast   Breast cancer Sister 7   Congestive Heart Failure Sister    Congestive Heart Failure Sister    Lung  cancer Brother    Breast cancer Maternal Aunt 60   Breast cancer Other 40    Review of Systems: Review of Systems  Constitutional:  Negative for chills and fever.  HENT:  Negative for hearing loss.   Respiratory:  Negative for shortness of breath.   Cardiovascular:  Negative for chest pain.  Gastrointestinal:  Negative for abdominal pain, nausea and vomiting.  Genitourinary:  Negative for dysuria.  Musculoskeletal:  Negative for myalgias.  Skin:  Negative for rash.  Neurological:  Negative for dizziness.  Psychiatric/Behavioral:  Negative for depression.     Physical Exam BP (!) 161/82   Pulse 78   Temp 98.3 F (36.8 C) (Oral)   Ht 5\' 1"  (1.549 m)   Wt 204 lb 12.8 oz (92.9 kg)   SpO2 97%   BMI 38.70 kg/m  CONSTITUTIONAL: No acute distress HEENT:  Normocephalic, atraumatic, extraocular motion intact. NECK: Trachea is midline, and there is no jugular venous distension.  RESPIRATORY:  Lungs are clear, and breath sounds are equal bilaterally. Normal respiratory effort without pathologic use of accessory muscles. CARDIOVASCULAR: Heart is regular without murmurs, gallops, or rubs. GI: The abdomen is soft, non-distended, non-tender to palpation.  The patient has diastasis recti of the upper abdomen, and it appears the separation is now closer to 8 cm.  The patient has two hernia defects associated with the diastasis, one in the epigastric region and one at the umbilicus.  Both are somewhat reducible, but hard to tell given body habitus. No palpable inguinal hernias. MUSCULOSKELETAL:  Normal muscle strength and tone in all four extremities.  No peripheral edema or cyanosis. SKIN: Skin turgor is normal. There are no pathologic skin lesions.  NEUROLOGIC:  Motor and sensation is grossly normal.  Cranial nerves are grossly intact. PSYCH:  Alert and oriented to person, place and time. Affect is normal.  Laboratory Analysis: Labs from 04/17/21: Na 136, K 4.9, Cl 100, CO2 24, BUN 25, Cr  1.69.  Total bili 0.4, AST 10, ALT 9, Alk Phos 57, Albumin 4.    Labs from 11/17/21:  HgA1c 6.3  Labs from 02/02/22: WBC 5.5, Hgb 10.6, Hct 31.5, Plt 266.  Imaging: CT abdomen/pelvis on 04/01/22: IMPRESSION: 1. No acute abnormality. 2. Colonic diverticulosis. 3. Small to moderate-sized hiatal hernia. 4. Small to moderate-sized umbilical hernia containing fat and small to moderate-sized bilateral inguinal hernias containing fat. 5. 2.7 cm exophytic uterine fibroid on the right.  Assessment and Plan: This is a 82 y.o. female with diastasis recti, umbilical and supraumbilical hernias.  --The patient's CT scan from 2020 actually shows that she has an umbilical and a supraumbilical hernia, both containing fat, associated with diastasis recti, with muscle separation of about 5 cm.  On exam today, I think the separation is wider, and perhaps the supraumbilical hernia sac is getting bigger.  This may be the reason why she feels everything  is larger or bulging more.  Discussed the potential role for surgery in repairing the hernias and plicating the diastasis, but first it would be best to obtain a new CT scan of her abdomen/pelvis to better evaluate her hernias and diastasis.  Discussed with her that if the diastasis if much wider, pulling the muscles together would be more complex to do it minimally invasive and a referral to a hernia center may be more prudent. --Will order CT scan and she will follow up with me afterwards to discuss the results.  I spent 40 minutes dedicated to the care of this patient on the date of this encounter to include pre-visit review of records, face-to-face time with the patient discussing diagnosis and management, and any post-visit coordination of care.   Melvyn Neth, [facility]

## 2022-02-15 NOTE — Patient Instructions (Addendum)
Patient has been scheduled for a CT abdomen/pelvis with contrast on Thursday February 1st at [place] will arrive at the [facility] entrance at 8:30 am  Prep: Nothing to eat for 4 hours prior. You will need to drink 2 cups of water prior to leaving for your scan.   We would like you to have lab work done before your scan. This can be done anytime at [facility], [facility] entrance.   We will have you follow up here after we get your results.

## 2022-02-18 ENCOUNTER — Other Ambulatory Visit
Admission: RE | Admit: 2022-02-18 | Discharge: 2022-02-18 | Disposition: A | Discharge status: Home / Self Care | Payer: 59 | Attending: Surgery | Admitting: Surgery

## 2022-02-18 DIAGNOSIS — K429 Umbilical hernia without obstruction or gangrene: Secondary | ICD-10-CM | POA: Diagnosis not present

## 2022-02-18 DIAGNOSIS — E1159 Type 2 diabetes mellitus with other circulatory complications: Secondary | ICD-10-CM | POA: Insufficient documentation

## 2022-02-18 DIAGNOSIS — I152 Hypertension secondary to endocrine disorders: Secondary | ICD-10-CM | POA: Insufficient documentation

## 2022-02-18 LAB — CREATININE, SERUM
Creatinine, Ser: 1.81 mg/dL — ABNORMAL HIGH (ref 0.44–1.00)
GFR, Estimated: 28 mL/min — ABNORMAL LOW (ref >60)

## 2022-02-18 LAB — BUN: BUN: 35 mg/dL — ABNORMAL HIGH (ref 8–23)

## 2022-02-25 ENCOUNTER — Ambulatory Visit
Admission: RE | Admit: 2022-02-25 | Discharge: 2022-02-25 | Disposition: A | Discharge status: Home / Self Care | Payer: 59 | Source: Ambulatory Visit | Attending: Surgery | Admitting: Surgery

## 2022-02-25 DIAGNOSIS — I771 Stricture of artery: Secondary | ICD-10-CM | POA: Diagnosis not present

## 2022-02-25 DIAGNOSIS — K429 Umbilical hernia without obstruction or gangrene: Secondary | ICD-10-CM

## 2022-02-25 DIAGNOSIS — K449 Diaphragmatic hernia without obstruction or gangrene: Secondary | ICD-10-CM | POA: Diagnosis not present

## 2022-02-25 DIAGNOSIS — K439 Ventral hernia without obstruction or gangrene: Secondary | ICD-10-CM | POA: Diagnosis not present

## 2022-02-26 ENCOUNTER — Other Ambulatory Visit: Payer: Self-pay | Admitting: Family Medicine

## 2022-02-26 DIAGNOSIS — E1122 Type 2 diabetes mellitus with diabetic chronic kidney disease: Secondary | ICD-10-CM

## 2022-03-08 ENCOUNTER — Encounter: Payer: Self-pay | Admitting: Surgery

## 2022-03-08 ENCOUNTER — Ambulatory Visit (INDEPENDENT_AMBULATORY_CARE_PROVIDER_SITE_OTHER): Payer: 59 | Admitting: Surgery

## 2022-03-08 VITALS — BP 154/78 | HR 109 | Temp 98.5°F | Ht 61.0 in | Wt 203.4 lb

## 2022-03-08 DIAGNOSIS — K439 Ventral hernia without obstruction or gangrene: Secondary | ICD-10-CM

## 2022-03-08 DIAGNOSIS — M6208 Separation of muscle (nontraumatic), other site: Secondary | ICD-10-CM

## 2022-03-08 DIAGNOSIS — K429 Umbilical hernia without obstruction or gangrene: Secondary | ICD-10-CM | POA: Diagnosis not present

## 2022-03-08 NOTE — Progress Notes (Signed)
03/08/2022  History of Present Illness: Miranda Olsen is a 82 y.o. female presenting for follow-up of umbilical and epigastric hernias associated with diastases recti.  The patient was last seen on 02/15/2022.  The patient then reported she felt that her hernias were getting bigger and wanted to get them addressed for then got worse.  We repeated a CT scan of her abdomen and pelvis on 02/25/2022 which overall shows stable epigastric and umbilical hernias.  On my measurements, the epigastric hernia is about 3.9 cm wide and the umbilical hernia is about 2.5 cm wide.  Her diastases is about 6 to 7 cm wide which includes the hernia defects.  Overall the patient denies any new symptoms but again is interested in repairing the hernias to prevent any progression or worsening.  Past Medical History: Past Medical History:  Diagnosis Date   Diabetes mellitus without complication ([place])    Hypercholesterolemia    Hypertension      Past Surgical History: Past Surgical History:  Procedure Laterality Date   COLONOSCOPY WITH PROPOFOL N/A 01/27/2015   Procedure: COLONOSCOPY WITH PROPOFOL;  Surgeon: Josefine Class, MD;  Location: [facility] ENDOSCOPY;  Service: Endoscopy;  Laterality: N/A;   EYE SURGERY Left 01/21/2009    cataract surgery    EYE SURGERY Right 04/08/2009   cataract surgery   REPLACEMENT TOTAL KNEE Left 10/08/2009   Dr. Marry Guan    Home Medications: Prior to Admission medications   Medication Sig Start Date End Date Taking? Authorizing Provider  acetaminophen (TYLENOL) 325 MG tablet Take 650 mg by mouth every 6 (six) hours as needed.   Yes [provider]  amLODipine (NORVASC) 10 MG tablet Take 1 tablet (10 mg total) by mouth daily. 02/02/22  Yes Virginia Crews, MD  Blood Pressure KIT Use to check blood pressure as directed 10/31/20  Yes Bacigalupo, Dionne Bucy, MD  cholecalciferol (VITAMIN D) 1000 UNITS tablet Take 1,000 Units by mouth daily. Pt taking 2000 daily 03/25/12  Yes  [provider]  Cinnamon 500 MG capsule Take 500 mg by mouth daily.   Yes [provider]  Cyanocobalamin (VITAMIN B 12 PO) Take 2,000 Units by mouth daily.   Yes [provider]  doxazosin (CARDURA) 2 MG tablet TAKE 1 TABLET BY MOUTH EVERY DAY 12/02/21  Yes Bacigalupo, Dionne Bucy, MD  Evolocumab (REPATHA SURECLICK) [id number] MG/ML SOAJ Inject 140 mg into the skin every 14 (fourteen) days. 12/01/21  Yes Minna Merritts, MD  ferrous sulfate 325 (65 FE) MG tablet TAKE 1 TABLET BY MOUTH EVERY DAY 04/29/19  Yes Mar Daring, PA-C  Garlic 123XX123 MG CAPS Take by mouth daily.   Yes [provider]  losartan (COZAAR) 100 MG tablet Take 1 tablet (100 mg total) by mouth daily. 12/01/21  Yes Gollan, Kathlene November, MD  metFORMIN (GLUCOPHAGE) 500 MG tablet TAKE 1 TABLET BY MOUTH EVERY DAY WITH BREAKFAST 02/26/22  Yes Bacigalupo, Dionne Bucy, MD  metoprolol succinate (TOPROL-XL) 50 MG 24 hr tablet TAKE 1 TABLET BY MOUTH EVERY DAY WITH OR IMMEDIATELY FOLLOWING A MEAL 12/01/21  Yes Gollan, Kathlene November, MD  Omega-3 1000 MG CAPS Take 2 capsules by mouth daily.   Yes [provider]  omeprazole (PRILOSEC) 40 MG capsule TAKE 1 CAPSULE BY MOUTH EVERY DAY 11/05/21  Yes Bacigalupo, Dionne Bucy, MD  torsemide (DEMADEX) 10 MG tablet Take 1 tablet (10 mg total) by mouth every morning. 12/01/21  Yes Minna Merritts, MD    Allergies: Allergies  Allergen Reactions   Clonidine Derivatives Other (See Comments)    Review of Systems: Review of Systems  Constitutional:  Negative for chills and fever.  HENT:  Negative for hearing loss.   Respiratory:  Negative for shortness of breath.   Cardiovascular:  Negative for chest pain.  Gastrointestinal:  Positive for abdominal pain. Negative for nausea and vomiting.  Genitourinary:  Negative for dysuria.  Musculoskeletal:  Negative for myalgias.  Skin:  Negative for rash.  Neurological:  Negative for dizziness.  Psychiatric/Behavioral:  Negative for  depression.     Physical Exam BP (!) 154/78   Pulse (!) 109   Temp 98.5 F (36.9 C) (Oral)   Ht 5' 1"$  (1.549 m)   Wt 203 lb 6.4 oz (92.3 kg)   SpO2 94%   BMI 38.43 kg/m  CONSTITUTIONAL: Acute distress, well-nourished HEENT:  Normocephalic, atraumatic, extraocular motion intact. NECK: Trachea is midline, no jugular venous distention. RESPIRATORY:  Lungs are clear, and breath sounds are equal bilaterally. Normal respiratory effort without pathologic use of accessory muscles. CARDIOVASCULAR: Heart is regular without murmurs, gallops, or rubs. GI: The abdomen is soft, nondistended, currently nontender to palpation.  Patient has stable epigastric and umbilical hernias associated with diastases recti.  Both hernias are reducible.  MUSCULOSKELETAL: Normal gait, no peripheral edema. NEUROLOGIC:  Motor and sensation is grossly normal.  Cranial nerves are grossly intact. PSYCH:  Alert and oriented to person, place and time. Affect is normal.  Labs/Imaging: CT abdomen/pelvis on 02/25/2022: IMPRESSION: 1. Anterior abdominal wall periumbilical defect containing omental fat without incarceration or obstruction are stable findings. 2. Small hiatal hernia. 3. Mid abdominal aortic aneurysm 3.4 cm. 4. Small pericardial effusion. 5. Numerous bilateral renal parenchymal lesions consistent with cysts that were seen on the prior study from 2020.  Assessment and Plan: This is a 82 y.o. female with an umbilical epigastric hernia associated with diastases recti.  - Discussed with patient the findings on her CT scan and overall her 2 hernias are stable and the diastases is also stable.  Discussed with her that given the measurements, I think is reasonable to proceed with a robotic assisted ventral hernia repair with plication of diastases recti.  Discussed with the patient that the plication would be simultaneous with the repair of the hernias and that we would use a mesh underneath to reinforce the repair.   Reviewed with her the surgery at length including the planned incisions, the risks of bleeding, infection, injury to surrounding structures, that this would be an outpatient procedure, the use of abdominal binder postoperatively, pain control, activity restrictions, and she is willing to proceed. - We will schedule her surgery for 03/30/2022.  We will also send for medical clearance in the meantime. - All of her questions have been answered.  I spent 40 minutes dedicated to the care of this patient on the date of this encounter to include pre-visit review of records, face-to-face time with the patient discussing diagnosis and management, and any post-visit coordination of care.   Melvyn Neth, [facility]

## 2022-03-08 NOTE — H&P (View-Only) (Signed)
03/08/2022  History of Present Illness: Miranda Olsen is a 82 y.o. female presenting for follow-up of umbilical and epigastric hernias associated with diastases recti.  The patient was last seen on 02/15/2022.  The patient then reported she felt that her hernias were getting bigger and wanted to get them addressed for then got worse.  We repeated a CT scan of her abdomen and pelvis on 02/25/2022 which overall shows stable epigastric and umbilical hernias.  On my measurements, the epigastric hernia is about 3.9 cm wide and the umbilical hernia is about 2.5 cm wide.  Her diastases is about 6 to 7 cm wide which includes the hernia defects.  Overall the patient denies any new symptoms but again is interested in repairing the hernias to prevent any progression or worsening.  Past Medical History: Past Medical History:  Diagnosis Date   Diabetes mellitus without complication ([place])    Hypercholesterolemia    Hypertension      Past Surgical History: Past Surgical History:  Procedure Laterality Date   COLONOSCOPY WITH PROPOFOL N/A 01/27/2015   Procedure: COLONOSCOPY WITH PROPOFOL;  Surgeon: Josefine Class, MD;  Location: [facility] ENDOSCOPY;  Service: Endoscopy;  Laterality: N/A;   EYE SURGERY Left 01/21/2009    cataract surgery    EYE SURGERY Right 04/08/2009   cataract surgery   REPLACEMENT TOTAL KNEE Left 10/08/2009   Dr. Marry Guan    Home Medications: Prior to Admission medications   Medication Sig Start Date End Date Taking? Authorizing Provider  acetaminophen (TYLENOL) 325 MG tablet Take 650 mg by mouth every 6 (six) hours as needed.   Yes [provider]  amLODipine (NORVASC) 10 MG tablet Take 1 tablet (10 mg total) by mouth daily. 02/02/22  Yes Virginia Crews, MD  Blood Pressure KIT Use to check blood pressure as directed 10/31/20  Yes Bacigalupo, Dionne Bucy, MD  cholecalciferol (VITAMIN D) 1000 UNITS tablet Take 1,000 Units by mouth daily. Pt taking 2000 daily 03/25/12  Yes  [provider]  Cinnamon 500 MG capsule Take 500 mg by mouth daily.   Yes [provider]  Cyanocobalamin (VITAMIN B 12 PO) Take 2,000 Units by mouth daily.   Yes [provider]  doxazosin (CARDURA) 2 MG tablet TAKE 1 TABLET BY MOUTH EVERY DAY 12/02/21  Yes Bacigalupo, Dionne Bucy, MD  Evolocumab (REPATHA SURECLICK) [id number] MG/ML SOAJ Inject 140 mg into the skin every 14 (fourteen) days. 12/01/21  Yes Minna Merritts, MD  ferrous sulfate 325 (65 FE) MG tablet TAKE 1 TABLET BY MOUTH EVERY DAY 04/29/19  Yes Mar Daring, PA-C  Garlic [id number] MG CAPS Take by mouth daily.   Yes [provider]  losartan (COZAAR) 100 MG tablet Take 1 tablet (100 mg total) by mouth daily. 12/01/21  Yes Gollan, Kathlene November, MD  metFORMIN (GLUCOPHAGE) 500 MG tablet TAKE 1 TABLET BY MOUTH EVERY DAY WITH BREAKFAST 02/26/22  Yes Bacigalupo, Dionne Bucy, MD  metoprolol succinate (TOPROL-XL) 50 MG 24 hr tablet TAKE 1 TABLET BY MOUTH EVERY DAY WITH OR IMMEDIATELY FOLLOWING A MEAL 12/01/21  Yes Gollan, Kathlene November, MD  Omega-3 1000 MG CAPS Take 2 capsules by mouth daily.   Yes [provider]  omeprazole (PRILOSEC) 40 MG capsule TAKE 1 CAPSULE BY MOUTH EVERY DAY 11/05/21  Yes Bacigalupo, Dionne Bucy, MD  torsemide (DEMADEX) 10 MG tablet Take 1 tablet (10 mg total) by mouth every morning. 12/01/21  Yes Minna Merritts, MD    Allergies: Allergies  Allergen Reactions   Clonidine Derivatives Other (See Comments)    Review of Systems: Review of Systems  Constitutional:  Negative for chills and fever.  HENT:  Negative for hearing loss.   Respiratory:  Negative for shortness of breath.   Cardiovascular:  Negative for chest pain.  Gastrointestinal:  Positive for abdominal pain. Negative for nausea and vomiting.  Genitourinary:  Negative for dysuria.  Musculoskeletal:  Negative for myalgias.  Skin:  Negative for rash.  Neurological:  Negative for dizziness.  Psychiatric/Behavioral:  Negative for  depression.     Physical Exam BP (!) 154/78   Pulse (!) 109   Temp 98.5 F (36.9 C) (Oral)   Ht '5\' 1"'$  (1.549 m)   Wt 203 lb 6.4 oz (92.3 kg)   SpO2 94%   BMI 38.43 kg/m  CONSTITUTIONAL: Acute distress, well-nourished HEENT:  Normocephalic, atraumatic, extraocular motion intact. NECK: Trachea is midline, no jugular venous distention. RESPIRATORY:  Lungs are clear, and breath sounds are equal bilaterally. Normal respiratory effort without pathologic use of accessory muscles. CARDIOVASCULAR: Heart is regular without murmurs, gallops, or rubs. GI: The abdomen is soft, nondistended, currently nontender to palpation.  Patient has stable epigastric and umbilical hernias associated with diastases recti.  Both hernias are reducible.  MUSCULOSKELETAL: Normal gait, no peripheral edema. NEUROLOGIC:  Motor and sensation is grossly normal.  Cranial nerves are grossly intact. PSYCH:  Alert and oriented to person, place and time. Affect is normal.  Labs/Imaging: CT abdomen/pelvis on 02/25/2022: IMPRESSION: 1. Anterior abdominal wall periumbilical defect containing omental fat without incarceration or obstruction are stable findings. 2. Small hiatal hernia. 3. Mid abdominal aortic aneurysm 3.4 cm. 4. Small pericardial effusion. 5. Numerous bilateral renal parenchymal lesions consistent with cysts that were seen on the prior study from 2020.  Assessment and Plan: This is a 82 y.o. female with an umbilical epigastric hernia associated with diastases recti.  - Discussed with patient the findings on her CT scan and overall her 2 hernias are stable and the diastases is also stable.  Discussed with her that given the measurements, I think is reasonable to proceed with a robotic assisted ventral hernia repair with plication of diastases recti.  Discussed with the patient that the plication would be simultaneous with the repair of the hernias and that we would use a mesh underneath to reinforce the repair.   Reviewed with her the surgery at length including the planned incisions, the risks of bleeding, infection, injury to surrounding structures, that this would be an outpatient procedure, the use of abdominal binder postoperatively, pain control, activity restrictions, and she is willing to proceed. - We will schedule her surgery for 03/30/2022.  We will also send for medical clearance in the meantime. - All of her questions have been answered.  I spent 40 minutes dedicated to the care of this patient on the date of this encounter to include pre-visit review of records, face-to-face time with the patient discussing diagnosis and management, and any post-visit coordination of care.   Melvyn Neth, [facility]

## 2022-03-08 NOTE — Patient Instructions (Signed)
Our surgery scheduler will call you within 24-48 hours to schedule your surgery. Please have the [facility] surgery sheet available when speaking with her.    Laparoscopic Ventral Hernia Repair Laparoscopic ventral hernia repairis a procedure to fix a bulge of tissue that pushes through a weak area of muscle in the abdomen (ventral hernia). A ventral hernia may be: Above the belly button. This is called an epigastric hernia. At the belly button. This is called an umbilical hernia. At the incision site from previous abdominal surgery. This is called an incisional hernia. You may have this procedure as emergency surgery if part of your intestine gets trapped inside the hernia and starts to lose its blood supply (strangulation). Laparoscopic surgery is done through small incisions using a thin surgical telescope with a light and camera (laparoscope). During surgery, your surgeon will use images from the laparoscope to guide the procedure. A mesh screen will be placed in the hernia to close the opening and strengthen the abdominal wall. Tell a health care provider about: Any allergies you have. All medicines you are taking, including vitamins, herbs, eye drops, creams, and over-the-counter medicines. Any problems you or family members have had with anesthetic medicines. Any blood disorders you have. Any surgeries you have had. Any medical conditions you have. Whether you are pregnant or may be pregnant. What are the risks? Generally, this is a safe procedure. However, problems may occur, including: Infection. Bleeding. Damage to nearby structures or organs in the abdomen. Trouble urinating or having a bowel movement after surgery. Blood clots. The hernia coming back after surgery. Fluid buildup in the area of the hernia. In some cases, your health care provider may need to switch from a laparoscopic procedure to a procedure that is done through a single, larger incision in the abdomen (open  procedure). You may need an open procedure if: You have a hernia that is difficult to repair. Your organs are hard to see with the laparoscope. You have bleeding problems during the laparoscopic procedure. What happens before the procedure? Staying hydrated Follow instructions from your health care provider about hydration, which may include: Up to 2 hours before the procedure - you may continue to drink clear liquids, such as water, clear fruit juice, black coffee, and plain tea.  Eating and drinking restrictions Follow instructions from your health care provider about eating and drinking, which may include: 8 hours before the procedure - stop eating heavy meals or foods, such as meat, fried foods, or fatty foods. 6 hours before the procedure - stop eating light meals or foods, such as toast or cereal. 6 hours before the procedure - stop drinking milk or drinks that contain milk. 2 hours before the procedure - stop drinking clear liquids. Medicines Ask your health care provider about: Changing or stopping your regular medicines. This is especially important if you are taking diabetes medicines or blood thinners. Taking medicines such as aspirin and ibuprofen. These medicines can thin your blood. Do not take these medicines unless your health care provider tells you to take them. Taking over-the-counter medicines, vitamins, herbs, and supplements. Tests You may need to have tests before the procedure, such as: Blood tests. Urine tests. Abdominal ultrasound. Chest X-ray. Electrocardiogram (ECG). General instructions You may be asked to take a laxative or do an enema to empty your bowel before surgery (bowel prep). Do not use any products that contain nicotine or tobacco for at least 4 weeks before the procedure. These products include cigarettes, chewing tobacco, and  vaping devices, such as e-cigarettes. If you need help quitting, ask your health care provider. Ask your health care  provider: How your surgery site will be marked. What steps will be taken to help prevent infection. These steps may include: Removing hair at the surgery site. Washing skin with a germ-killing soap. Receiving antibiotic medicine. Plan to have a responsible adult take you home from the hospital or clinic. If you will be going home right after the procedure, plan to have a responsible adult care for you for the time you are told. This is important. What happens during the procedure?  An IV will be inserted into one of your veins. You will be given one or more of the following: A medicine to help you relax (sedative). A medicine to numb the area (local anesthetic). A medicine to make you fall asleep (general anesthetic). A small incision will be made in your abdomen. A hollow metal tube (trocar) will be placed through the incision. A tube will be placed through the trocar to inflate your abdomen with carbon dioxide. This makes it easier for your surgeon to see inside your abdomen during the repair. A laparoscope will be inserted into your abdomen through the trocar. The laparoscope will send images to a monitor in the operating room. Other trocars will be put through other small incisions in your abdomen. The surgical instruments needed for the procedure will be placed through these trocars. The tissue or intestines that make up the hernia will be moved back into place. The edges of the hernia may be stitched (sutured) together. A piece of mesh will be used to close the hernia. Sutures, clips, or staples will be used to keep the mesh in place. A bandage (dressing) or skin glue will be put over the incisions. The procedure may vary among health care providers and hospitals. What happens after the procedure? Your blood pressure, heart rate, breathing rate, and blood oxygen level will be monitored until you leave the hospital or clinic. You will continue to receive fluids and medicines through an  IV. Your IV will be removed when you can drink clear fluids. You will be given pain medicine as needed. You will be encouraged to get up and walk around as soon as possible. You will be shown how to do deep breathing exercises to help prevent a lung infection. If you were given a sedative during the procedure, it can affect you for several hours. Do not drive or operate machinery until your health care provider says that it is safe. Summary Laparoscopic ventral hernia is an operation to fix a hernia using small incisions. Tell your health care provider about other medical conditions that you have and about all the medicines that you are taking. Follow instructions from your health care provider about eating and drinking before the procedure. Plan to have a responsible adult take you home from the hospital or clinic. After the procedure, you will be encouraged to walk as soon as possible. You will also be taught how to do deep breathing exercises. This information is not intended to replace advice given to you by your health care provider. Make sure you discuss any questions you have with your health care provider. Document Revised: 08/31/2019 Document Reviewed: 08/31/2019 Elsevier Patient Education  [place].

## 2022-03-09 ENCOUNTER — Telehealth: Payer: Self-pay | Admitting: Surgery

## 2022-03-09 ENCOUNTER — Telehealth: Payer: Self-pay

## 2022-03-09 NOTE — Telephone Encounter (Signed)
Not able to leave a message.  If patient calls back, please inform her of the following regarding scheduled surgery with Dr. Hampton Abbot.    Pre-Admission date/time, and Surgery date at [facility].  Surgery Date: 03/30/22 Preadmission Testing Date: 03/22/22 (phone 1p-5p)  Also patient will need to call at [phone], between 1-3:00pm the day before surgery, to find out what time to arrive for surgery.   '

## 2022-03-09 NOTE — Telephone Encounter (Signed)
Patient calls back, she is informed of all dates regarding her surgery.

## 2022-03-09 NOTE — Telephone Encounter (Signed)
Faxed medical clearance to Dr. Lavon Paganini at [phone].

## 2022-03-12 ENCOUNTER — Telehealth: Payer: Self-pay

## 2022-03-12 NOTE — Telephone Encounter (Signed)
Called patient. NA-not able to [facility] voicemail not activated. Patient need appointment for surgical Clearance. [place] for [facility] nurse to schedule patient.

## 2022-03-15 NOTE — Progress Notes (Unsigned)
I,Lashawndra Lampkins S Carlisia Geno,acting as a Education administrator for Lavon Paganini, MD.,have documented all relevant documentation on the behalf of Lavon Paganini, MD,as directed by  Lavon Paganini, MD while in the presence of Lavon Paganini, MD.     Established patient visit   Patient: Miranda Olsen   DOB: 06-21-1940   81 y.o. Female  MRN: [id number] Visit Date: 03/16/2022  Today's healthcare provider: Lavon Paganini, MD   No chief complaint on file.  Subjective     Subjective:  Miranda Olsen is a 82 y.o. female who presents to the office today for a preoperative consultation at the request of surgeon Dr. Olean Ree who plans on performing robotic assisted Ventral hernia on March 3. This consultation is requested for the specific conditions prompting preoperative evaluation (i.e. because of potential affect on operative risk): ***. Planned anesthesia: {local/regional/general:812}. The patient has the following known anesthesia issues: {anesthesia problems:16687}. Patients bleeding risk: {bleeding risk:16688}. Patient {does/does not:19097} have objections to receiving blood products if needed.  {Common ambulatory SmartLinks:19316}  Predictors of intubation difficulty:  Morbid obesity? {yes***/no:17258::no}  Anatomically abnormal facies? {yes***/no:17258::no}  Prominent incisors? {yes***/no:17258::no}  Receding mandible? {yes***/no:17258::no}  Short, thick neck? {yes***/no:17258::no}  Neck range of motion: {normal/abnormal:16337::normal}  Mallampati score: {score:16733}  Thyromental distance: {distance:16735}  Mouth opening: ***cm  Dentition: {exam; CZ:9801957  Cardiographics ECG: {findings; ecg:31282} Echocardiogram: {findings; AH:1864640  Imaging Chest x-ray: {findings; x-ray:12530}   Lab Review  {recent labs:19471}   Assessment:    82 y.o. female with planned surgery as above.   Known risk factors for perioperative complications: {risk AB-123456789   Difficulty  with intubation {is/is not:9024} anticipated.  Cardiac Risk Estimation: ***  Current medications which may produce withdrawal symptoms if withheld perioperatively: ***   Plan:  1. Preoperative workup as follows {studies; preop:16696}. 2. Change in medication regimen before surgery: {meds; preop:16697}. 3. Prophylaxis for cardiac events with perioperative beta-blockers: {preop beta-blockers:16698}. 4. Invasive hemodynamic monitoring perioperatively: {not indicated/strongly advised:16699}. 5. Deep vein thrombosis prophylaxis postoperatively:{dvt prophylaxis:16689}. 6. Surveillance for postoperative MI with ECG immediately postoperatively and on postoperative days 1 and 2 AND troponin levels 24 hours postoperatively and on day 4 or hospital discharge (whichever comes first): {not indicated/strongly advised:16699}. 7. Other measures: {preop recommendations:16691}     ***  Medications: Outpatient Medications Prior to Visit  Medication Sig   acetaminophen (TYLENOL) 325 MG tablet Take 650 mg by mouth every 6 (six) hours as needed.   amLODipine (NORVASC) 10 MG tablet Take 1 tablet (10 mg total) by mouth daily.   Blood Pressure KIT Use to check blood pressure as directed   cholecalciferol (VITAMIN D) 1000 UNITS tablet Take 1,000 Units by mouth daily. Pt taking 2000 daily   Cinnamon 500 MG capsule Take 500 mg by mouth daily.   Cyanocobalamin (VITAMIN B 12 PO) Take 2,000 Units by mouth daily.   doxazosin (CARDURA) 2 MG tablet TAKE 1 TABLET BY MOUTH EVERY DAY   Evolocumab (REPATHA SURECLICK) [id number] MG/ML SOAJ Inject 140 mg into the skin every 14 (fourteen) days.   ferrous sulfate 325 (65 FE) MG tablet TAKE 1 TABLET BY MOUTH EVERY DAY   Garlic 123XX123 MG CAPS Take by mouth daily.   losartan (COZAAR) 100 MG tablet Take 1 tablet (100 mg total) by mouth daily.   metFORMIN (GLUCOPHAGE) 500 MG tablet TAKE 1 TABLET BY MOUTH EVERY DAY WITH BREAKFAST   metoprolol succinate (TOPROL-XL) 50 MG 24 hr tablet TAKE  1 TABLET BY MOUTH EVERY DAY WITH OR IMMEDIATELY FOLLOWING A MEAL  Omega-3 1000 MG CAPS Take 2 capsules by mouth daily.   omeprazole (PRILOSEC) 40 MG capsule TAKE 1 CAPSULE BY MOUTH EVERY DAY   torsemide (DEMADEX) 10 MG tablet Take 1 tablet (10 mg total) by mouth every morning.   No facility-administered medications prior to visit.    Review of Systems  {Labs  Heme  Chem  Endocrine  Serology  Results Review (optional):23779}   Objective    There were no vitals taken for this visit. {Show previous vital signs (optional):23777}  Physical Exam  ***  No results found for any visits on 03/16/22.  Assessment & Plan     ***  No follow-ups on file.      {provider attestation***:1}   Lavon Paganini, MD  [facility] [phone] (phone) [phone] (fax)  [place]

## 2022-03-15 NOTE — Telephone Encounter (Signed)
Patient scheduled for 03/17/22.

## 2022-03-16 ENCOUNTER — Encounter: Payer: Self-pay | Admitting: Family Medicine

## 2022-03-16 ENCOUNTER — Ambulatory Visit (INDEPENDENT_AMBULATORY_CARE_PROVIDER_SITE_OTHER): Payer: 59 | Admitting: Family Medicine

## 2022-03-16 VITALS — BP 139/74 | HR 74 | Temp 97.8°F | Resp 24 | Ht 61.0 in | Wt 199.9 lb

## 2022-03-16 DIAGNOSIS — K429 Umbilical hernia without obstruction or gangrene: Secondary | ICD-10-CM

## 2022-03-16 DIAGNOSIS — E1122 Type 2 diabetes mellitus with diabetic chronic kidney disease: Secondary | ICD-10-CM

## 2022-03-16 DIAGNOSIS — M6208 Separation of muscle (nontraumatic), other site: Secondary | ICD-10-CM

## 2022-03-16 DIAGNOSIS — N1832 Chronic kidney disease, stage 3b: Secondary | ICD-10-CM | POA: Diagnosis not present

## 2022-03-16 DIAGNOSIS — Z01818 Encounter for other preprocedural examination: Secondary | ICD-10-CM

## 2022-03-16 DIAGNOSIS — N183 Chronic kidney disease, stage 3 unspecified: Secondary | ICD-10-CM | POA: Diagnosis not present

## 2022-03-17 LAB — CBC
Hematocrit: 33.3 % — ABNORMAL LOW (ref 34.0–46.6)
Hemoglobin: 10.8 g/dL — ABNORMAL LOW (ref 11.1–15.9)
MCH: 28.6 pg (ref 26.6–33.0)
MCHC: 32.4 g/dL (ref 31.5–35.7)
MCV: 88 fL (ref 79–97)
Platelets: 255 10*3/uL (ref 150–450)
RBC: 3.77 x10E6/uL (ref 3.77–5.28)
RDW: 12.6 % (ref 11.7–15.4)
WBC: 5.4 10*3/uL (ref 3.4–10.8)

## 2022-03-17 LAB — BASIC METABOLIC PANEL
BUN/Creatinine Ratio: 16 (ref 12–28)
BUN: 27 mg/dL (ref 8–27)
CO2: 21 mmol/L (ref 20–29)
Calcium: 9.4 mg/dL (ref 8.7–10.3)
Chloride: 102 mmol/L (ref 96–106)
Creatinine, Ser: 1.67 mg/dL — ABNORMAL HIGH (ref 0.57–1.00)
Glucose: 102 mg/dL — ABNORMAL HIGH (ref 70–99)
Potassium: 4.8 mmol/L (ref 3.5–5.2)
Sodium: 139 mmol/L (ref 134–144)
eGFR: 31 mL/min/{1.73_m2} — ABNORMAL LOW (ref >59)

## 2022-03-17 LAB — HEMOGLOBIN A1C
Est. average glucose Bld gHb Est-mCnc: 143 mg/dL
Hgb A1c MFr Bld: 6.6 % — ABNORMAL HIGH (ref 4.8–5.6)

## 2022-03-17 NOTE — Progress Notes (Unsigned)
Medical Clearance has been received from Dr Brita Romp. The patient is cleared at Medium risk for surgery.

## 2022-03-18 ENCOUNTER — Other Ambulatory Visit: Payer: Self-pay | Admitting: Family Medicine

## 2022-03-18 ENCOUNTER — Other Ambulatory Visit: Payer: Self-pay | Admitting: Cardiovascular Disease

## 2022-03-18 DIAGNOSIS — I129 Hypertensive chronic kidney disease with stage 1 through stage 4 chronic kidney disease, or unspecified chronic kidney disease: Secondary | ICD-10-CM

## 2022-03-18 DIAGNOSIS — K219 Gastro-esophageal reflux disease without esophagitis: Secondary | ICD-10-CM

## 2022-03-18 NOTE — Telephone Encounter (Signed)
Requested Prescriptions  Pending Prescriptions Disp Refills   omeprazole (PRILOSEC) 40 MG capsule [Pharmacy Med Name: OMEPRAZOLE DR 40 MG CAPSULE] 90 capsule 0    Sig: TAKE 1 CAPSULE BY MOUTH EVERY DAY     Gastroenterology: Proton Pump Inhibitors Passed - 03/18/2022  9:08 AM      Passed - Valid encounter within last 12 months    Recent Outpatient Visits           2 days ago Pre-op evaluation   [place], Dionne Bucy, MD   1 month ago Diastasis recti   [place], Dionne Bucy, MD   4 months ago Type 2 diabetes mellitus with stage 3b chronic kidney disease, without long-term current use of insulin [facility]   [place] Myles Gip, DO   11 months ago Encounter for [employer] annual wellness exam   [place], Dionne Bucy, MD   1 year ago Hypertension associated with diabetes [facility])   [place] Bacigal[place], Dionne Bucy, MD       Future Appointments             In 2 weeks Bacigalupo, Dionne Bucy, MD [facility]

## 2022-03-22 ENCOUNTER — Other Ambulatory Visit: Payer: Self-pay

## 2022-03-22 ENCOUNTER — Encounter
Admission: RE | Admit: 2022-03-22 | Discharge: 2022-03-22 | Disposition: A | Discharge status: Home / Self Care | Payer: 59 | Source: Ambulatory Visit | Attending: Surgery | Admitting: Surgery

## 2022-03-22 DIAGNOSIS — E1122 Type 2 diabetes mellitus with diabetic chronic kidney disease: Secondary | ICD-10-CM

## 2022-03-22 HISTORY — DX: Syncope and collapse: R55

## 2022-03-22 HISTORY — DX: Chronic kidney disease, unspecified: N18.9

## 2022-03-22 HISTORY — DX: Gastro-esophageal reflux disease without esophagitis: K21.9

## 2022-03-22 NOTE — Patient Instructions (Addendum)
Your procedure is scheduled on:  03/30/2022  Report to the Registration Desk on the 1st floor of the [facility].  To find out your arrival time, please call [phone] between 1PM - 3PM on: 03/29/2022  If your arrival time is 6:00 am, do not arrive before that time as the [facility] entrance doors do not open until 6:00 am.  REMEMBER: Instructions that are not followed completely may result in serious medical risk, up to and including death; or upon the discretion of your surgeon and anesthesiologist your surgery may need to be rescheduled.  Do not eat food after midnight the night before surgery.  No gum chewing or hard candies.  You may however, drink CLEAR liquids up to 2 hours before you are scheduled to arrive for your surgery. Do not drink anything within 2 hours of your scheduled arrival time.  Clear liquids include: - water   One week prior to surgery: Stop Anti-inflammatories (NSAIDS) such as Advil, Aleve, Ibuprofen, Motrin, Naproxen, Naprosyn and Aspirin based products such as Excedrin, Goody's Powder, BC Powder. Stop ANY OVER THE COUNTER supplements until after surgery garlic , omega, ferrus sulfate, B12, cinnamon You may however, continue to take Tylenol if needed for pain up until the day of surgery.  Continue taking all prescribed medications with the exception of the following:  Do not take metFORMIN (GLUCOPHAGE) on march 3rd, march 4th and march 5th.    TAKE ONLY THESE MEDICATIONS THE MORNING OF SURGERY WITH A SIP OF WATER:  doxazosin (CARDURA)  2.   omeprazole (PRILOSEC) (take one the night before and one on the morning of surgery - helps to prevent nausea after surgery.)   No Alcohol for 24 hours before or after surgery. No Smoking including e-cigarettes for 24 hours before surgery.  No chewable tobacco products for at least 6 hours before surgery.  No nicotine patches on the day of surgery.  Do not use any "recreational" drugs for at least a week  (preferably 2 weeks) before your surgery.  Please be advised that the combination of cocaine and anesthesia may have negative outcomes, up to and including death. If you test positive for cocaine, your surgery will be cancelled.  On the morning of surgery brush your teeth with toothpaste and water, you may rinse your mouth with mouthwash if you wish. Do not swallow any toothpaste or mouthwash.  Use CHG Soap as directed on instruction sheet.  Do not wear jewelry, make-up, hairpins, clips or nail polish.  Do not wear lotions, powders, or perfumes.   Do not shave body hair from the neck down 48 hours before surgery.  Contact lenses, hearing aids and dentures may not be worn into surgery.  Do not bring valuables to the hospital. [facility] is not responsible for any missing/lost belongings or valuables.   Notify your doctor if there is any change in your medical condition (cold, fever, infection).  Wear comfortable clothing (specific to your surgery type) to the hospital.  After surgery, you can help prevent lung complications by doing breathing exercises.  Take deep breaths and cough every 1-2 hours. Your doctor may order a device called an Incentive Spirometer to help you take deep breaths.  If you are being admitted to the hospital overnight, leave your suitcase in the car. After surgery it may be brought to your room.  In case of increased patient census, it may be necessary for you, the patient, to continue your postoperative care in the Same Day Surgery  department.  If you are being discharged the day of surgery, you will not be allowed to drive home. You will need a responsible individual to drive you home and stay with you for 24 hours after surgery.    Please call the [place] Dept. at [phone] if you have any questions about these instructions.  Surgery Visitation Policy:  Patients undergoing a surgery or procedure may have two family members or  support persons with them as long as the person is not COVID-19 positive or experiencing its symptoms.   Due to an increase in RSV and influenza rates and associated hospitalizations, children ages 39 and under will not be able to visit patients in [facility]. Masks continue to be strongly recommended.           Preparing for Surgery with CHLORHEXIDINE GLUCONATE (CHG) Soap  Chlorhexidine Gluconate (CHG) Soap  o An antiseptic cleaner that kills germs and bonds with the skin to continue killing germs even after washing  o Used for showering the night before surgery and morning of surgery  Before surgery, you can play an important role by reducing the number of germs on your skin.  CHG (Chlorhexidine gluconate) soap is an antiseptic cleanser which kills germs and bonds with the skin to continue killing germs even after washing.  Please do not use if you have an allergy to CHG or antibacterial soaps. If your skin becomes reddened/irritated stop using the CHG.  1. Shower the NIGHT BEFORE SURGERY and the MORNING OF SURGERY with CHG soap.  2. If you choose to wash your hair, wash your hair first as usual with your normal shampoo.  3. After shampooing, rinse your hair and body thoroughly to remove the shampoo.  4. Use CHG as you would any other liquid soap. You can apply CHG directly to the skin and wash gently with a scrungie or a clean washcloth.  5. Apply the CHG soap to your body only from the neck down. Do not use on open wounds or open sores. Avoid contact with your eyes, ears, mouth, and genitals (private parts). Wash face and genitals (private parts) with your normal soap.  6. Wash thoroughly, paying special attention to the area where your surgery will be performed.  7. Thoroughly rinse your body with warm water.  8. Do not shower/wash with your normal soap after using and rinsing off the CHG soap.  9. Pat yourself dry with a clean towel.  10. Wear clean  pajamas to bed the night before surgery.  12. Place clean sheets on your bed the night of your first shower and do not sleep with pets.  13. Shower again with the CHG soap on the day of surgery prior to arriving at the hospital.  14. Do not apply any deodorants/lotions/powders.  15. Please wear clean clothes to the hospital.

## 2022-03-30 ENCOUNTER — Ambulatory Visit
Admission: RE | Admit: 2022-03-30 | Discharge: 2022-03-30 | Disposition: A | Discharge status: Home / Self Care | Payer: 59 | Source: Ambulatory Visit | Attending: Surgery | Admitting: Surgery

## 2022-03-30 ENCOUNTER — Encounter: Payer: Self-pay | Admitting: Surgery

## 2022-03-30 ENCOUNTER — Other Ambulatory Visit: Payer: Self-pay

## 2022-03-30 ENCOUNTER — Encounter
Admission: RE | Disposition: A | Discharge status: Home / Self Care | Payer: Self-pay | Source: Ambulatory Visit | Attending: Surgery

## 2022-03-30 ENCOUNTER — Ambulatory Visit: Payer: 59 | Admitting: General Practice

## 2022-03-30 ENCOUNTER — Other Ambulatory Visit: Payer: Self-pay | Admitting: Physician Assistant

## 2022-03-30 DIAGNOSIS — M6208 Separation of muscle (nontraumatic), other site: Secondary | ICD-10-CM | POA: Diagnosis not present

## 2022-03-30 DIAGNOSIS — I3139 Other pericardial effusion (noninflammatory): Secondary | ICD-10-CM | POA: Insufficient documentation

## 2022-03-30 DIAGNOSIS — K449 Diaphragmatic hernia without obstruction or gangrene: Secondary | ICD-10-CM | POA: Diagnosis not present

## 2022-03-30 DIAGNOSIS — K429 Umbilical hernia without obstruction or gangrene: Secondary | ICD-10-CM | POA: Insufficient documentation

## 2022-03-30 DIAGNOSIS — K439 Ventral hernia without obstruction or gangrene: Secondary | ICD-10-CM | POA: Diagnosis not present

## 2022-03-30 DIAGNOSIS — E119 Type 2 diabetes mellitus without complications: Secondary | ICD-10-CM | POA: Insufficient documentation

## 2022-03-30 DIAGNOSIS — Z7984 Long term (current) use of oral hypoglycemic drugs: Secondary | ICD-10-CM | POA: Diagnosis not present

## 2022-03-30 DIAGNOSIS — I10 Essential (primary) hypertension: Secondary | ICD-10-CM | POA: Diagnosis not present

## 2022-03-30 DIAGNOSIS — K219 Gastro-esophageal reflux disease without esophagitis: Secondary | ICD-10-CM | POA: Insufficient documentation

## 2022-03-30 DIAGNOSIS — T7840XA Allergy, unspecified, initial encounter: Secondary | ICD-10-CM | POA: Diagnosis not present

## 2022-03-30 DIAGNOSIS — I714 Abdominal aortic aneurysm, without rupture, unspecified: Secondary | ICD-10-CM | POA: Insufficient documentation

## 2022-03-30 DIAGNOSIS — E1122 Type 2 diabetes mellitus with diabetic chronic kidney disease: Secondary | ICD-10-CM

## 2022-03-30 HISTORY — PX: XI ROBOTIC ASSISTED VENTRAL HERNIA: SHX6789

## 2022-03-30 HISTORY — PX: INSERTION OF MESH: SHX5868

## 2022-03-30 LAB — GLUCOSE, CAPILLARY
Glucose-Capillary: 106 mg/dL — ABNORMAL HIGH (ref 70–99)
Glucose-Capillary: 132 mg/dL — ABNORMAL HIGH (ref 70–99)

## 2022-03-30 SURGERY — REPAIR, HERNIA, VENTRAL, ROBOT-ASSISTED
Anesthesia: General | Site: Abdomen

## 2022-03-30 MED ORDER — EPHEDRINE 5 MG/ML INJ
INTRAVENOUS | Status: AC
  Filled 2022-03-30: qty 5

## 2022-03-30 MED ORDER — DEXAMETHASONE SODIUM PHOSPHATE 10 MG/ML IJ SOLN
INTRAMUSCULAR | Status: DC | PRN
  Administered 2022-03-30: 5 mg via INTRAVENOUS

## 2022-03-30 MED ORDER — CHLORHEXIDINE GLUCONATE CLOTH 2 % EX PADS
6.0000 | MEDICATED_PAD | Freq: Once | CUTANEOUS | Status: AC
  Administered 2022-03-30: 6 via TOPICAL

## 2022-03-30 MED ORDER — ACETAMINOPHEN 500 MG PO TABS: 1000.0000 mg | ORAL_TABLET | ORAL | Status: AC

## 2022-03-30 MED ORDER — FENTANYL CITRATE (PF) 100 MCG/2ML IJ SOLN
INTRAMUSCULAR | Status: DC | PRN
  Administered 2022-03-30: 25 ug via INTRAVENOUS
  Administered 2022-03-30: 75 ug via INTRAVENOUS

## 2022-03-30 MED ORDER — ONDANSETRON HCL 4 MG/2ML IJ SOLN
INTRAMUSCULAR | Status: DC | PRN
  Administered 2022-03-30: 4 mg via INTRAVENOUS

## 2022-03-30 MED ORDER — GLYCOPYRROLATE 0.2 MG/ML IJ SOLN
INTRAMUSCULAR | Status: AC
  Filled 2022-03-30: qty 1

## 2022-03-30 MED ORDER — SODIUM CHLORIDE (PF) 0.9 % IJ SOLN
INTRAMUSCULAR | Status: DC | PRN
  Administered 2022-03-30: 70 mL via INTRAMUSCULAR

## 2022-03-30 MED ORDER — DEXMEDETOMIDINE HCL IN NACL 200 MCG/50ML IV SOLN
INTRAVENOUS | Status: DC | PRN
  Administered 2022-03-30: 4 ug via INTRAVENOUS
  Administered 2022-03-30 (×2): 8 ug via INTRAVENOUS

## 2022-03-30 MED ORDER — GABAPENTIN 300 MG PO CAPS: 300.0000 mg | ORAL_CAPSULE | ORAL | Status: AC

## 2022-03-30 MED ORDER — GLYCOPYRROLATE 0.2 MG/ML IJ SOLN
INTRAMUSCULAR | Status: DC | PRN
  Administered 2022-03-30: .2 mg via INTRAVENOUS

## 2022-03-30 MED ORDER — ACETAMINOPHEN 500 MG PO TABS: 1000.0000 mg | ORAL_TABLET | Freq: Four times a day (QID) | ORAL | Status: DC | PRN

## 2022-03-30 MED ORDER — PROPOFOL 1000 MG/100ML IV EMUL
INTRAVENOUS | Status: AC
  Filled 2022-03-30: qty 100

## 2022-03-30 MED ORDER — BUPIVACAINE HCL (PF) 0.5 % IJ SOLN
INTRAMUSCULAR | Status: AC
  Filled 2022-03-30: qty 30

## 2022-03-30 MED ORDER — FENTANYL CITRATE (PF) 100 MCG/2ML IJ SOLN: 25.0000 ug | INTRAMUSCULAR | Status: DC | PRN

## 2022-03-30 MED ORDER — ONDANSETRON HCL 4 MG/2ML IJ SOLN
INTRAMUSCULAR | Status: AC
  Filled 2022-03-30: qty 2

## 2022-03-30 MED ORDER — OXYCODONE HCL 5 MG PO TABS: 5.0000 mg | ORAL_TABLET | Freq: Once | ORAL | Status: DC | PRN

## 2022-03-30 MED ORDER — ROCURONIUM BROMIDE 100 MG/10ML IV SOLN
INTRAVENOUS | Status: DC | PRN
  Administered 2022-03-30: 20 mg via INTRAVENOUS
  Administered 2022-03-30: 10 mg via INTRAVENOUS
  Administered 2022-03-30: 20 mg via INTRAVENOUS
  Administered 2022-03-30: 40 mg via INTRAVENOUS

## 2022-03-30 MED ORDER — ORAL CARE MOUTH RINSE: 15.0000 mL | Freq: Once | OROMUCOSAL | Status: AC

## 2022-03-30 MED ORDER — SUGAMMADEX SODIUM 200 MG/2ML IV SOLN
INTRAVENOUS | Status: DC | PRN
  Administered 2022-03-30: 200 mg via INTRAVENOUS

## 2022-03-30 MED ORDER — GABAPENTIN 300 MG PO CAPS
ORAL_CAPSULE | ORAL | Status: AC
  Administered 2022-03-30: 300 mg via ORAL
  Filled 2022-03-30: qty 1

## 2022-03-30 MED ORDER — PROPOFOL 10 MG/ML IV BOLUS
INTRAVENOUS | Status: AC
  Filled 2022-03-30: qty 20

## 2022-03-30 MED ORDER — EPINEPHRINE PF 1 MG/ML IJ SOLN
INTRAMUSCULAR | Status: AC
  Filled 2022-03-30: qty 1

## 2022-03-30 MED ORDER — CHLORHEXIDINE GLUCONATE 0.12 % MT SOLN
OROMUCOSAL | Status: AC
  Administered 2022-03-30: 15 mL via OROMUCOSAL
  Filled 2022-03-30: qty 15

## 2022-03-30 MED ORDER — OXYCODONE HCL 5 MG/5ML PO SOLN: 5.0000 mg | Freq: Once | ORAL | Status: DC | PRN

## 2022-03-30 MED ORDER — SODIUM CHLORIDE 0.9 % IV SOLN: INTRAVENOUS | Status: DC

## 2022-03-30 MED ORDER — LIDOCAINE HCL (CARDIAC) PF 100 MG/5ML IV SOSY
PREFILLED_SYRINGE | INTRAVENOUS | Status: DC | PRN
  Administered 2022-03-30: 40 mg via INTRAVENOUS

## 2022-03-30 MED ORDER — CHLORHEXIDINE GLUCONATE 0.12 % MT SOLN: 15.0000 mL | Freq: Once | OROMUCOSAL | Status: AC

## 2022-03-30 MED ORDER — CEFAZOLIN SODIUM-DEXTROSE 2-4 GM/100ML-% IV SOLN
INTRAVENOUS | Status: AC
  Filled 2022-03-30: qty 100

## 2022-03-30 MED ORDER — ROCURONIUM BROMIDE 10 MG/ML (PF) SYRINGE
PREFILLED_SYRINGE | INTRAVENOUS | Status: AC
  Filled 2022-03-30: qty 10

## 2022-03-30 MED ORDER — FENTANYL CITRATE (PF) 100 MCG/2ML IJ SOLN
INTRAMUSCULAR | Status: AC
  Filled 2022-03-30: qty 2

## 2022-03-30 MED ORDER — PROPOFOL 500 MG/50ML IV EMUL
INTRAVENOUS | Status: DC | PRN
  Administered 2022-03-30: 125 ug/kg/min via INTRAVENOUS

## 2022-03-30 MED ORDER — BUPIVACAINE LIPOSOME 1.3 % IJ SUSP: 20.0000 mL | Freq: Once | INTRAMUSCULAR | Status: DC

## 2022-03-30 MED ORDER — OXYCODONE HCL 5 MG PO TABS: 5.0000 mg | ORAL_TABLET | ORAL | 0 refills | Status: DC | PRN

## 2022-03-30 MED ORDER — ACETAMINOPHEN 500 MG PO TABS
ORAL_TABLET | ORAL | Status: AC
  Administered 2022-03-30: 1000 mg via ORAL
  Filled 2022-03-30: qty 2

## 2022-03-30 MED ORDER — SODIUM CHLORIDE FLUSH 0.9 % IV SOLN
INTRAVENOUS | Status: AC
  Filled 2022-03-30: qty 20

## 2022-03-30 MED ORDER — BUPIVACAINE LIPOSOME 1.3 % IJ SUSP
INTRAMUSCULAR | Status: AC
  Filled 2022-03-30: qty 20

## 2022-03-30 MED ORDER — 0.9 % SODIUM CHLORIDE (POUR BTL) OPTIME
TOPICAL | Status: DC | PRN
  Administered 2022-03-30: 500 mL

## 2022-03-30 MED ORDER — DEXAMETHASONE SODIUM PHOSPHATE 10 MG/ML IJ SOLN
INTRAMUSCULAR | Status: AC
  Filled 2022-03-30: qty 1

## 2022-03-30 MED ORDER — PROPOFOL 10 MG/ML IV BOLUS
INTRAVENOUS | Status: DC | PRN
  Administered 2022-03-30: 150 mg via INTRAVENOUS

## 2022-03-30 MED ORDER — EPHEDRINE SULFATE (PRESSORS) 50 MG/ML IJ SOLN
INTRAMUSCULAR | Status: DC | PRN
  Administered 2022-03-30: 10 mg via INTRAVENOUS

## 2022-03-30 MED ORDER — CEFAZOLIN SODIUM-DEXTROSE 2-4 GM/100ML-% IV SOLN
2.0000 g | INTRAVENOUS | Status: AC
  Administered 2022-03-30: 2 g via INTRAVENOUS

## 2022-03-30 MED ORDER — LACTATED RINGERS IV SOLN: INTRAVENOUS | Status: DC | PRN

## 2022-03-30 SURGICAL SUPPLY — 65 items
ADH SKN CLS APL DERMABOND .7 (GAUZE/BANDAGES/DRESSINGS) ×1
BINDER ABDOMINAL 12 ML 46-62 (SOFTGOODS) IMPLANT
BLADE SURG SZ11 CARB STEEL (BLADE) ×1 IMPLANT
CANNULA REDUC XI 12-8 STAPL (CANNULA) ×1
CANNULA REDUCER 12-8 DVNC XI (CANNULA) ×1 IMPLANT
COVER TIP SHEARS 8 DVNC (MISCELLANEOUS) ×1 IMPLANT
COVER TIP SHEARS 8MM DA VINCI (MISCELLANEOUS) ×1
COVER WAND RF STERILE (DRAPES) ×1 IMPLANT
DERMABOND ADVANCED .7 DNX12 (GAUZE/BANDAGES/DRESSINGS) ×1 IMPLANT
DRAPE ARM DVNC X/XI (DISPOSABLE) ×3 IMPLANT
DRAPE COLUMN DVNC XI (DISPOSABLE) ×1 IMPLANT
DRAPE DA VINCI XI ARM (DISPOSABLE) ×3
DRAPE DA VINCI XI COLUMN (DISPOSABLE) ×1
ELECT CAUTERY BLADE TIP 2.5 (TIP) ×1
ELECT REM PT RETURN 9FT ADLT (ELECTROSURGICAL) ×1
ELECTRODE CAUTERY BLDE TIP 2.5 (TIP) ×1 IMPLANT
ELECTRODE REM PT RTRN 9FT ADLT (ELECTROSURGICAL) ×1 IMPLANT
GLOVE SURG SYN 7.0 (GLOVE) ×2 IMPLANT
GLOVE SURG SYN 7.0 PF PI (GLOVE) ×2 IMPLANT
GLOVE SURG SYN 7.5  E (GLOVE) ×2
GLOVE SURG SYN 7.5 E (GLOVE) ×2 IMPLANT
GLOVE SURG SYN 7.5 PF PI (GLOVE) ×2 IMPLANT
GOWN STRL REUS W/ TWL LRG LVL3 (GOWN DISPOSABLE) ×3 IMPLANT
GOWN STRL REUS W/TWL LRG LVL3 (GOWN DISPOSABLE) ×5
GRASPER SUT TROCAR 14GX15 (MISCELLANEOUS) ×1 IMPLANT
IRRIGATION STRYKERFLOW (MISCELLANEOUS) IMPLANT
IRRIGATOR STRYKERFLOW (MISCELLANEOUS)
IV NS 1000ML (IV SOLUTION)
IV NS 1000ML BAXH (IV SOLUTION) IMPLANT
KIT PINK PAD W/HEAD ARE REST (MISCELLANEOUS) ×1
KIT PINK PAD W/HEAD ARM REST (MISCELLANEOUS) ×1 IMPLANT
LABEL OR SOLS (LABEL) ×1 IMPLANT
MANIFOLD NEPTUNE II (INSTRUMENTS) ×1 IMPLANT
MESH VENT LT ST 15CM CRL ECHO2 (Mesh General) IMPLANT
NDL INSUFFLATION 14GA 120MM (NEEDLE) ×1 IMPLANT
NEEDLE HYPO 22GX1.5 SAFETY (NEEDLE) ×1 IMPLANT
NEEDLE INSUFFLATION 14GA 120MM (NEEDLE) ×1 IMPLANT
NS IRRIG 500ML POUR BTL (IV SOLUTION) IMPLANT
OBTURATOR OPTICAL STANDARD 8MM (TROCAR) ×1
OBTURATOR OPTICAL STND 8 DVNC (TROCAR) ×1
OBTURATOR OPTICALSTD 8 DVNC (TROCAR) ×1 IMPLANT
PACK LAP CHOLECYSTECTOMY (MISCELLANEOUS) ×1 IMPLANT
PENCIL SMOKE EVACUATOR (MISCELLANEOUS) ×1 IMPLANT
SEAL CANN UNIV 5-8 DVNC XI (MISCELLANEOUS) ×2 IMPLANT
SEAL XI 5MM-8MM UNIVERSAL (MISCELLANEOUS) ×2
SET TUBE FILTERED XL AIRSEAL (SET/KITS/TRAYS/PACK) IMPLANT
SET TUBE SMOKE EVAC HIGH FLOW (TUBING) ×1 IMPLANT
SOL ELECTROSURG ANTI STICK (MISCELLANEOUS) ×1
SOLUTION ELECTROSURG ANTI STCK (MISCELLANEOUS) ×1 IMPLANT
SPONGE T-LAP 18X18 ~~LOC~~+RFID (SPONGE) ×1 IMPLANT
STAPLER CANNULA SEAL DVNC XI (STAPLE) ×1 IMPLANT
STAPLER CANNULA SEAL XI (STAPLE) ×1
SUT MNCRL 4-0 (SUTURE) ×2
SUT MNCRL 4-0 27XMFL (SUTURE) ×2
SUT STRATAFIX PDS 30 CT-1 (SUTURE) ×1 IMPLANT
SUT VIC AB 3-0 SH 27 (SUTURE) ×1
SUT VIC AB 3-0 SH 27X BRD (SUTURE) IMPLANT
SUT VICRYL 0 UR6 27IN ABS (SUTURE) ×2 IMPLANT
SUT VLOC 90 S/L VL9 GS22 (SUTURE) IMPLANT
SUTURE MNCRL 4-0 27XMF (SUTURE) ×1 IMPLANT
SYS BAG RETRIEVAL 10MM (BASKET) ×1
SYSTEM BAG RETRIEVAL 10MM (BASKET) IMPLANT
TAPE TRANSPORE STRL 2 31045 (GAUZE/BANDAGES/DRESSINGS) ×1 IMPLANT
TRAY FOLEY SLVR 16FR LF STAT (SET/KITS/TRAYS/PACK) ×1 IMPLANT
WATER STERILE IRR 500ML POUR (IV SOLUTION) ×1 IMPLANT

## 2022-03-30 NOTE — Anesthesia Procedure Notes (Signed)
Procedure Name: Intubation Date/Time: 03/30/2022 11:05 AM  Performed by: Hilbert Odor, CRNAPre-anesthesia Checklist: Patient identified, Patient being monitored, Timeout performed, Emergency Drugs available and Suction available Patient Re-evaluated:Patient Re-evaluated prior to induction Oxygen Delivery Method: Circle system utilized Preoxygenation: Pre-oxygenation with 100% oxygen Induction Type: IV induction Ventilation: Mask ventilation without difficulty Laryngoscope Size: 3 and McGraph Grade View: Grade I Tube type: Oral Tube size: 7.0 mm Number of attempts: 1 Airway Equipment and Method: Stylet Placement Confirmation: ETT inserted through vocal cords under direct vision, positive ETCO2 and breath sounds checked- equal and bilateral Secured at: 23 cm Tube secured with: Tape Dental Injury: Teeth and Oropharynx as per pre-operative assessment

## 2022-03-30 NOTE — Op Note (Signed)
Procedure Date:  03/30/2022  Pre-operative Diagnosis:  Umbilical, epigastric hernias and diastasis recti  Post-operative Diagnosis: Umbilical and epigastric hernias, total defect length 7 cm; diastasis recti.  Procedure:  Robotic assisted Ventral Hernia Repair with mesh and plication of diastasis recti.  Surgeon:  Melvyn Neth, MD  Anesthesia:  General endotracheal  Estimated Blood Loss:  15 ml  Specimens:  None  Complications:  None  Indications for Procedure:  This is a 82 y.o. female who presents with an umbilical and epigastric hernia in the setting of diastasis recti.  The options of surgery versus observation were reviewed with the patient and/or family. The risks of bleeding, abscess or infection, recurrence of symptoms, potential for an open procedure, injury to surrounding structures, and chronic pain were all discussed with the patient and was willing to proceed.  Description of Procedure: The patient was correctly identified in the preoperative area and brought into the operating room.  The patient was placed supine with VTE prophylaxis in place.  Appropriate time-outs were performed.  Anesthesia was induced and the patient was intubated.  Appropriate antibiotics were infused.  The abdomen was prepped and draped in a sterile fashion. The patient's hernia defects were marked with a marking pen.  A Veress needle was introduced in the left upper quadrant and pneumoperitoneum was obtained with appropriate pressures.  Using Optiview technique, an 8 mm port was introduced in the left lateral abdominal wall without complications.  Then, a 12 mm port was introduced in the left upper quadrant and an 8 mm port in the left lower quadrant under direct visualization.  A 4 x 6 inch Bard Ventralight ST Echo mesh, two 0 Stratafix suture, and four 2-0 V-loc sutures were inserted through the 12 mm port under direct visualization.  The DaVinci platform was docked, camera targeted, and instruments  placed under direct visualization.  The patient's hernias were fully reduced and the peritoneum and preperitoneal fat were dissected and resected to allow better exposure of the hernia defect and for better mesh placement.  The patient was confirmed to have two hernia defects, at the umbilicus and the epigastric regions.  The total length of the defects combined was 7 cm.  The patient also had diastasis recti of about 7 cm at its widest portion, going from high epigastric area to just inferior to the umbilicus.  The hernia defects were closed using the stratafix suture and in doing so, the diastasis recti was plicated from end to end at the same time.  A PMI was brought through the center of the hernia defects and the positioning system of the mesh was passed through.  This allowed the mesh to splay open and be centered over the repair site with good overlap.  The mesh was then sutured in place circumferentially and through the center of the mesh using the V-loc sutures.  All needles and the positioning system were then removed through the 12 mm port without complications.  The preperitoneal fat was placed in an Endocatch bag.  The DaVinci platform was then undocked and instruments removed.    70 ml of Exparel solution mixed with 0.5% bupivacaine with epi was infiltrated around the mesh edges, hernia repair site, and port sites.  The 12 mm port was removed and the endocatch bag retrieved.  The fascia was closed under direct visualization utilizing an Endo Close technique with 0 Vicryl suture.  The 8 mm ports were removed. The 12 mm incision was closed using 3-0 Vicryl and 4-0  Monocryl, and the other port incisions were closed with 4-0 Monocryl.  The wounds were cleaned and sealed with DermaBond.  The patient was emerged from anesthesia and extubated and brought to the recovery room for further management.  The patient tolerated the procedure well and all counts were correct at the end of the case.   Melvyn Neth, MD

## 2022-03-30 NOTE — Transfer of Care (Signed)
Immediate Anesthesia Transfer of Care Note  Patient: Miranda Olsen  Procedure(s) Performed: XI ROBOTIC ASSISTED VENTRAL HERNIA & plication of diastatis recti (Abdomen) INSERTION OF MESH (Abdomen)  Patient Location: PACU  Anesthesia Type:General  Level of Consciousness: awake, drowsy, and patient cooperative  Airway & Oxygen Therapy: Patient Spontanous Breathing and Patient connected to nasal cannula oxygen  Post-op Assessment: Report given to RN, Post -op Vital signs reviewed and stable, and Patient moving all extremities X 4  Post vital signs: Reviewed and stable  Last Vitals:  Vitals Value Taken Time  BP 103/67 03/30/22 1348  Temp    Pulse 66 03/30/22 1354  Resp 16 03/30/22 1354  SpO2 95 % 03/30/22 1354  Vitals shown include unvalidated device data.  Last Pain:  Vitals:   03/30/22 1009  TempSrc: Temporal  PainSc: 0-No pain      Patients Stated Pain Goal: 0 ([id number])  Complications: No notable events documented.

## 2022-03-30 NOTE — Discharge Instructions (Addendum)
Discharge Instructions: 1.  Patient may shower, but do not scrub wounds heavily and dab dry only. 2.  Do not submerge wounds in pool/tub until fully healed. 3.  Do not apply ointments or hydrogen peroxide to the wounds. 4.  May apply ice packs to the wounds for comfort. 5.  Please wear the abdominal binder at much as possible for the next 4 weeks.  It is ok to sleep without it, but if tolerable, better to sleep with it on. 6.  No heavy lifting or pushing of more than 10-15 lbs for 6 weeks. 7.  Do not drive while taking narcotics for pain control.  Prior to driving, make sure you are able to rotate right and left to look at blindspots without significant pain or discomfort.    AMBULATORY SURGERY  DISCHARGE INSTRUCTIONS   The drugs that you were given will stay in your system until tomorrow so for the next 24 hours you should not:  Drive an automobile Make any legal decisions Drink any alcoholic beverage  You may resume regular meals tomorrow.  Today it is better to start with liquids and gradually work up to solid foods.  You may eat anything you prefer, but it is better to start with liquids, then soup and crackers, and gradually work up to solid foods.  Please notify your doctor immediately if you have any unusual bleeding, trouble breathing, redness and pain at the surgery site, drainage, fever, or pain not relieved by medication.  Additional Instructions:  Please contact your physician with any problems or Same Day Surgery at [phone], Monday through Friday 6 am to 4 pm, or [place] at [facility] number at [phone].

## 2022-03-30 NOTE — Interval H&P Note (Signed)
History and Physical Interval Note:  03/30/2022 10:36 AM  Miranda Olsen Numbers  has presented today for surgery, with the diagnosis of ventral hernia redicible 3-10 cm.  The various methods of treatment have been discussed with the patient and family. After consideration of risks, benefits and other options for treatment, the patient has consented to  Procedure(s): XI ROBOTIC ASSISTED VENTRAL HERNIA & plication of diastatis recti (N/A) as a surgical intervention.  The patient's history has been reviewed, patient examined, no change in status, stable for surgery.  I have reviewed the patient's chart and labs.  Questions were answered to the patient's satisfaction.     Ladarrian Asencio

## 2022-03-30 NOTE — Anesthesia Preprocedure Evaluation (Addendum)
Anesthesia Evaluation  Patient identified by MRN, date of birth, ID band Patient awake    Reviewed: Allergy & Precautions, NPO status , Patient's Chart, lab work & pertinent test results  History of Anesthesia Complications (+) PONV and history of anesthetic complications  Airway Mallampati: IV  TM Distance: >3 FB Neck ROM: full    Dental  (+) Dental Advidsory Given, Chipped, Missing   Pulmonary neg pulmonary ROS, neg sleep apnea, neg COPD   Pulmonary exam normal breath sounds clear to auscultation       Cardiovascular hypertension, negative cardio ROS Normal cardiovascular exam Rhythm:Regular     Neuro/Psych negative neurological ROS  negative psych ROS   GI/Hepatic Neg liver ROS,GERD  ,,  Endo/Other  negative endocrine ROSdiabetes    Renal/GU CRFRenal disease     Musculoskeletal   Abdominal  (+) + obese  Peds  Hematology negative hematology ROS (+)   Anesthesia Other Findings Past Medical History: No date: Chronic kidney disease No date: Diabetes mellitus without complication (HCC) No date: GERD (gastroesophageal reflux disease) No date: Hypercholesterolemia No date: Hypertension No date: Syncope  Past Surgical History: 01/27/2015: COLONOSCOPY WITH PROPOFOL; N/A     Comment:  Procedure: COLONOSCOPY WITH PROPOFOL;  Surgeon: Josefine Class, MD;  Location: [facility] ENDOSCOPY;  Service:               Endoscopy;  Laterality: N/A; 01/21/2009 : EYE SURGERY; Left     Comment:  cataract surgery  04/08/2009: EYE SURGERY; Right     Comment:  cataract surgery 10/08/2009: REPLACEMENT TOTAL KNEE; Left     Comment:  Dr. Marry Guan     Reproductive/Obstetrics negative OB ROS                             Anesthesia Physical Anesthesia Plan  ASA: 3  Anesthesia Plan: General ETT   Post-op Pain Management:    Induction: Intravenous  PONV Risk Score and Plan: 4 or greater and  TIVA and Propofol infusion  Airway Management Planned: Oral ETT  Additional Equipment:   Intra-op Plan:   Post-operative Plan: Extubation in OR  Informed Consent: I have reviewed the patients History and Physical, chart, labs and discussed the procedure including the risks, benefits and alternatives for the proposed anesthesia with the patient or authorized representative who has indicated his/her understanding and acceptance.     Dental Advisory Given  Plan Discussed with: Anesthesiologist, CRNA and Surgeon  Anesthesia Plan Comments: (Patient consented for risks of anesthesia including but not limited to:  - adverse reactions to medications - damage to eyes, teeth, lips or other oral mucosa - nerve damage due to positioning  - sore throat or hoarseness - Damage to heart, brain, nerves, lungs, other parts of body or loss of life  Patient voiced understanding.)        Anesthesia Quick Evaluation

## 2022-03-31 ENCOUNTER — Encounter: Payer: Self-pay | Admitting: Surgery

## 2022-04-01 ENCOUNTER — Other Ambulatory Visit: Payer: Self-pay | Admitting: Cardiovascular Disease

## 2022-04-01 DIAGNOSIS — I129 Hypertensive chronic kidney disease with stage 1 through stage 4 chronic kidney disease, or unspecified chronic kidney disease: Secondary | ICD-10-CM

## 2022-04-01 NOTE — Anesthesia Postprocedure Evaluation (Signed)
Anesthesia Post Note  Patient: Miranda Olsen  Procedure(s) Performed: XI ROBOTIC ASSISTED VENTRAL HERNIA & plication of diastatis recti (Abdomen) INSERTION OF MESH (Abdomen)  Patient location during evaluation: PACU Anesthesia Type: General Level of consciousness: awake and alert Pain management: pain level controlled Vital Signs Assessment: post-procedure vital signs reviewed and stable Respiratory status: spontaneous breathing, nonlabored ventilation and respiratory function stable Cardiovascular status: blood pressure returned to baseline and stable Postop Assessment: no apparent nausea or vomiting Anesthetic complications: no   No notable events documented.   Last Vitals:  Vitals:   03/30/22 1419 03/30/22 1432  BP:  (!) 144/80  Pulse: 68   Resp: 16 18  Temp: (!) 36.1 C (!) 36.3 C  SpO2: 95% 94%    Last Pain:  Vitals:   03/30/22 1432  TempSrc: Temporal  PainSc: 0-No pain                 Alphonsus Sias

## 2022-04-05 ENCOUNTER — Ambulatory Visit: Payer: 59 | Admitting: Family Medicine

## 2022-04-14 ENCOUNTER — Encounter: Payer: Self-pay | Admitting: Surgery

## 2022-04-14 ENCOUNTER — Telehealth: Payer: Self-pay | Admitting: Family Medicine

## 2022-04-14 ENCOUNTER — Ambulatory Visit (INDEPENDENT_AMBULATORY_CARE_PROVIDER_SITE_OTHER): Payer: 59 | Admitting: Surgery

## 2022-04-14 VITALS — BP 147/80 | HR 69 | Temp 98.3°F | Ht 61.0 in | Wt 202.0 lb

## 2022-04-14 DIAGNOSIS — Z09 Encounter for follow-up examination after completed treatment for conditions other than malignant neoplasm: Secondary | ICD-10-CM

## 2022-04-14 DIAGNOSIS — M6208 Separation of muscle (nontraumatic), other site: Secondary | ICD-10-CM | POA: Diagnosis not present

## 2022-04-14 DIAGNOSIS — K439 Ventral hernia without obstruction or gangrene: Secondary | ICD-10-CM

## 2022-04-14 DIAGNOSIS — K429 Umbilical hernia without obstruction or gangrene: Secondary | ICD-10-CM | POA: Diagnosis not present

## 2022-04-14 NOTE — Patient Instructions (Addendum)
After next week you may remove your abdominal binder. Follow-up with our office as needed.  Please call and ask to speak with a nurse if you develop questions or concerns.   GENERAL POST-OPERATIVE PATIENT INSTRUCTIONS   WOUND CARE INSTRUCTIONS: Try to keep the wound dry and avoid ointments on the wound unless directed to do so.  If the wound becomes bright red and painful or starts to drain infected material that is not clear, please contact your physician immediately.  If the wound is mildly pink and has a thick firm ridge underneath it, this is normal, and is referred to as a healing ridge.  This will resolve over the next 4-6 weeks.  BATHING: You may shower if you have been informed of this by your surgeon. However, Please do not submerge in a tub, hot tub, or pool until incisions are completely sealed or have been told by your surgeon that you may do so.  DIET:  You may eat any foods that you can tolerate.  It is a good idea to eat a high fiber diet and take in plenty of fluids to prevent constipation.  If you do become constipated you may want to take a mild laxative or take ducolax tablets on a daily basis until your bowel habits are regular.  Constipation can be very uncomfortable, along with straining, after recent surgery.  ACTIVITY: You should not lift more than 20 pounds for 6 weeks total after surgery as it could put you at increased risk for complications.  Twenty pounds is roughly equivalent to a plastic bag of groceries. At that time- Listen to your body when lifting, if you have pain when lifting, stop and then try again in a few days. Soreness after doing exercises or activities of daily living is normal as you get back in to your normal routine.  MEDICATIONS:  Try to take narcotic medications and anti-inflammatory medications, such as tylenol, ibuprofen, naprosyn, etc., with food.  This will minimize stomach upset from the medication.  Should you develop nausea and vomiting from  the pain medication, or develop a rash, please discontinue the medication and contact your physician.  You should not drive, make important decisions, or operate machinery when taking narcotic pain medication.  SUNBLOCK Use sun block to incision area over the next year if this area will be exposed to sun. This helps decrease scarring and will allow you avoid a permanent darkened area over your incision.  QUESTIONS:  Please feel free to call our office if you have any questions, and we will be glad to assist you. [phone]

## 2022-04-14 NOTE — Progress Notes (Signed)
04/14/2022  History of Present Illness: Miranda Olsen is a 82 y.o. female status post robotic assisted ventral hernia repair and plication of diastases recti on 03/30/2022.  Patient had total defect length of 7 cm.  She presents today for follow-up.  She reports she has been doing well and denies any significant pain.  She has not needed any oxycodone and has been taking Tylenol and or ibuprofen.  Denies any troubles with the incisions and denies any hernia recurrence.  She has been wearing her abdominal binder as instructed.  She is eating well and having normal bowel function.  Past Medical History: Past Medical History:  Diagnosis Date   Chronic kidney disease    Diabetes mellitus without complication (HCC)    GERD (gastroesophageal reflux disease)    Hypercholesterolemia    Hypertension    Syncope      Past Surgical History: Past Surgical History:  Procedure Laterality Date   COLONOSCOPY WITH PROPOFOL N/A 01/27/2015   Procedure: COLONOSCOPY WITH PROPOFOL;  Surgeon: Josefine Class, MD;  Location: [facility] ENDOSCOPY;  Service: Endoscopy;  Laterality: N/A;   EYE SURGERY Left 01/21/2009    cataract surgery    EYE SURGERY Right 04/08/2009   cataract surgery   INSERTION OF MESH N/A 03/30/2022   Procedure: INSERTION OF MESH;  Surgeon: Olean Ree, MD;  Location: [facility] ORS;  Service: General;  Laterality: N/A;   REPLACEMENT TOTAL KNEE Left 10/08/2009   Dr. Marry Guan   XI ROBOTIC ASSISTED VENTRAL HERNIA N/A 03/30/2022   Procedure: XI ROBOTIC ASSISTED VENTRAL HERNIA & plication of diastatis recti;  Surgeon: Olean Ree, MD;  Location: [facility] ORS;  Service: General;  Laterality: N/A;    Home Medications: Prior to Admission medications   Medication Sig Start Date End Date Taking? Authorizing Provider  acetaminophen (TYLENOL) 325 MG tablet Take 650 mg by mouth every 6 (six) hours as needed.   Yes [provider]  acetaminophen (TYLENOL) 500 MG tablet Take 2 tablets (1,000 mg total) by  mouth every 6 (six) hours as needed for mild pain. 03/30/22  Yes Beila Purdie, Jacqulyn Bath, MD  amLODipine (NORVASC) 10 MG tablet Take 1 tablet (10 mg total) by mouth daily. 02/02/22  Yes Virginia Crews, MD  Blood Pressure KIT Use to check blood pressure as directed 10/31/20  Yes Bacigalupo, Dionne Bucy, MD  cholecalciferol (VITAMIN D) 1000 UNITS tablet Take 1,000 Units by mouth daily. Pt taking 2000 daily 03/25/12  Yes [provider]  Cinnamon 500 MG capsule Take 500 mg by mouth daily.   Yes [provider]  Cyanocobalamin (VITAMIN B 12 PO) Take 2,000 Units by mouth daily.   Yes [provider]  doxazosin (CARDURA) 2 MG tablet TAKE 1 TABLET BY MOUTH EVERY DAY 12/02/21  Yes Bacigalupo, Dionne Bucy, MD  Evolocumab (REPATHA SURECLICK) [id number] MG/ML SOAJ Inject 140 mg into the skin every 14 (fourteen) days. 12/01/21  Yes Minna Merritts, MD  ferrous sulfate 325 (65 FE) MG tablet TAKE 1 TABLET BY MOUTH EVERY DAY 04/29/19  Yes Mar Daring, PA-C  Garlic 123XX123 MG CAPS Take by mouth daily.   Yes [provider]  losartan (COZAAR) 100 MG tablet TAKE 1 TABLET BY MOUTH EVERY DAY 03/18/22  Yes Gollan, Kathlene November, MD  metFORMIN (GLUCOPHAGE) 500 MG tablet TAKE 1 TABLET BY MOUTH EVERY DAY WITH BREAKFAST 02/26/22  Yes Bacigalupo, Dionne Bucy, MD  metoprolol succinate (TOPROL-XL) 50 MG 24 hr tablet TAKE 1 TABLET BY MOUTH EVERY DAY WITH OR IMMEDIATELY  FOLLOWING A MEAL 04/01/22  Yes Gollan, Kathlene November, MD  Omega-3 1000 MG CAPS Take 2 capsules by mouth daily.   Yes [provider]  omeprazole (PRILOSEC) 40 MG capsule TAKE 1 CAPSULE BY MOUTH EVERY DAY 03/18/22  Yes Bacigalupo, Dionne Bucy, MD  torsemide (DEMADEX) 10 MG tablet Take 1 tablet (10 mg total) by mouth every morning. Patient taking differently: Take 10 mg by mouth every other day. 12/01/21  Yes Minna Merritts, MD    Allergies: Allergies  Allergen Reactions   Clonidine Derivatives Other (See Comments)   Amoxicillin Swelling    Review  of Systems: Review of Systems  Constitutional:  Negative for chills and fever.  Respiratory:  Negative for shortness of breath.   Cardiovascular:  Negative for chest pain.  Gastrointestinal:  Negative for abdominal pain, constipation, diarrhea, nausea and vomiting.    Physical Exam BP (!) 147/80   Pulse 69   Temp 98.3 F (36.8 C)   Ht 5\' 1"  (1.549 m)   Wt 202 lb (91.6 kg)   SpO2 97%   BMI 38.17 kg/m  CONSTITUTIONAL: No acute distress, well-nourished HEENT:  Normocephalic, atraumatic, extraocular motion intact. RESPIRATORY:  Normal respiratory effort without pathologic use of accessory muscles. CARDIOVASCULAR: Regular rhythm and rate. GI: The abdomen is soft, non-distended, non-tender to palpation. Incisions are healing well and are clean, dry, intact.  There is minimal ecchymosis.  No evidence of infection.  No evidence of hernia recurrence.  Abdominal binder placed.  NEUROLOGIC:  Motor and sensation is grossly normal.  Cranial nerves are grossly intact. PSYCH:  Alert and oriented to person, place and time. Affect is normal.   Assessment and Plan: This is a 82 y.o. female status post robotic assisted ventral hernia repair and plication of diastases recti.  - Patient is healing very well from her surgery and is not having any complications.  Pain is well-controlled with only Tylenol.  No evidence of hernia recurrence. - Reminded patient of activity restrictions for total of 4 weeks after surgery as well as using abdominal binder for a total of 4 weeks. - Follow-up as needed.  I spent 20 minutes dedicated to the care of this patient on the date of this encounter to include pre-visit review of records, face-to-face time with the patient discussing diagnosis and management, and any post-visit coordination of care.   Melvyn Neth, [facility]

## 2022-04-14 NOTE — Telephone Encounter (Signed)
Called patient to RE-schedule Medicare Annual Wellness Visit (AWV). Unable to reach patient.  RE-SCHEDULE AWV ON: 04/19/2022 PER PATIENT  Please schedule an appointment at any time with [facility].  If any questions, please contact me at [phone].  Thank you ,  [place] Direct Dial: [phone]

## 2022-04-15 ENCOUNTER — Telehealth: Payer: Self-pay | Admitting: Family Medicine

## 2022-04-15 NOTE — Telephone Encounter (Signed)
Contacted Maryuri Nelta Numbers to schedule their annual wellness visit. Appointment made for 05/11/2022.  [place] Direct Dial: [phone]

## 2022-05-11 ENCOUNTER — Ambulatory Visit (INDEPENDENT_AMBULATORY_CARE_PROVIDER_SITE_OTHER): Payer: 59

## 2022-05-11 VITALS — BP 128/84 | Ht 62.0 in | Wt 203.0 lb

## 2022-05-11 DIAGNOSIS — Z0000 Encounter for general adult medical examination without abnormal findings: Secondary | ICD-10-CM

## 2022-05-11 NOTE — Progress Notes (Signed)
Subjective:   Miranda Olsen is a 82 y.o. female who presents for Medicare Annual (Subsequent) preventive examination.  Review of Systems    Cardiac Risk Factors include: advanced age (>60men, >40 women);diabetes mellitus;dyslipidemia;hypertension;obesity (BMI >30kg/m2)    Objective:    Today's Vitals   05/11/22 1136  BP: 128/84  Weight: 203 lb (92.1 kg)  Height: 5\' 2"  (1.575 m)   Body mass index is 37.13 kg/m.     05/11/2022   11:52 AM 03/30/2022   10:11 AM 03/22/2022    3:43 PM 04/09/2020    2:15 PM 03/14/2019   10:30 AM 04/01/2018    9:58 AM 03/10/2018    9:37 AM  Advanced Directives  Does Patient Have a Medical Advance Directive? No No No No No No No  Would patient like information on creating a medical advance directive?  No - Patient declined No - Patient declined No - Patient declined No - Patient declined  Yes (MAU/Ambulatory/Procedural Areas - Information given)    Current Medications (verified) Outpatient Encounter Medications as of 05/11/2022  Medication Sig   acetaminophen (TYLENOL) 325 MG tablet Take 650 mg by mouth every 6 (six) hours as needed.   acetaminophen (TYLENOL) 500 MG tablet Take 2 tablets (1,000 mg total) by mouth every 6 (six) hours as needed for mild pain.   amLODipine (NORVASC) 10 MG tablet Take 1 tablet (10 mg total) by mouth daily.   Blood Pressure KIT Use to check blood pressure as directed   cholecalciferol (VITAMIN D) 1000 UNITS tablet Take 1,000 Units by mouth daily. Pt taking 2000 daily   Cinnamon 500 MG capsule Take 500 mg by mouth daily.   Cyanocobalamin (VITAMIN B 12 PO) Take 2,000 Units by mouth daily.   doxazosin (CARDURA) 2 MG tablet TAKE 1 TABLET BY MOUTH EVERY DAY   ferrous sulfate 325 (65 FE) MG tablet TAKE 1 TABLET BY MOUTH EVERY DAY   Garlic 1000 MG CAPS Take by mouth daily.   losartan (COZAAR) 100 MG tablet TAKE 1 TABLET BY MOUTH EVERY DAY   metFORMIN (GLUCOPHAGE) 500 MG tablet TAKE 1 TABLET BY MOUTH EVERY DAY WITH BREAKFAST    metoprolol succinate (TOPROL-XL) 50 MG 24 hr tablet TAKE 1 TABLET BY MOUTH EVERY DAY WITH OR IMMEDIATELY FOLLOWING A MEAL   Omega-3 1000 MG CAPS Take 2 capsules by mouth daily.   omeprazole (PRILOSEC) 40 MG capsule TAKE 1 CAPSULE BY MOUTH EVERY DAY   torsemide (DEMADEX) 10 MG tablet Take 1 tablet (10 mg total) by mouth every morning. (Patient taking differently: Take 10 mg by mouth every other day.)   Evolocumab (REPATHA SURECLICK) 140 MG/ML SOAJ Inject 140 mg into the skin every 14 (fourteen) days. (Patient not taking: Reported on 05/11/2022)   No facility-administered encounter medications on file as of 05/11/2022.    Allergies (verified) Clonidine derivatives and Amoxicillin   History: Past Medical History:  Diagnosis Date   Chronic kidney disease    Diabetes mellitus without complication    GERD (gastroesophageal reflux disease)    Hypercholesterolemia    Hypertension    Syncope    Past Surgical History:  Procedure Laterality Date   COLONOSCOPY WITH PROPOFOL N/A 01/27/2015   Procedure: COLONOSCOPY WITH PROPOFOL;  Surgeon: Elnita Maxwell, MD;  Location: [facility] ENDOSCOPY;  Service: Endoscopy;  Laterality: N/A;   EYE SURGERY Left 01/21/2009    cataract surgery    EYE SURGERY Right 04/08/2009   cataract surgery   INSERTION OF MESH N/A 03/30/2022  Procedure: INSERTION OF MESH;  Surgeon: Henrene Dodge, MD;  Location: [facility] ORS;  Service: General;  Laterality: N/A;   REPLACEMENT TOTAL KNEE Left 10/08/2009   Dr. Ernest Pine   XI ROBOTIC ASSISTED VENTRAL HERNIA N/A 03/30/2022   Procedure: XI ROBOTIC ASSISTED VENTRAL HERNIA & plication of diastatis recti;  Surgeon: Henrene Dodge, MD;  Location: [facility] ORS;  Service: General;  Laterality: N/A;   Family History  Problem Relation Age of Onset   Parkinsonism Mother    Leukemia Father    Cancer Sister        breast   Breast cancer Sister 58   Congestive Heart Failure Sister    Congestive Heart Failure Sister    Lung cancer Brother    Breast  cancer Maternal Aunt 93   Breast cancer Other 40   Social History   Socioeconomic History   Marital status: Widowed    Spouse name: Not on file   Number of children: 2   Years of education: H/S   Highest education level: 11th grade  Occupational History   Occupation: Retired  Tobacco Use   Smoking status: Never    Passive exposure: Never   Smokeless tobacco: Never  Vaping Use   Vaping Use: Never used  Substance and Sexual Activity   Alcohol use: Not Currently    Comment: occasionally 1 glass of wine   Drug use: No   Sexual activity: Not on file  Other Topics Concern   Not on file  Social History Narrative   Lives at home with daughter   Social Determinants of Health   Financial Resource Strain: Low Risk  (05/11/2022)   Overall Financial Resource Strain (CARDIA)    Difficulty of Paying Living Expenses: Not hard at all  Food Insecurity: No Food Insecurity (05/11/2022)   Hunger Vital Sign    Worried About Running Out of Food in the Last Year: Never true    Ran Out of Food in the Last Year: Never true  Transportation Needs: No Transportation Needs (05/11/2022)   PRAPARE - Administrator, Civil Service (Medical): No    Lack of Transportation (Non-Medical): No  Physical Activity: Insufficiently Active (05/11/2022)   Exercise Vital Sign    Days of Exercise per Week: 3 days    Minutes of Exercise per Session: 20 min  Stress: No Stress Concern Present (05/11/2022)   [employer] of Occupational Health - Occupational Stress Questionnaire    Feeling of Stress : Only a little  Social Connections: Moderately Isolated (05/11/2022)   Social Connection and Isolation Panel [NHANES]    Frequency of Communication with Friends and Family: More than three times a week    Frequency of Social Gatherings with Friends and Family: More than three times a week    Attends Religious Services: More than 4 times per year    Active Member of [employer] or Organizations: No    Attends Occupational hygienist Meetings: Never    Marital Status: Widowed    Tobacco Counseling Counseling given: Not Answered   Clinical Intake:  Pre-visit preparation completed: Yes  Pain : No/denies pain   BMI - recorded: 37.13 Nutritional Status: BMI > 30  Obese Nutritional Risks: None Diabetes: Yes CBG done?: No Did pt. bring in CBG monitor from home?: No  How often do you need to have someone help you when you read instructions, pamphlets, or other written materials from your doctor or pharmacy?: 1 - Never  Diabetic?yes  Interpreter Needed?: No  Comments: lives with daughter and grandson Information entered by :: B.Kemet Nijjar,LPN   Activities of Daily Living    05/11/2022   11:56 AM 03/22/2022    3:47 PM  In your present state of health, do you have any difficulty performing the following activities:  Hearing? 1   Vision? 0   Difficulty concentrating or making decisions? 0   Walking or climbing stairs? 1   Dressing or bathing? 0   Doing errands, shopping? 0 0  Preparing Food and eating ? N   Using the Toilet? N   In the past six months, have you accidently leaked urine? N   Do you have problems with loss of bowel control? N   Managing your Medications? N   Managing your Finances? N   Housekeeping or managing your Housekeeping? N     Patient Care Team: Erasmo Downer, MD as PCP - General (Family Medicine) Galen Manila, MD as Referring Physician (Ophthalmology) [name], MD (Nephrology) Gaspar Cola, [facility] (Pharmacist) Antonieta Iba, MD as Consulting Physician (Cardiology)  Indicate any recent Medical Services you may have received from other than [facility] providers in the past year (date may be approximate).     Assessment:   This is a routine wellness examination for Evellyn.  Hearing/Vision screen Hearing Screening - Comments:: Hearing diminishing some Vision Screening - Comments:: Adequate vision w/glasses Normal Eye  Dietary issues  and exercise activities discussed: Current Exercise Habits: The patient does not participate in regular exercise at present   Goals Addressed             This Visit's Progress    Exercise 3x per week (30 min per time)   Not on track    Recommend to exercise for 3 days a week for at least 30 minutes at a time.      Have 3 meals a day   On track    Recommend to decrease portion sizes by eating 3 small healthy meals and at least 2 healthy snacks per day.     Track and Manage My Blood Pressure-Hypertension   On track    Timeframe:  Long-Range Goal Priority:  High Start Date: 10/31/2020                             Expected End Date: 10/31/2021                      Follow Up within 90 days   - check blood pressure 3 times per week - write blood pressure results in a log or diary    Why is this important?   You won't feel high blood pressure, but it can still hurt your blood vessels.  High blood pressure can cause heart or kidney problems. It can also cause a stroke.  Making lifestyle changes like losing a little weight or eating less salt will help.  Checking your blood pressure at home and at different times of the day can help to control blood pressure.  If the doctor prescribes medicine remember to take it the way the doctor ordered.  Call the office if you cannot afford the medicine or if there are questions about it.     Notes:        Depression Screen    05/11/2022   11:44 AM 03/16/2022    9:51 AM 11/17/2021   10:51 AM 04/17/2021    8:41 AM 05/20/2020  1:29 PM 04/09/2020    2:09 PM 03/14/2019   10:27 AM  PHQ 2/9 Scores  PHQ - 2 Score 0 0 0 0 0 1 0  PHQ- 9 Score Fall Risk    05/11/2022   11:41 AM 03/16/2022    9:51 AM 11/17/2021   10:51 AM 04/17/2021    8:40 AM 05/20/2020    1:30 PM  Fall Risk   Falls in the past year? 0 0 0 0 0  Number falls in past yr: 0 0 0 0 0  Injury with Fall? 0 0 0 0 0  Risk for fall due to : No Fall Risks No Fall Risks No  Fall Risks No Fall Risks No Fall Risks  Follow up Education provided;Falls prevention discussed Falls evaluation completed Falls evaluation completed Falls evaluation completed     FALL RISK PREVENTION PERTAINING TO THE HOME:  Any stairs in or around the home? No  If so, are there any without handrails? No  Home free of loose throw rugs in walkways, pet beds, electrical cords, etc? Yes  Adequate lighting in your home to reduce risk of falls? Yes   ASSISTIVE DEVICES UTILIZED TO PREVENT FALLS:  Life alert? No  Use of a cane, walker or w/c? No cane sometimes Grab bars in the bathroom? No  Shower chair or bench in shower? No  Elevated toilet seat or a handicapped toilet? Yes   TIMED UP AND GO:  Was the test performed? Yes .  Length of time to ambulate 10 feet: 10 sec.   Gait steady and fast without use of assistive device  Cognitive Function:        05/11/2022   11:57 AM 04/17/2021    8:43 AM 03/14/2019   10:34 AM 03/10/2018    9:43 AM  6CIT Screen  What Year? 0 points 0 points 0 points 0 points  What month? 0 points 0 points 0 points 0 points  What time? 0 points 0 points 0 points 0 points  Count back from 20 0 points 0 points 0 points 0 points  Months in reverse 0 points 0 points 0 points 0 points  Repeat phrase 0 points 0 points 0 points 0 points  Total Score 0 points 0 points 0 points 0 points    Immunizations Immunization History  Administered Date(s) Administered   COVID-19, mRNA, vaccine(Comirnaty)12 years and older 06/25/2020   Covid-19, Mrna,Vaccine(Spikevax)65yrs and older 03/10/2022   Moderna Covid-19 Vaccine Bivalent Booster 82yrs & up 03/06/2021   Moderna Sars-Covid-2 Vaccination 02/06/2019, 03/06/2019, 11/28/2019   Pneumococcal Conjugate-13 10/03/2013   Pneumococcal Polysaccharide-23 03/25/2011   Zoster Recombinat (Shingrix) 10/21/2021, 01/28/2022    TDAP status: Due, Education has been provided regarding the importance of this vaccine. Advised may  receive this vaccine at local pharmacy or Health Dept. Aware to provide a copy of the vaccination record if obtained from local pharmacy or Health Dept. Verbalized acceptance and understanding.  Flu Vaccine status: Declined, Education has been provided regarding the importance of this vaccine but patient still declined. Advised may receive this vaccine at local pharmacy or Health Dept. Aware to provide a copy of the vaccination record if obtained from local pharmacy or Health Dept. Verbalized acceptance and understanding.  Pneumococcal vaccine status: Up to date  Covid-19 vaccine status: Completed vaccines  Qualifies for Shingles Vaccine? Yes   Zostavax completed Yes   Shingrix Completed?: Yes  Screening Tests Health Maintenance  Topic Date  Due   DTaP/Tdap/Td (1 - Tdap) Never done   FOOT EXAM  11/20/2021   Diabetic kidney evaluation - Urine ACR  04/18/2022   COVID-19 Vaccine (7 - 2023-24 season) 05/05/2022   INFLUENZA VACCINE  08/26/2022   HEMOGLOBIN A1C  09/14/2022   OPHTHALMOLOGY EXAM  09/22/2022   Diabetic kidney evaluation - eGFR measurement  03/17/2023   Medicare Annual Wellness (AWV)  05/11/2023   MAMMOGRAM  06/19/2023   Pneumonia Vaccine 67+ Years old  Completed   DEXA SCAN  Completed   Zoster Vaccines- Shingrix  Completed   HPV VACCINES  Aged Out    Health Maintenance  Health Maintenance Due  Topic Date Due   DTaP/Tdap/Td (1 - Tdap) Never done   FOOT EXAM  11/20/2021   Diabetic kidney evaluation - Urine ACR  04/18/2022   COVID-19 Vaccine (7 - 2023-24 season) 05/05/2022    Colorectal cancer screening: No longer required.   Mammogram status: No longer required due to age.  Bone Density status: Completed yes. Results reflect: Bone density results: NORMAL. Repeat every 5 years.  Lung Cancer Screening: (Low Dose CT Chest recommended if Age 96-80 years, 30 pack-year currently smoking OR have quit w/in 15years.) does not qualify.   Lung Cancer Screening Referral:  no  Additional Screening:  Hepatitis C Screening: does not qualify; Completed yes  Vision Screening: Recommended annual ophthalmology exams for early detection of glaucoma and other disorders of the eye. Is the patient up to date with their annual eye exam?  Yes  Who is the provider or what is the name of the office in which the patient attends annual eye exams? [facility] If pt is not established with a provider, would they like to be referred to a provider to establish care? No .   Dental Screening: Recommended annual dental exams for proper oral hygiene  Community Resource Referral / Chronic Care Management: CRR required this visit?  No   CCM required this visit?  No    Plan:     I have personally reviewed and noted the following in the patient's chart:   Medical and social history Use of alcohol, tobacco or illicit drugs  Current medications and supplements including opioid prescriptions. Patient is not currently taking opioid prescriptions. Functional ability and status Nutritional status Physical activity Advanced directives List of other physicians Hospitalizations, surgeries, and ER visits in previous 12 months Vitals Screenings to include cognitive, depression, and falls Referrals and appointments  In addition, I have reviewed and discussed with patient certain preventive protocols, quality metrics, and best practice recommendations. A written personalized care plan for preventive services as well as general preventive health recommendations were provided to patient.    Sue Lush, LPN   1/61/0960   Nurse Notes: The patient states she is doing well and has no concerns or questions at this time.

## 2022-05-11 NOTE — Patient Instructions (Signed)
Miranda Olsen , Thank you for taking time to come for your Medicare Wellness Visit. I appreciate your ongoing commitment to your health goals. Please review the following plan we discussed and let me know if I can assist you in the future.   These are the goals we discussed:  Goals      Exercise 3x per week (30 min per time)     Recommend to exercise for 3 days a week for at least 30 minutes at a time.      Have 3 meals a day     Recommend to decrease portion sizes by eating 3 small healthy meals and at least 2 healthy snacks per day.     Track and Manage My Blood Pressure-Hypertension     Timeframe:  Long-Range Goal Priority:  High Start Date: 10/31/2020                             Expected End Date: 10/31/2021                      Follow Up within 90 days   - check blood pressure 3 times per week - write blood pressure results in a log or diary    Why is this important?   You won't feel high blood pressure, but it can still hurt your blood vessels.  High blood pressure can cause heart or kidney problems. It can also cause a stroke.  Making lifestyle changes like losing a little weight or eating less salt will help.  Checking your blood pressure at home and at different times of the day can help to control blood pressure.  If the doctor prescribes medicine remember to take it the way the doctor ordered.  Call the office if you cannot afford the medicine or if there are questions about it.     Notes:         This is a list of the screening recommended for you and due dates:  Health Maintenance  Topic Date Due   DTaP/Tdap/Td vaccine (1 - Tdap) Never done   Complete foot exam   11/20/2021   Yearly kidney health urinalysis for diabetes  04/18/2022   COVID-19 Vaccine (7 - 2023-24 season) 05/05/2022   Flu Shot  08/26/2022   Hemoglobin A1C  09/14/2022   Eye exam for diabetics  09/22/2022   Yearly kidney function blood test for diabetes  03/17/2023   Medicare Annual Wellness  Visit  05/11/2023   Mammogram  06/19/2023   Pneumonia Vaccine  Completed   DEXA scan (bone density measurement)  Completed   Zoster (Shingles) Vaccine  Completed   HPV Vaccine  Aged Out    Advanced directives: no  Conditions/risks identified: low falls risk  Next appointment: Follow up in one year for your annual wellness visit 05/16/2023 @ 10:45am in person   Preventive Care 65 Years and Older, Female Preventive care refers to lifestyle choices and visits with your health care provider that can promote health and wellness. What does preventive care include? A yearly physical exam. This is also called an annual well check. Dental exams once or twice a year. Routine eye exams. Ask your health care provider how often you should have your eyes checked. Personal lifestyle choices, including: Daily care of your teeth and gums. Regular physical activity. Eating a healthy diet. Avoiding tobacco and drug use. Limiting alcohol use. Practicing safe sex. Taking low-dose aspirin  every day. Taking vitamin and mineral supplements as recommended by your health care provider. What happens during an annual well check? The services and screenings done by your health care provider during your annual well check will depend on your age, overall health, lifestyle risk factors, and family history of disease. Counseling  Your health care provider may ask you questions about your: Alcohol use. Tobacco use. Drug use. Emotional well-being. Home and relationship well-being. Sexual activity. Eating habits. History of falls. Memory and ability to understand (cognition). Work and work Astronomer. Reproductive health. Screening  You may have the following tests or measurements: Height, weight, and BMI. Blood pressure. Lipid and cholesterol levels. These may be checked every 5 years, or more frequently if you are over 49 years old. Skin check. Lung cancer screening. You may have this screening  every year starting at age 16 if you have a 30-pack-year history of smoking and currently smoke or have quit within the past 15 years. Fecal occult blood test (FOBT) of the stool. You may have this test every year starting at age 7. Flexible sigmoidoscopy or colonoscopy. You may have a sigmoidoscopy every 5 years or a colonoscopy every 10 years starting at age 34. Hepatitis C blood test. Hepatitis B blood test. Sexually transmitted disease (STD) testing. Diabetes screening. This is done by checking your blood sugar (glucose) after you have not eaten for a while (fasting). You may have this done every 1-3 years. Bone density scan. This is done to screen for osteoporosis. You may have this done starting at age 69. Mammogram. This may be done every 1-2 years. Talk to your health care provider about how often you should have regular mammograms. Talk with your health care provider about your test results, treatment options, and if necessary, the need for more tests. Vaccines  Your health care provider may recommend certain vaccines, such as: Influenza vaccine. This is recommended every year. Tetanus, diphtheria, and acellular pertussis (Tdap, Td) vaccine. You may need a Td booster every 10 years. Zoster vaccine. You may need this after age 98. Pneumococcal 13-valent conjugate (PCV13) vaccine. One dose is recommended after age 31. Pneumococcal polysaccharide (PPSV23) vaccine. One dose is recommended after age 53. Talk to your health care provider about which screenings and vaccines you need and how often you need them. This information is not intended to replace advice given to you by your health care provider. Make sure you discuss any questions you have with your health care provider. Document Released: 02/07/2015 Document Revised: 10/01/2015 Document Reviewed: 11/12/2014 Elsevier Interactive Patient Education  2017 [employer].  Fall Prevention in the Home Falls can cause injuries. They can  happen to people of all ages. There are many things you can do to make your home safe and to help prevent falls. What can I do on the outside of my home? Regularly fix the edges of walkways and driveways and fix any cracks. Remove anything that might make you trip as you walk through a door, such as a raised step or threshold. Trim any bushes or trees on the path to your home. Use bright outdoor lighting. Clear any walking paths of anything that might make someone trip, such as rocks or tools. Regularly check to see if handrails are loose or broken. Make sure that both sides of any steps have handrails. Any raised decks and porches should have guardrails on the edges. Have any leaves, snow, or ice cleared regularly. Use sand or salt on walking paths during winter.  Clean up any spills in your garage right away. This includes oil or grease spills. What can I do in the bathroom? Use night lights. Install grab bars by the toilet and in the tub and shower. Do not use towel bars as grab bars. Use non-skid mats or decals in the tub or shower. If you need to sit down in the shower, use a plastic, non-slip stool. Keep the floor dry. Clean up any water that spills on the floor as soon as it happens. Remove soap buildup in the tub or shower regularly. Attach bath mats securely with double-sided non-slip rug tape. Do not have throw rugs and other things on the floor that can make you trip. What can I do in the bedroom? Use night lights. Make sure that you have a light by your bed that is easy to reach. Do not use any sheets or blankets that are too big for your bed. They should not hang down onto the floor. Have a firm chair that has side arms. You can use this for support while you get dressed. Do not have throw rugs and other things on the floor that can make you trip. What can I do in the kitchen? Clean up any spills right away. Avoid walking on wet floors. Keep items that you use a lot in  easy-to-reach places. If you need to reach something above you, use a strong step stool that has a grab bar. Keep electrical cords out of the way. Do not use floor polish or wax that makes floors slippery. If you must use wax, use non-skid floor wax. Do not have throw rugs and other things on the floor that can make you trip. What can I do with my stairs? Do not leave any items on the stairs. Make sure that there are handrails on both sides of the stairs and use them. Fix handrails that are broken or loose. Make sure that handrails are as long as the stairways. Check any carpeting to make sure that it is firmly attached to the stairs. Fix any carpet that is loose or worn. Avoid having throw rugs at the top or bottom of the stairs. If you do have throw rugs, attach them to the floor with carpet tape. Make sure that you have a light switch at the top of the stairs and the bottom of the stairs. If you do not have them, ask someone to add them for you. What else can I do to help prevent falls? Wear shoes that: Do not have high heels. Have rubber bottoms. Are comfortable and fit you well. Are closed at the toe. Do not wear sandals. If you use a stepladder: Make sure that it is fully opened. Do not climb a closed stepladder. Make sure that both sides of the stepladder are locked into place. Ask someone to hold it for you, if possible. Clearly mark and make sure that you can see: Any grab bars or handrails. First and last steps. Where the edge of each step is. Use tools that help you move around (mobility aids) if they are needed. These include: Canes. Walkers. Scooters. Crutches. Turn on the lights when you go into a dark area. Replace any light bulbs as soon as they burn out. Set up your furniture so you have a clear path. Avoid moving your furniture around. If any of your floors are uneven, fix them. If there are any pets around you, be aware of where they are. Review your medicines  with your doctor. Some medicines can make you feel dizzy. This can increase your chance of falling. Ask your doctor what other things that you can do to help prevent falls. This information is not intended to replace advice given to you by your health care provider. Make sure you discuss any questions you have with your health care provider. Document Released: 11/07/2008 Document Revised: 06/19/2015 Document Reviewed: 02/15/2014 Elsevier Interactive Patient Education  2017 [employer].

## 2022-06-10 ENCOUNTER — Telehealth: Payer: Self-pay | Admitting: Cardiovascular Disease

## 2022-06-10 NOTE — Telephone Encounter (Signed)
I called and spoke with Konrad Felix, NP with [employer].  She advised she was seeing the patient as an annual visit today per her insurance and noted that the patient did have an irregular heart beat.   She noticed this first when checking the patient's blood pressure. Manual pulse was hard to count due to irregularity.   BP- 130/66 & HR- 78 "as best as I could tell" - per Konrad Felix, NP.  She advised the patient was asymptomatic and unaware of ever being told she had an irregular heart beat.   The patient has previously been seen in our office for hypertension. She has not documented atrial fibrillation.  I advised Konrad Felix that we will review with Dr. Mariah Milling to see if he: 1) would prefer the patient come in for an office visit 2) or would he like [place] to mail her a heart monitor, then follow up.  Konrad Felix is aware we will follow up with the patient with MD recommendations once received.   She was appreciative of the follow up call.

## 2022-06-10 NOTE — Telephone Encounter (Signed)
Home Health Nurse calling to discuss irregular heart rhythm and requesting call back to discuss.

## 2022-06-14 ENCOUNTER — Other Ambulatory Visit: Payer: Self-pay

## 2022-06-14 ENCOUNTER — Ambulatory Visit: Payer: 59 | Attending: Cardiovascular Disease

## 2022-06-14 DIAGNOSIS — I499 Cardiac arrhythmia, unspecified: Secondary | ICD-10-CM

## 2022-06-14 NOTE — Telephone Encounter (Signed)
If patient is agreeable, would send a Zio monitor 2 weeks for further evaluation Thx TGollan  ZIO heart monitor ordered and sent to patient. Patient will need to be walked through applying monitor. Patient may even need to come into clinical to have help to apply monitor as she stated she wouldn't know how.

## 2022-06-16 ENCOUNTER — Telehealth: Payer: Self-pay | Admitting: Cardiovascular Disease

## 2022-06-16 ENCOUNTER — Other Ambulatory Visit: Payer: Self-pay | Admitting: Family Medicine

## 2022-06-16 DIAGNOSIS — K219 Gastro-esophageal reflux disease without esophagitis: Secondary | ICD-10-CM

## 2022-06-16 NOTE — Telephone Encounter (Signed)
Patient called stating she received a zio monitor.  She does not know how to put it on, she would like to come into the office so someone can help her put in on.

## 2022-06-16 NOTE — Telephone Encounter (Signed)
Requested Prescriptions  Pending Prescriptions Disp Refills   omeprazole (PRILOSEC) 40 MG capsule [Pharmacy Med Name: OMEPRAZOLE DR 40 MG CAPSULE] 90 capsule 0    Sig: TAKE 1 CAPSULE BY MOUTH EVERY DAY     Gastroenterology: Proton Pump Inhibitors Passed - 06/16/2022  9:20 AM      Passed - Valid encounter within last 12 months    Recent Outpatient Visits           3 months ago Pre-op evaluation   [facility], Marzella Schlein, MD   4 months ago Diastasis recti   [facility], Marzella Schlein, MD   7 months ago Type 2 diabetes mellitus with stage 3b chronic kidney disease, without long-term current use of insulin [facility])   [place] [facility] Caro Laroche, DO   1 year ago Encounter for [employer] annual wellness exam   [facility], Marzella Schlein, MD   1 year ago Hypertension associated with diabetes [facility])   [facility], Marzella Schlein, MD

## 2022-06-17 DIAGNOSIS — I471 Supraventricular tachycardia, unspecified: Secondary | ICD-10-CM

## 2022-06-17 DIAGNOSIS — I499 Cardiac arrhythmia, unspecified: Secondary | ICD-10-CM

## 2022-06-17 NOTE — Telephone Encounter (Signed)
Patient came in for zio placement. We reviewed all information necessary and discussed how to return monitor in box provided. She verbalized understanding and had no further questions at this time.

## 2022-06-17 NOTE — Telephone Encounter (Signed)
Pt is here in lobby for help with Zio monitor.

## 2022-06-24 ENCOUNTER — Other Ambulatory Visit: Payer: Self-pay | Admitting: Family Medicine

## 2022-06-24 ENCOUNTER — Telehealth: Payer: Self-pay | Admitting: Cardiovascular Disease

## 2022-06-24 DIAGNOSIS — Z1231 Encounter for screening mammogram for malignant neoplasm of breast: Secondary | ICD-10-CM

## 2022-06-24 NOTE — Telephone Encounter (Signed)
The patient stated that her monitor fell off after one day. She has mailed it back. She was advised that we will call her with the results and next steps after it has been received.

## 2022-06-24 NOTE — Telephone Encounter (Signed)
Pt stated she was wearing a monitor but it wouldn't stay on, she even tried to tape it on but it wouldn't stay so she called customer service and they advised her to take it off and send it back. Pt stated she was supposed to have it on until June 6th and she just wanted to make the office aware of what's going on and she'd like a callback. Please advise

## 2022-06-29 DIAGNOSIS — I498 Other specified cardiac arrhythmias: Secondary | ICD-10-CM | POA: Diagnosis not present

## 2022-07-05 ENCOUNTER — Telehealth: Payer: Self-pay

## 2022-07-05 NOTE — Telephone Encounter (Signed)
Called pt regarding monitor results. Pt stated she's been experiencing off and on chest pressure for a couple months. Pt stated she notice symptoms mostly at night while watching tv. Pt unable to determine precipitating or alleviating factors. Pt denies any other symptoms during that time.  Pt denies symptoms currently.  Appointment scheduled for tomorrow 6/11 with Dr. Mariah Milling for further evaluations.  Pt made aware of ED precaution should any new symptoms develop or worsen.

## 2022-07-05 NOTE — Progress Notes (Unsigned)
Cardiology Office Note  Date:  07/06/2022   ID:  Miranda Olsen, DOB 1940-04-02, MRN [id number]  PCP:  Erasmo Downer, MD   Chief Complaint  Patient presents with   Chest Pain    Patient c/o chest pressure/indigestion and shortness of breath with walking. Medications reviewed by the patient verbally.     HPI:  Miranda Olsen is a 82 year old woman with past medical history of Hypertension Hyperlipidemia Chronic kidney disease , GFR greater than 50 Diabetes Scoliosis Who presents for hypertension management, remote episode of syncope June 2022, hyperlipidemia, chronic renal insufficiency  Last seen by myself in clinic November 2023  Zio monitor: NSR, rare PACs Fell off after 6 days  Some chest discomfort, atypical, presenting at rest in the evenings Reports she is on PPI Reports that she got anxious after visiting nurse appreciated irregular pulse Was told chest pain might be cardiac related  Able to ambulate without chest pain concerning for angina Mild shortness of breath on exertion  Limited mobility secondary to chronic back pain Sleeps in recliner with her feet up Minimal leg edema  Statin myalgias  did not tolerate Crestor or Zetia  Labs reviewed: CR 1.67 A1C 6.6 Cholesterol 300  EKG personally reviewed by myself on todays visit Normal sinus rhythm rate 75 bpm no significant ST-T wave changes  Other past medical history reviewed 1 episode of syncope 06/2020 Was taking care of her granddaughter, granddaughter was up all night she did not sleep very well Miranda Olsen was exhausted that morning, did not get anything to eat or drink, took her morning medications Reports that church was hot, was back with the children, sitting Felt very tired, started developing discomfort in her shoulders all over  Next thing she knows, she she was out for several seconds and does not remember what happens EMTs come out, blood pressure EKG normal did not want to  go to the hospital. blood pressure being 126/93. In retrospect she does report taking medications on an empty stomach " Never got to drink my coffee"  Since then no further episodes, denies any orthostasis symptoms Blood pressure typically 130  Stressors, son passed away 2019-11-24 CT scan abdomen pelvis March 2020  Small to moderate sized hiatal hernia, umbilical hernia, Tortuous aorta Minimal aortic atherosclerosis  Family history Mother with high blood pressure   PMH:   has a past medical history of Chronic kidney disease, Diabetes mellitus without complication (HCC), GERD (gastroesophageal reflux disease), Hypercholesterolemia, Hypertension, and Syncope.  PSH:    Past Surgical History:  Procedure Laterality Date   COLONOSCOPY WITH PROPOFOL N/A 01/27/2015   Procedure: COLONOSCOPY WITH PROPOFOL;  Surgeon: Miranda Maxwell, MD;  Location: [facility];  Service: Endoscopy;  Laterality: N/A;   EYE SURGERY Left 01/21/2009    cataract surgery    EYE SURGERY Right 04/08/2009   cataract surgery   INSERTION OF MESH N/A 03/30/2022   Procedure: INSERTION OF MESH;  Surgeon: Miranda Dodge, MD;  Location: [facility] ORS;  Service: General;  Laterality: N/A;   REPLACEMENT TOTAL KNEE Left 10/08/2009   Dr. Ernest Olsen   XI ROBOTIC ASSISTED VENTRAL HERNIA N/A 03/30/2022   Procedure: XI ROBOTIC ASSISTED VENTRAL HERNIA & plication of diastatis recti;  Surgeon: Miranda Dodge, MD;  Location: [facility] ORS;  Service: General;  Laterality: N/A;    Current Outpatient Medications  Medication Sig Dispense Refill   acetaminophen (TYLENOL) 325 MG tablet Take 650 mg by mouth every 6 (six) hours as needed.  amLODipine (NORVASC) 10 MG tablet Take 1 tablet (10 mg total) by mouth daily. 90 tablet 1   cholecalciferol (VITAMIN D) 1000 UNITS tablet Take 1,000 Units by mouth daily. Pt taking 2000 daily     Cinnamon 500 MG capsule Take 500 mg by mouth daily.     Cyanocobalamin (VITAMIN B 12 PO) Take 2,000 Units by mouth  daily.     doxazosin (CARDURA) 2 MG tablet TAKE 1 TABLET BY MOUTH EVERY DAY 90 tablet 2   ferrous sulfate 325 (65 FE) MG tablet TAKE 1 TABLET BY MOUTH EVERY DAY 90 tablet 0   Garlic 1000 MG CAPS Take by mouth daily.     losartan (COZAAR) 100 MG tablet TAKE 1 TABLET BY MOUTH EVERY DAY 90 tablet 1   metFORMIN (GLUCOPHAGE) 500 MG tablet TAKE 1 TABLET BY MOUTH EVERY DAY WITH BREAKFAST 90 tablet 1   metoprolol succinate (TOPROL-XL) 50 MG 24 hr tablet TAKE 1 TABLET BY MOUTH EVERY DAY WITH OR IMMEDIATELY FOLLOWING A MEAL 90 tablet 1   Omega-3 1000 MG CAPS Take 2 capsules by mouth daily.     omeprazole (PRILOSEC) 40 MG capsule TAKE 1 CAPSULE BY MOUTH EVERY DAY 90 capsule 0   torsemide (DEMADEX) 10 MG tablet Take 10 mg by mouth every other day.     acetaminophen (TYLENOL) 500 MG tablet Take 2 tablets (1,000 mg total) by mouth every 6 (six) hours as needed for mild pain. (Patient not taking: Reported on 07/06/2022)     Blood Pressure KIT Use to check blood pressure as directed (Patient not taking: Reported on 07/06/2022) 1 kit 0   No current facility-administered medications for this visit.    Allergies:   Clonidine derivatives and Amoxicillin   Social History:  The patient  reports that she has never smoked. She has never been exposed to tobacco smoke. She has never used smokeless tobacco. She reports that she does not currently use alcohol. She reports that she does not use drugs.   Family History:   family history includes Breast cancer (age of onset: 48) in an other family member; Breast cancer (age of onset: 54) in her sister; Breast cancer (age of onset: 12) in her maternal aunt; Cancer in her sister; Congestive Heart Failure in her sister and sister; Leukemia in her father; Lung cancer in her brother; Parkinsonism in her mother.    Review of Systems: Review of Systems  Constitutional: Negative.   HENT: Negative.    Respiratory: Negative.    Cardiovascular: Negative.   Gastrointestinal:  Negative.   Musculoskeletal:  Positive for back pain.  Neurological: Negative.   Psychiatric/Behavioral: Negative.    All other systems reviewed and are negative.   PHYSICAL EXAM: VS:  BP 130/70 (BP Location: Left Arm, Patient Position: Sitting, Cuff Size: Normal)   Pulse 75   Ht 5' (1.524 m)   Wt 200 lb (90.7 kg)   SpO2 97%   BMI 39.06 kg/m  , BMI Body mass index is 39.06 kg/m. Constitutional:  oriented to person, place, and time. No distress.  HENT:  Head: Grossly normal Eyes:  no discharge. No scleral icterus.  Neck: No JVD, no carotid bruits  Cardiovascular: Regular rate and rhythm, no murmurs appreciated Pulmonary/Chest: Clear to auscultation bilaterally, no wheezes or rails Abdominal: Soft.  no distension.  no tenderness.  Musculoskeletal: Normal range of motion Neurological:  normal muscle tone. Coordination normal. No atrophy Skin: Skin warm and dry Psychiatric: normal affect, pleasant   Recent Labs: 03/16/2022:  BUN 27; Creatinine, Ser 1.67; Hemoglobin 10.8; Platelets 255; Potassium 4.8; Sodium 139    Lipid Panel Lab Results  Component Value Date   CHOL 301 (H) 04/17/2021   HDL 74 04/17/2021   LDLCALC 215 (H) 04/17/2021   TRIG 76 04/17/2021      Wt Readings from Last 3 Encounters:  07/06/22 200 lb (90.7 kg)  05/11/22 203 lb (92.1 kg)  04/14/22 202 lb (91.6 kg)     ASSESSMENT AND PLAN:  Problem List Items Addressed This Visit       Cardiology Problems   Benign hypertension with CKD (chronic kidney disease) stage III (HCC)   Relevant Medications   torsemide (DEMADEX) 10 MG tablet     Other   Localized edema   CKD stage 3 due to type 2 diabetes mellitus (HCC)   Other Visit Diagnoses     Diabetes mellitus without complication (HCC)    -  Primary   Hypercholesteremia       Relevant Medications   torsemide (DEMADEX) 10 MG tablet     Essential hypertension Blood pressure is well controlled on today's visit. No changes made to the  medications.  Hyperlipidemia Myalgias on Crestor, cholesterol 300  aortic atherosclerosis on CT scan off zetia, leg pain, stopped it Given cholesterol was 300, reports she does not want Repatha  Obesity Calorie restriction recommended, unable to exercise in the setting of chronic back pain  Diabetes type 2 A1c steady 6.3 Chronic renal sufficiency, creatinine stable  Chest discomfort Atypical in nature, presenting at rest in the evenings, resolves without intervention, mild symptoms Recommend she try Mylanta for GERD symptoms when she has discomfort If no improvement in symptoms, could consider stress test Will avoid cardiac CTA in the setting of chronic renal insufficiency   Total encounter time more than 30 minutes  Greater than 50% was spent in counseling and coordination of care with the patient    Signed, Dossie Arbour, M.D., Ph.D. [facility] [place], [place] [phone]

## 2022-07-06 ENCOUNTER — Encounter: Payer: Self-pay | Admitting: Cardiovascular Disease

## 2022-07-06 ENCOUNTER — Ambulatory Visit: Payer: 59 | Attending: Cardiovascular Disease | Admitting: Cardiovascular Disease

## 2022-07-06 VITALS — BP 130/70 | HR 75 | Ht 60.0 in | Wt 200.0 lb

## 2022-07-06 DIAGNOSIS — Z7984 Long term (current) use of oral hypoglycemic drugs: Secondary | ICD-10-CM | POA: Diagnosis not present

## 2022-07-06 DIAGNOSIS — I129 Hypertensive chronic kidney disease with stage 1 through stage 4 chronic kidney disease, or unspecified chronic kidney disease: Secondary | ICD-10-CM

## 2022-07-06 DIAGNOSIS — E1122 Type 2 diabetes mellitus with diabetic chronic kidney disease: Secondary | ICD-10-CM | POA: Diagnosis not present

## 2022-07-06 DIAGNOSIS — E119 Type 2 diabetes mellitus without complications: Secondary | ICD-10-CM

## 2022-07-06 DIAGNOSIS — E7800 Pure hypercholesterolemia, unspecified: Secondary | ICD-10-CM

## 2022-07-06 DIAGNOSIS — N183 Chronic kidney disease, stage 3 unspecified: Secondary | ICD-10-CM

## 2022-07-06 DIAGNOSIS — R600 Localized edema: Secondary | ICD-10-CM

## 2022-07-06 NOTE — Patient Instructions (Signed)
Medication Instructions:  Try mylanta for chest pain If no relief, call the office, we might need a nuclear stress test  If you need a refill on your cardiac medications before your next appointment, please call your pharmacy.   Lab work: No new labs needed  Testing/Procedures: No new testing needed  Follow-Up: At [facility], you and your health needs are our priority.  As part of our continuing mission to provide you with exceptional heart care, we have created designated Provider Care Teams.  These Care Teams include your primary Cardiologist (physician) and Advanced Practice Providers (APPs -  Physician Assistants and Nurse Practitioners) who all work together to provide you with the care you need, when you need it.  You will need a follow up appointment in 12 months  Providers on your designated Care Team:   Nicolasa Ducking, NP Eula Listen, PA-C Cadence Fransico Michael, [place]  COVID-19 Vaccine Information can be found at: [email or web address] For questions related to vaccine distribution or appointments, please email [email or web address] or call [phone].

## 2022-07-07 ENCOUNTER — Ambulatory Visit
Admission: RE | Admit: 2022-07-07 | Discharge: 2022-07-07 | Disposition: A | Discharge status: Home / Self Care | Payer: 59 | Source: Ambulatory Visit | Attending: Family Medicine | Admitting: Family Medicine

## 2022-07-07 DIAGNOSIS — Z1231 Encounter for screening mammogram for malignant neoplasm of breast: Secondary | ICD-10-CM | POA: Insufficient documentation

## 2022-07-21 ENCOUNTER — Other Ambulatory Visit: Payer: Self-pay | Admitting: Family Medicine

## 2022-07-21 DIAGNOSIS — K219 Gastro-esophageal reflux disease without esophagitis: Secondary | ICD-10-CM

## 2022-07-21 DIAGNOSIS — I10 Essential (primary) hypertension: Secondary | ICD-10-CM

## 2022-07-21 DIAGNOSIS — E1122 Type 2 diabetes mellitus with diabetic chronic kidney disease: Secondary | ICD-10-CM

## 2022-08-16 ENCOUNTER — Other Ambulatory Visit: Payer: Self-pay | Admitting: Family Medicine

## 2022-08-16 DIAGNOSIS — E1122 Type 2 diabetes mellitus with diabetic chronic kidney disease: Secondary | ICD-10-CM

## 2022-08-16 DIAGNOSIS — I10 Essential (primary) hypertension: Secondary | ICD-10-CM

## 2022-08-16 DIAGNOSIS — K219 Gastro-esophageal reflux disease without esophagitis: Secondary | ICD-10-CM

## 2022-08-17 NOTE — Telephone Encounter (Signed)
Requested Prescriptions  Pending Prescriptions Disp Refills   amLODipine (NORVASC) 10 MG tablet [Pharmacy Med Name: AMLODIPINE BESYLATE 10 MG TAB] 90 tablet 0    Sig: TAKE 1 TABLET BY MOUTH EVERY DAY     Cardiovascular: Calcium Channel Blockers 2 Passed - 08/16/2022 12:50 PM      Passed - Last BP in normal range    BP Readings from Last 1 Encounters:  07/06/22 130/70         Passed - Last Heart Rate in normal range    Pulse Readings from Last 1 Encounters:  07/06/22 75         Passed - Valid encounter within last 6 months    Recent Outpatient Visits           5 months ago Pre-op evaluation   [place] [facility] [place], Marzella Schlein, MD   6 months ago Diastasis recti   [facility] [place], Marzella Schlein, MD   9 months ago Type 2 diabetes mellitus with stage 3b chronic kidney disease, without long-term current use of insulin [facility])   [place] [facility] Caro Laroche, DO   1 year ago Encounter for [employer] annual wellness exam   [place] [facility] [place], Marzella Schlein, MD   1 year ago Hypertension associated with diabetes [facility])   [facility], Marzella Schlein, MD

## 2022-08-17 NOTE — Telephone Encounter (Signed)
Requested Prescriptions  Pending Prescriptions Disp Refills   doxazosin (CARDURA) 2 MG tablet [Pharmacy Med Name: DOXAZOSIN MESYLATE 2 MG TAB] 90 tablet 1    Sig: TAKE 1 TABLET BY MOUTH EVERY DAY     Cardiovascular:  Alpha Blockers Passed - 08/16/2022 12:50 PM      Passed - Last BP in normal range    BP Readings from Last 1 Encounters:  07/06/22 130/70         Passed - Valid encounter within last 6 months    Recent Outpatient Visits           5 months ago Pre-op evaluation   [facility] [place], Marzella Schlein, MD   6 months ago Diastasis recti   [place] [facility] [place], Marzella Schlein, MD   9 months ago Type 2 diabetes mellitus with stage 3b chronic kidney disease, without long-term current use of insulin (HCC)   [place] [place], Darl Householder, DO   1 year ago Encounter for [employer] annual wellness exam   [place] [facility] [place], Marzella Schlein, MD   1 year ago Hypertension associated with diabetes [facility])   [place] [facility] [place], Marzella Schlein, MD               metFORMIN (GLUCOPHAGE) 500 MG tablet [Pharmacy Med Name: METFORMIN HCL 500 MG TABLET] 90 tablet 1    Sig: TAKE 1 TABLET BY MOUTH EVERY DAY WITH BREAKFAST     Endocrinology:  Diabetes - Biguanides Failed - 08/16/2022 12:50 PM      Failed - Cr in normal range and within 360 days    Creatinine, Ser  Date Value Ref Range Status  03/16/2022 1.67 (H) 0.57 - 1.00 mg/dL Final         Failed - eGFR in normal range and within 360 days    GFR calc Af Amer  Date Value Ref Range Status  07/16/2020 43  Final   GFR, Estimated  Date Value Ref Range Status  02/18/2022 28 (L) >60 mL/min Final    Comment:    (NOTE) Calculated using the CKD-EPI Creatinine Equation (2021) Performed at [facility], [address] [place], [place] [address]    eGFR  Date Value Ref Range Status  03/16/2022 31 (L) >59  mL/min/1.73 Final         Failed - B12 Level in normal range and within 720 days    Vitamin B-12  Date Value Ref Range Status  04/17/2021 200 (L) 232 - 1,245 pg/mL Final         Failed - CBC within normal limits and completed in the last 12 months    WBC  Date Value Ref Range Status  03/16/2022 5.4 3.4 - 10.8 x10E3/uL Final  04/01/2018 8.9 4.0 - 10.5 K/uL Final   RBC  Date Value Ref Range Status  03/16/2022 3.77 3.77 - 5.28 x10E6/uL Final  07/16/2020 3.63 (A) 3.87 - 5.11 Final   Hemoglobin  Date Value Ref Range Status  03/16/2022 10.8 (L) 11.1 - 15.9 g/dL Final   Hematocrit  Date Value Ref Range Status  03/16/2022 33.3 (L) 34.0 - 46.6 % Final   MCHC  Date Value Ref Range Status  03/16/2022 32.4 31.5 - 35.7 g/dL Final  60/45/4098 11.9 30.0 - 36.0 g/dL Final   [place]  Date Value Ref Range Status  03/16/2022 28.6 26.6 - 33.0 pg Final  04/01/2018 28.6 26.0 - 34.0  pg Final   MCV  Date Value Ref Range Status  03/16/2022 88 79 - 97 fL Final   No results found for: "PLTCOUNTKUC", "LABPLAT", "POCPLA" RDW  Date Value Ref Range Status  03/16/2022 12.6 11.7 - 15.4 % Final         Passed - HBA1C is between 0 and 7.9 and within 180 days    Hgb A1c MFr Bld  Date Value Ref Range Status  03/16/2022 6.6 (H) 4.8 - 5.6 % Final    Comment:             Prediabetes: 5.7 - 6.4          Diabetes: >6.4          Glycemic control for adults with diabetes: <7.0          Passed - Valid encounter within last 6 months    Recent Outpatient Visits           5 months ago Pre-op evaluation   [facility] [place], Marzella Schlein, MD   6 months ago Diastasis recti   [facility] [place], Marzella Schlein, MD   9 months ago Type 2 diabetes mellitus with stage 3b chronic kidney disease, without long-term current use of insulin (HCC)   [place] [facility] [place], Darl Householder, DO   1 year ago Encounter for [employer] annual wellness  exam   [place] [facility] [place], Marzella Schlein, MD   1 year ago Hypertension associated with diabetes [facility])   [place] [facility] [place], Marzella Schlein, MD               omeprazole (PRILOSEC) 40 MG capsule [Pharmacy Med Name: OMEPRAZOLE DR 40 MG CAPSULE] 90 capsule 1    Sig: TAKE 1 CAPSULE BY MOUTH EVERY DAY     Gastroenterology: Proton Pump Inhibitors Passed - 08/16/2022 12:50 PM      Passed - Valid encounter within last 12 months    Recent Outpatient Visits           5 months ago Pre-op evaluation   [facility] [place], Marzella Schlein, MD   6 months ago Diastasis recti   [facility], Marzella Schlein, MD   9 months ago Type 2 diabetes mellitus with stage 3b chronic kidney disease, without long-term current use of insulin [facility])   [place] [facility] Caro Laroche, DO   1 year ago Encounter for [employer] annual wellness exam   [place] [facility] [place], Marzella Schlein, MD   1 year ago Hypertension associated with diabetes [facility])   [place] [facility] [place], Marzella Schlein, MD

## 2022-10-18 ENCOUNTER — Other Ambulatory Visit: Payer: Self-pay | Admitting: Cardiovascular Disease

## 2022-10-18 ENCOUNTER — Other Ambulatory Visit: Payer: Self-pay | Admitting: Family Medicine

## 2022-10-18 DIAGNOSIS — I10 Essential (primary) hypertension: Secondary | ICD-10-CM

## 2022-10-18 DIAGNOSIS — N183 Chronic kidney disease, stage 3 unspecified: Secondary | ICD-10-CM

## 2022-10-20 ENCOUNTER — Telehealth: Payer: Self-pay

## 2022-10-20 NOTE — Telephone Encounter (Signed)
Copied from CRM [id number]. Topic: General - Inquiry >> Oct 20, 2022  3:02 PM Lennox Pippins wrote: Patient called and stated she missed a call, no documentation of it. Patients callback #[phone]

## 2022-10-20 NOTE — Telephone Encounter (Signed)
Don't see who might of called.

## 2022-10-21 NOTE — Telephone Encounter (Signed)
Patient called back and states she had a voicemail that stated she needed to schedule a f/u with Dr B. Patient has been scheduled for 11/02/2022 for her follow up visit.

## 2022-11-02 ENCOUNTER — Ambulatory Visit: Payer: 59 | Admitting: Family Medicine

## 2022-11-04 ENCOUNTER — Ambulatory Visit: Payer: Self-pay

## 2022-11-04 ENCOUNTER — Ambulatory Visit: Payer: 59 | Admitting: Family Medicine

## 2022-11-04 DIAGNOSIS — E119 Type 2 diabetes mellitus without complications: Secondary | ICD-10-CM | POA: Diagnosis not present

## 2022-11-04 DIAGNOSIS — Z961 Presence of intraocular lens: Secondary | ICD-10-CM | POA: Diagnosis not present

## 2022-11-04 DIAGNOSIS — H43813 Vitreous degeneration, bilateral: Secondary | ICD-10-CM | POA: Diagnosis not present

## 2022-11-04 LAB — HM DIABETES EYE EXAM

## 2022-11-04 NOTE — Telephone Encounter (Signed)
    Chief Complaint: Right leg pain, better today. Canceled today's appointment due to eye appointment at the same time. Will call back to re-schedule. Symptoms: Pain Frequency: Several months per daughter Pertinent Negatives: Patient denies  Disposition: [] ED /[] Urgent Care (no appt availability in office) / [] Appointment(In office/virtual)/ []  [place] [facility]/ [] Home Care/ [] Refused Recommended Disposition /[] [place] [facility]/ [x]  Follow-up with PCP Additional Notes: Will call back for appointment.  Reason for Disposition  [1] MODERATE pain (e.g., interferes with normal activities, limping) AND [2] present > 3 days  Answer Assessment - Initial Assessment Questions 1. ONSET: "When did the pain start?"      Months 2. LOCATION: "Where is the pain located?"      Right leg 3. PAIN: "How bad is the pain?"    (Scale 1-10; or mild, moderate, severe)   -  MILD (1-3): doesn't interfere with normal activities    -  MODERATE (4-7): interferes with normal activities (e.g., work or school) or awakens from sleep, limping    -  SEVERE (8-10): excruciating pain, unable to do any normal activities, unable to walk     Mild-moderate 4. WORK OR EXERCISE: "Has there been any recent work or exercise that involved this part of the body?"      No 5. CAUSE: "What do you think is causing the leg pain?"     Unsure 6. OTHER SYMPTOMS: "Do you have any other symptoms?" (e.g., chest pain, back pain, breathing difficulty, swelling, rash, fever, numbness, weakness)     No 7. PREGNANCY: "Is there any chance you are pregnant?" "When was your last menstrual period?"     No  Protocols used: Leg Pain-A-AH

## 2022-11-08 NOTE — Progress Notes (Unsigned)
Established Patient Office Visit  Subjective   Patient ID: MOVITA OHRT, female    DOB: [birth year]  Age: 82 y.o. MRN: [id number]  No chief complaint on file.   HPI  Discussed the use of AI scribe software for clinical note transcription with the patient, who gave verbal consent to proceed.  History of Present Illness           {History (Optional):23778}  ROS    Objective:     There were no vitals taken for this visit. {Vitals History (Optional):23777}  Physical Exam   No results found for any visits on 11/09/22.  {Labs (Optional):23779}  The ASCVD Risk score (Arnett DK, et al., 2019) failed to calculate for the following reasons:   The 2019 ASCVD risk score is only valid for ages 31 to 56    Assessment & Plan:   Problem List Items Addressed This Visit   None                No follow-ups on file.    [name], MD

## 2022-11-09 ENCOUNTER — Encounter: Payer: Self-pay | Admitting: Family Medicine

## 2022-11-09 ENCOUNTER — Ambulatory Visit (INDEPENDENT_AMBULATORY_CARE_PROVIDER_SITE_OTHER): Payer: 59 | Admitting: Family Medicine

## 2022-11-09 VITALS — BP 136/76 | HR 66 | Temp 98.6°F | Ht 60.0 in | Wt 202.4 lb

## 2022-11-09 DIAGNOSIS — N184 Chronic kidney disease, stage 4 (severe): Secondary | ICD-10-CM

## 2022-11-09 DIAGNOSIS — N1832 Chronic kidney disease, stage 3b: Secondary | ICD-10-CM | POA: Diagnosis not present

## 2022-11-09 DIAGNOSIS — M5441 Lumbago with sciatica, right side: Secondary | ICD-10-CM

## 2022-11-09 DIAGNOSIS — N183 Hypertensive chronic kidney disease with stage 1 through stage 4 chronic kidney disease, or unspecified chronic kidney disease: Secondary | ICD-10-CM

## 2022-11-09 DIAGNOSIS — E1159 Type 2 diabetes mellitus with other circulatory complications: Secondary | ICD-10-CM | POA: Diagnosis not present

## 2022-11-09 DIAGNOSIS — E1169 Type 2 diabetes mellitus with other specified complication: Secondary | ICD-10-CM | POA: Diagnosis not present

## 2022-11-09 DIAGNOSIS — E1122 Type 2 diabetes mellitus with diabetic chronic kidney disease: Secondary | ICD-10-CM | POA: Diagnosis not present

## 2022-11-09 DIAGNOSIS — I129 Hypertensive chronic kidney disease with stage 1 through stage 4 chronic kidney disease, or unspecified chronic kidney disease: Secondary | ICD-10-CM | POA: Diagnosis not present

## 2022-11-09 DIAGNOSIS — Z6839 Body mass index (BMI) 39.0-39.9, adult: Secondary | ICD-10-CM

## 2022-11-09 DIAGNOSIS — I152 Hypertension secondary to endocrine disorders: Secondary | ICD-10-CM | POA: Diagnosis not present

## 2022-11-09 DIAGNOSIS — G8929 Other chronic pain: Secondary | ICD-10-CM

## 2022-11-09 DIAGNOSIS — E66812 Obesity, class 2: Secondary | ICD-10-CM

## 2022-11-09 MED ORDER — DOXAZOSIN MESYLATE 2 MG PO TABS: 2.0000 mg | ORAL_TABLET | Freq: Every day | ORAL | 1 refills | Status: DC

## 2022-11-09 MED ORDER — LOSARTAN POTASSIUM 100 MG PO TABS: 100.0000 mg | ORAL_TABLET | Freq: Every day | ORAL | 2 refills | Status: DC

## 2022-11-09 MED ORDER — METFORMIN HCL 500 MG PO TABS
500.0000 mg | ORAL_TABLET | Freq: Every day | ORAL | 1 refills | Status: DC
Start: 2022-11-09 — End: 2023-06-13

## 2022-11-09 MED ORDER — GABAPENTIN 300 MG PO CAPS: 300.0000 mg | ORAL_CAPSULE | Freq: Two times a day (BID) | ORAL | 1 refills | Status: DC

## 2022-11-09 NOTE — Assessment & Plan Note (Signed)
Discussed importance of healthy weight management Discussed diet and exercise  

## 2022-11-09 NOTE — Assessment & Plan Note (Signed)
Patient stopped Repatha due to side effects. Last cholesterol check was 1.5 years ago. -Order cholesterol panel.

## 2022-11-09 NOTE — Assessment & Plan Note (Signed)
Patient has not seen nephrologist in almost a year. -Encourage patient to schedule appointment with nephrologist. -Order labs including blood counts, kidney and liver function, A1C, cholesterol, and PTH.

## 2022-11-09 NOTE — Assessment & Plan Note (Signed)
Patient has a history of diabetes for over 30 years. Last A1C well controlled. -Order A1C. -Continue Metformin.

## 2022-11-09 NOTE — Assessment & Plan Note (Signed)
Blood pressure initially high but decreased to 136/76 during the visit.  -Continue Losartan, Metop, Cardura, Amlodipine, Torsemide. -Recheck blood pressure in 6 months or sooner if symptoms arise.

## 2022-11-10 LAB — COMPREHENSIVE METABOLIC PANEL
ALT: 10 [IU]/L (ref 0–32)
AST: 12 [IU]/L (ref 0–40)
Albumin: 4.1 g/dL (ref 3.7–4.7)
Alkaline Phosphatase: 54 [IU]/L (ref 44–121)
BUN/Creatinine Ratio: 15 (ref 12–28)
BUN: 27 mg/dL (ref 8–27)
Bilirubin Total: 0.3 mg/dL (ref 0.0–1.2)
CO2: 22 mmol/L (ref 20–29)
Calcium: 9.5 mg/dL (ref 8.7–10.3)
Chloride: 103 mmol/L (ref 96–106)
Creatinine, Ser: 1.76 mg/dL — ABNORMAL HIGH (ref 0.57–1.00)
Globulin, Total: 2.9 g/dL (ref 1.5–4.5)
Glucose: 95 mg/dL (ref 70–99)
Potassium: 5 mmol/L (ref 3.5–5.2)
Sodium: 140 mmol/L (ref 134–144)
Total Protein: 7 g/dL (ref 6.0–8.5)
eGFR: 29 mL/min/{1.73_m2} — ABNORMAL LOW (ref >59)

## 2022-11-10 LAB — LIPID PANEL
Chol/HDL Ratio: 4.4 {ratio} (ref 0.0–4.4)
Cholesterol, Total: 332 mg/dL — ABNORMAL HIGH (ref 100–199)
HDL: 75 mg/dL (ref >39)
LDL Chol Calc (NIH): 241 mg/dL — ABNORMAL HIGH (ref 0–99)
Triglycerides: 98 mg/dL (ref 0–149)
VLDL Cholesterol Cal: 16 mg/dL (ref 5–40)

## 2022-11-10 LAB — HEMOGLOBIN A1C
Est. average glucose Bld gHb Est-mCnc: 137 mg/dL
Hgb A1c MFr Bld: 6.4 % — ABNORMAL HIGH (ref 4.8–5.6)

## 2022-11-10 LAB — CBC WITH DIFFERENTIAL/PLATELET
Basophils Absolute: 0 10*3/uL (ref 0.0–0.2)
Basos: 1 %
EOS (ABSOLUTE): 0.2 10*3/uL (ref 0.0–0.4)
Eos: 3 %
Hematocrit: 32.7 % — ABNORMAL LOW (ref 34.0–46.6)
Hemoglobin: 10.7 g/dL — ABNORMAL LOW (ref 11.1–15.9)
Immature Grans (Abs): 0 10*3/uL (ref 0.0–0.1)
Immature Granulocytes: 0 %
Lymphocytes Absolute: 1.3 10*3/uL (ref 0.7–3.1)
Lymphs: 24 %
MCH: 29.3 pg (ref 26.6–33.0)
MCHC: 32.7 g/dL (ref 31.5–35.7)
MCV: 90 fL (ref 79–97)
Monocytes Absolute: 0.5 10*3/uL (ref 0.1–0.9)
Monocytes: 9 %
Neutrophils Absolute: 3.3 10*3/uL (ref 1.4–7.0)
Neutrophils: 63 %
Platelets: 235 10*3/uL (ref 150–450)
RBC: 3.65 x10E6/uL — ABNORMAL LOW (ref 3.77–5.28)
RDW: 12.6 % (ref 11.7–15.4)
WBC: 5.3 10*3/uL (ref 3.4–10.8)

## 2022-11-10 LAB — MICROALBUMIN / CREATININE URINE RATIO
Creatinine, Urine: 122.6 mg/dL
Microalb/Creat Ratio: 14 mg/g{creat} (ref 0–29)
Microalbumin, Urine: 17.3 ug/mL

## 2022-11-10 LAB — PARATHYROID HORMONE, INTACT (NO CA): PTH: 47 pg/mL (ref 15–65)

## 2022-11-10 LAB — URIC ACID: Uric Acid: 6 mg/dL (ref 3.1–7.9)

## 2022-11-10 LAB — MAGNESIUM: Magnesium: 1.9 mg/dL (ref 1.6–2.3)

## 2022-11-11 ENCOUNTER — Encounter: Payer: Self-pay | Admitting: Family Medicine

## 2022-11-22 ENCOUNTER — Telehealth: Payer: Self-pay

## 2022-11-22 NOTE — Telephone Encounter (Signed)
Pt called for lab results. Shared provider's note.  Pt states that she does not have a follow up appt with Cardiology anytime soon. She goes every 6 months. Pt will call Cardiology regarding lab results.  Erasmo Downer, MD 11/11/2022 12:04 PM EDT Back to Top    Normal/stable labs, except high cholesterol.  I know the medications have caused side effects, so continue to work with cardiology.

## 2022-12-01 ENCOUNTER — Other Ambulatory Visit: Payer: Self-pay | Admitting: Cardiovascular Disease

## 2022-12-01 DIAGNOSIS — N183 Chronic kidney disease, stage 3 unspecified: Secondary | ICD-10-CM

## 2023-01-12 ENCOUNTER — Other Ambulatory Visit: Payer: Self-pay | Admitting: Family Medicine

## 2023-01-12 ENCOUNTER — Other Ambulatory Visit: Payer: Self-pay | Admitting: Physician Assistant

## 2023-01-12 DIAGNOSIS — I10 Essential (primary) hypertension: Secondary | ICD-10-CM

## 2023-01-12 DIAGNOSIS — K219 Gastro-esophageal reflux disease without esophagitis: Secondary | ICD-10-CM

## 2023-01-12 NOTE — Telephone Encounter (Signed)
Requested Prescriptions  Pending Prescriptions Disp Refills   amLODipine (NORVASC) 10 MG tablet [Pharmacy Med Name: AMLODIPINE BESYLATE 10 MG TAB] 90 tablet 0    Sig: TAKE 1 TABLET BY MOUTH EVERY DAY     Cardiovascular: Calcium Channel Blockers 2 Passed - 01/12/2023  1:41 PM      Passed - Last BP in normal range    BP Readings from Last 1 Encounters:  11/09/22 136/76         Passed - Last Heart Rate in normal range    Pulse Readings from Last 1 Encounters:  11/09/22 66         Passed - Valid encounter within last 6 months    Recent Outpatient Visits           2 months ago Benign hypertension with CKD (chronic kidney disease) stage III [facility])   [place] [facility] [place], Marzella Schlein, MD   10 months ago Pre-op evaluation   [place] [facility] [place], Marzella Schlein, MD   11 months ago Diastasis recti   [facility] [place], Marzella Schlein, MD   1 year ago Type 2 diabetes mellitus with stage 3b chronic kidney disease, without long-term current use of insulin [facility])   [place] [facility] Caro Laroche, DO   1 year ago Encounter for [employer] annual wellness exam   [place] [facility] [place], Marzella Schlein, MD       Future Appointments             In 4 months [place], Marzella Schlein, MD [facility]

## 2023-01-21 DIAGNOSIS — M1711 Unilateral primary osteoarthritis, right knee: Secondary | ICD-10-CM | POA: Diagnosis not present

## 2023-02-08 DIAGNOSIS — N1832 Chronic kidney disease, stage 3b: Secondary | ICD-10-CM | POA: Diagnosis not present

## 2023-02-08 DIAGNOSIS — R600 Localized edema: Secondary | ICD-10-CM | POA: Diagnosis not present

## 2023-02-08 DIAGNOSIS — E1122 Type 2 diabetes mellitus with diabetic chronic kidney disease: Secondary | ICD-10-CM | POA: Diagnosis not present

## 2023-02-08 DIAGNOSIS — I10 Essential (primary) hypertension: Secondary | ICD-10-CM | POA: Diagnosis not present

## 2023-02-14 DIAGNOSIS — E1122 Type 2 diabetes mellitus with diabetic chronic kidney disease: Secondary | ICD-10-CM | POA: Diagnosis not present

## 2023-02-14 DIAGNOSIS — I10 Essential (primary) hypertension: Secondary | ICD-10-CM | POA: Diagnosis not present

## 2023-02-14 DIAGNOSIS — R600 Localized edema: Secondary | ICD-10-CM | POA: Diagnosis not present

## 2023-02-14 DIAGNOSIS — N1832 Chronic kidney disease, stage 3b: Secondary | ICD-10-CM | POA: Diagnosis not present

## 2023-05-17 ENCOUNTER — Ambulatory Visit: Payer: Self-pay

## 2023-05-17 VITALS — BP 144/82 | Ht 60.0 in | Wt 203.2 lb

## 2023-05-17 DIAGNOSIS — Z0000 Encounter for general adult medical examination without abnormal findings: Secondary | ICD-10-CM | POA: Diagnosis not present

## 2023-05-17 DIAGNOSIS — Z1231 Encounter for screening mammogram for malignant neoplasm of breast: Secondary | ICD-10-CM

## 2023-05-17 NOTE — Progress Notes (Signed)
 Subjective:   Miranda Olsen is a 83 y.o. who presents for a Medicare Wellness preventive visit.  Visit Complete: In person  Persons Participating in Visit: Patient.  AWV Questionnaire: No: Patient Medicare AWV questionnaire was not completed prior to this visit.  Cardiac Risk Factors include: advanced age (>31men, >16 women);dyslipidemia;diabetes mellitus;hypertension;sedentary lifestyle;obesity (BMI >30kg/m2)     Objective:    Today's Vitals   05/17/23 1118 05/17/23 1123  BP: (!) 144/82   Weight: 203 lb 3.2 oz (92.2 kg)   Height: 5' (1.524 m)   PainSc:  4    Body mass index is 39.68 kg/m.     05/17/2023   11:34 AM 05/11/2022   11:52 AM 03/30/2022   10:11 AM 03/22/2022    3:43 PM 04/09/2020    2:15 PM 03/14/2019   10:30 AM 04/01/2018    9:58 AM  Advanced Directives  Does Patient Have a Medical Advance Directive? No No No No No No No  Would patient like information on creating a medical advance directive? No - Patient declined  No - Patient declined No - Patient declined No - Patient declined No - Patient declined     Current Medications (verified) Outpatient Encounter Medications as of 05/17/2023  Medication Sig   acetaminophen  (TYLENOL ) 325 MG tablet Take 650 mg by mouth every 6 (six) hours as needed.   amLODipine  (NORVASC ) 10 MG tablet TAKE 1 TABLET BY MOUTH EVERY DAY   cholecalciferol  (VITAMIN D ) 1000 UNITS tablet Take 1,000 Units by mouth daily. Pt taking 2000 daily   Cinnamon 500 MG capsule Take 500 mg by mouth daily.   Cyanocobalamin  (VITAMIN B 12 PO) Take 2,000 Units by mouth daily.   doxazosin  (CARDURA ) 2 MG tablet Take 1 tablet (2 mg total) by mouth daily.   ferrous sulfate  325 (65 FE) MG tablet TAKE 1 TABLET BY MOUTH EVERY DAY   Garlic 1000 MG CAPS Take by mouth daily.   losartan  (COZAAR ) 100 MG tablet Take 1 tablet (100 mg total) by mouth daily.   metFORMIN  (GLUCOPHAGE ) 500 MG tablet Take 1 tablet (500 mg total) by mouth daily with breakfast.   metoprolol   succinate (TOPROL -XL) 50 MG 24 hr tablet TAKE 1 TABLET BY MOUTH EVERY DAY WITH OR IMMEDIATELY FOLLOWING A MEAL   Omega-3 1000 MG CAPS Take 2 capsules by mouth daily.   omeprazole  (PRILOSEC) 40 MG capsule TAKE 1 CAPSULE BY MOUTH EVERY DAY   torsemide  (DEMADEX ) 10 MG tablet Take 10 mg by mouth every other day.   gabapentin  (NEURONTIN ) 300 MG capsule Take 1 capsule (300 mg total) by mouth 2 (two) times daily. (Patient not taking: Reported on 05/17/2023)   No facility-administered encounter medications on file as of 05/17/2023.    Allergies (verified) Clonidine  derivatives and Amoxicillin   History: Past Medical History:  Diagnosis Date   Chronic kidney disease    Diabetes mellitus without complication (HCC)    GERD (gastroesophageal reflux disease)    Hypercholesterolemia    Hypertension    Syncope    Past Surgical History:  Procedure Laterality Date   COLONOSCOPY WITH PROPOFOL  N/A 01/27/2015   Procedure: COLONOSCOPY WITH PROPOFOL ;  Surgeon: Luella Sager, MD;  Location: [facility] ENDOSCOPY;  Service: Endoscopy;  Laterality: N/A;   EYE SURGERY Left 01/21/2009    cataract surgery    EYE SURGERY Right 04/08/2009   cataract surgery   INSERTION OF MESH N/A 03/30/2022   Procedure: INSERTION OF MESH;  Surgeon: Emmalene Hare, MD;  Location: [facility]  ORS;  Service: General;  Laterality: N/A;   REPLACEMENT TOTAL KNEE Left 10/08/2009   Dr. Aubry Blase   XI ROBOTIC ASSISTED VENTRAL HERNIA N/A 03/30/2022   Procedure: XI ROBOTIC ASSISTED VENTRAL HERNIA & plication of diastatis recti;  Surgeon: Emmalene Hare, MD;  Location: [facility] ORS;  Service: General;  Laterality: N/A;   Family History  Problem Relation Age of Onset   Parkinsonism Mother    Leukemia Father    Cancer Sister        breast   Breast cancer Sister 33   Congestive Heart Failure Sister    Congestive Heart Failure Sister    Lung cancer Brother    Breast cancer Maternal Aunt 50   Breast cancer Other 40   Social History   Socioeconomic  History   Marital status: Widowed    Spouse name: Not on file   Number of children: 2   Years of education: H/S   Highest education level: 11th grade  Occupational History   Occupation: Retired  Tobacco Use   Smoking status: Never    Passive exposure: Never   Smokeless tobacco: Never  Vaping Use   Vaping status: Never Used  Substance and Sexual Activity   Alcohol use: Not Currently    Comment: occasionally 1 glass of wine   Drug use: No   Sexual activity: Not on file  Other Topics Concern   Not on file  Social History Narrative   Lives at home with daughter   Social Drivers of Health   Financial Resource Strain: Low Risk  (05/17/2023)   Overall Financial Resource Strain (CARDIA)    Difficulty of Paying Living Expenses: Not hard at all  Food Insecurity: No Food Insecurity (05/17/2023)   Hunger Vital Sign    Worried About Running Out of Food in the Last Year: Never true    Ran Out of Food in the Last Year: Never true  Transportation Needs: No Transportation Needs (05/17/2023)   PRAPARE - Administrator, Civil Service (Medical): No    Lack of Transportation (Non-Medical): No  Physical Activity: Insufficiently Active (05/17/2023)   Exercise Vital Sign    Days of Exercise per Week: 3 days    Minutes of Exercise per Session: 20 min  Stress: No Stress Concern Present (05/17/2023)   [employer] of Occupational Health - Occupational Stress Questionnaire    Feeling of Stress : Not at all  Social Connections: Moderately Integrated (05/17/2023)   Social Connection and Isolation Panel [NHANES]    Frequency of Communication with Friends and Family: More than three times a week    Frequency of Social Gatherings with Friends and Family: More than three times a week    Attends Religious Services: More than 4 times per year    Active Member of [employer] or Organizations: No    Attends Engineer, structural: Never    Marital Status: Married    Tobacco  Counseling Counseling given: Not Answered    Clinical Intake:  Pre-visit preparation completed: Yes  Pain : 0-10 Pain Score: 4  Pain Type: Chronic pain Pain Location: Knee Pain Orientation: Right Pain Radiating Towards: leg Pain Descriptors / Indicators: Aching, Discomfort, Constant Pain Onset: More than a month ago Pain Frequency: Constant Pain Relieving Factors: tylenol  500mg  once per day  Pain Relieving Factors: tylenol  500mg  once per day  BMI - recorded: 39.68 Nutritional Status: BMI > 30  Obese Nutritional Risks: None Diabetes: Yes CBG done?: No Did pt. bring in CBG  monitor from home?: No  Lab Results  Component Value Date   HGBA1C 6.4 (H) 11/09/2022   HGBA1C 6.6 (H) 03/16/2022   HGBA1C 6.3 (A) 11/17/2021     How often do you need to have someone help you when you read instructions, pamphlets, or other written materials from your doctor or pharmacy?: 1 - Never  Interpreter Needed?: No  Information entered by :: Dellie Fergusson, LPN   Activities of Daily Living    05/17/2023   11:35 AM  In your present state of health, do you have any difficulty performing the following activities:  Hearing? 0  Vision? 0  Difficulty concentrating or making decisions? 0  Walking or climbing stairs? 1  Comment KNEE PAIN  Dressing or bathing? 0  Doing errands, shopping? 0  Preparing Food and eating ? N  Using the Toilet? N  In the past six months, have you accidently leaked urine? N  Do you have problems with loss of bowel control? N  Managing your Medications? N  Managing your Finances? N  Housekeeping or managing your Housekeeping? N    Patient Care Team: Mazie Speed, MD as PCP - General (Family Medicine) Clair Crews, MD as Referring Physician (Ophthalmology) Rica Chalet, MD (Nephrology) Devorah Fonder, MD as Consulting Physician (Cardiology)  Indicate any recent Medical Services you may have received from other than [facility] providers in the past  year (date may be approximate).     Assessment:   This is a routine wellness examination for Minette.  Hearing/Vision screen Hearing Screening - Comments:: NO AIDS Vision Screening - Comments:: WEARS GLASSES ALL THE TIME- [email or web address]   Goals Addressed             This Visit's Progress    DIET - INCREASE WATER INTAKE         Depression Screen     05/17/2023   11:32 AM 11/09/2022    8:47 AM 05/11/2022   11:44 AM 03/16/2022    9:51 AM 11/17/2021   10:51 AM 04/17/2021    8:41 AM 05/20/2020    1:29 PM  PHQ 2/9 Scores  PHQ - 2 Score 0 0 0 0 0 0 0  PHQ- 9 Score 0 2 3 4 5 1 3     Fall Risk     05/17/2023   11:35 AM 11/09/2022    8:47 AM 05/11/2022   11:41 AM 03/16/2022    9:51 AM 11/17/2021   10:51 AM  Fall Risk   Falls in the past year? 0 0 0 0 0  Number falls in past yr: 0 0 0 0 0  Injury with Fall? 0 0 0 0 0  Risk for fall due to : No Fall Risks  No Fall Risks No Fall Risks No Fall Risks  Follow up Falls prevention discussed;Falls evaluation completed  Education provided;Falls prevention discussed Falls evaluation completed Falls evaluation completed    MEDICARE RISK AT HOME:  Medicare Risk at Home Any stairs in or around the home?: No If so, are there any without handrails?: No Home free of loose throw rugs in walkways, pet beds, electrical cords, etc?: Yes Adequate lighting in your home to reduce risk of falls?: Yes Life alert?: No Use of a cane, walker or w/c?: Yes (CANE WHEN OUT- DOESN'T USE AT HOME) Grab bars in the bathroom?: Yes Shower chair or bench in shower?: No Elevated toilet seat or a handicapped toilet?: No  TIMED UP AND GO:  Was the test performed?  Yes  Length of time to ambulate 10 feet: 6 sec Gait slow and steady with assistive device  Cognitive Function: 6CIT completed        05/17/2023   11:38 AM 05/11/2022   11:57 AM 04/17/2021    8:43 AM 03/14/2019   10:34 AM 03/10/2018    9:43 AM  6CIT Screen  What Year? 0 points 0 points 0 points 0  points 0 points  What month? 0 points 0 points 0 points 0 points 0 points  What time? 0 points 0 points 0 points 0 points 0 points  Count back from 20 0 points 0 points 0 points 0 points 0 points  Months in reverse 0 points 0 points 0 points 0 points 0 points  Repeat phrase 0 points 0 points 0 points 0 points 0 points  Total Score 0 points 0 points 0 points 0 points 0 points    Immunizations Immunization History  Administered Date(s) Administered   Moderna Covid-19 Fall Seasonal Vaccine 40yrs & older 03/10/2022   Moderna Covid-19 Vaccine Bivalent Booster 76yrs & up 03/06/2021   Moderna Sars-Covid-2 Vaccination 02/06/2019, 03/06/2019, 11/28/2019   Pfizer(Comirnaty)Fall Seasonal Vaccine 12 years and older 06/25/2020   Pneumococcal Conjugate-13 10/03/2013   Pneumococcal Polysaccharide-23 03/25/2011   Zoster Recombinant(Shingrix) 10/21/2021, 01/28/2022    Screening Tests Health Maintenance  Topic Date Due   DTaP/Tdap/Td (1 - Tdap) Never done   COVID-19 Vaccine (7 - 2024-25 season) 09/26/2022   HEMOGLOBIN A1C  05/10/2023   MAMMOGRAM  07/07/2023   INFLUENZA VACCINE  08/26/2023   OPHTHALMOLOGY EXAM  11/04/2023   Diabetic kidney evaluation - eGFR measurement  11/09/2023   Diabetic kidney evaluation - Urine ACR  11/09/2023   FOOT EXAM  11/09/2023   DEXA SCAN  04/09/2024   Medicare Annual Wellness (AWV)  05/16/2024   Pneumonia Vaccine 22+ Years old  Completed   Zoster Vaccines- Shingrix  Completed   HPV VACCINES  Aged Out   Meningococcal B Vaccine  Aged Out    Health Maintenance  Health Maintenance Due  Topic Date Due   DTaP/Tdap/Td (1 - Tdap) Never done   COVID-19 Vaccine (7 - 2024-25 season) 09/26/2022   HEMOGLOBIN A1C  05/10/2023   Health Maintenance Items Addressed: Mammogram ordered- WANTS TO TALK TO MD ABOUT HAVING A COLONOSCOPY SHOTS UP TO DATE, DECLINES FLU SHOTS   Additional Screening:  Vision Screening: Recommended annual ophthalmology exams for early detection  of glaucoma and other disorders of the eye.  Dental Screening: Recommended annual dental exams for proper oral hygiene  Community Resource Referral / Chronic Care Management: CRR required this visit?  No   CCM required this visit?  No     Plan:     I have personally reviewed and noted the following in the patient's chart:   Medical and social history Use of alcohol, tobacco or illicit drugs  Current medications and supplements including opioid prescriptions. Patient is not currently taking opioid prescriptions. Functional ability and status Nutritional status Physical activity Advanced directives List of other physicians Hospitalizations, surgeries, and ER visits in previous 12 months Vitals Screenings to include cognitive, depression, and falls Referrals and appointments  In addition, I have reviewed and discussed with patient certain preventive protocols, quality metrics, and best practice recommendations. A written personalized care plan for preventive services as well as general preventive health recommendations were provided to patient.     Pinky Bright, LPN   03/10/863   After Visit Summary: (In Person-Declined) Patient declined  AVS at this time.  Notes:  MAMMOGRAM ORDERED

## 2023-05-17 NOTE — Patient Instructions (Addendum)
 Miranda Olsen , Thank you for taking time to come for your Medicare Wellness Visit. I appreciate your ongoing commitment to your health goals. Please review the following plan we discussed and let me know if I can assist you in the future.   Referrals/Orders/Follow-Ups/Clinician Recommendations: MAMMOGRAM ORDERED  You have an order for:  []   2D Mammogram  [x]   3D Mammogram  []   Bone Density     Please call for appointment:  [facility]  [address] [place] [address] [phone] [facility] [address] [place], [place] [address] [phone] [facility] [address]acey [place], [place] [address] [phone]   Make sure to wear two-piece clothing.  No lotions, powders, or deodorants the day of the appointment. Make sure to bring picture ID and insurance card.  Bring list of medications you are currently taking including any supplements.   Schedule your [place] screening mammogram through MyChart!   Log into your MyChart account.  Go to 'Visit' (or 'Appointments' if on mobile App) --> Schedule an Appointment  Under 'Select a Reason for Visit' choose the Mammogram Screening option.  Complete the pre-visit questions and select the time and place that best fits your schedule.   This is a list of the screening recommended for you and due dates:  Health Maintenance  Topic Date Due   DTaP/Tdap/Td vaccine (1 - Tdap) Never done   COVID-19 Vaccine (7 - 2024-25 season) 09/26/2022   Hemoglobin A1C  05/10/2023   Mammogram  07/07/2023   Flu Shot  08/26/2023   Eye exam for diabetics  11/04/2023   Yearly kidney function blood test for diabetes  11/09/2023   Yearly kidney health urinalysis for diabetes  11/09/2023   Complete foot exam   11/09/2023   DEXA scan (bone density measurement)  04/09/2024   Medicare Annual Wellness Visit  05/16/2024    Pneumonia Vaccine  Completed   Zoster (Shingles) Vaccine  Completed   HPV Vaccine  Aged Out   Meningitis B Vaccine  Aged Out    Advanced directives: (ACP Link)Information on Advanced Care Planning can be found at [place]  Secretary of [facility] Advance Health Care Directives Advance Health Care Directives. [email or web address]   Next Medicare Annual Wellness Visit scheduled for next year: Yes   05/29/24 @ 9:30 AM IN PERSON

## 2023-05-19 ENCOUNTER — Ambulatory Visit (INDEPENDENT_AMBULATORY_CARE_PROVIDER_SITE_OTHER): Payer: Self-pay | Admitting: Family Medicine

## 2023-05-19 ENCOUNTER — Encounter: Payer: Self-pay | Admitting: Family Medicine

## 2023-05-19 VITALS — BP 144/68 | HR 65 | Ht 60.0 in | Wt 203.0 lb

## 2023-05-19 DIAGNOSIS — E538 Deficiency of other specified B group vitamins: Secondary | ICD-10-CM

## 2023-05-19 DIAGNOSIS — E1122 Type 2 diabetes mellitus with diabetic chronic kidney disease: Secondary | ICD-10-CM

## 2023-05-19 DIAGNOSIS — M791 Myalgia, unspecified site: Secondary | ICD-10-CM

## 2023-05-19 DIAGNOSIS — K219 Gastro-esophageal reflux disease without esophagitis: Secondary | ICD-10-CM

## 2023-05-19 DIAGNOSIS — E559 Vitamin D deficiency, unspecified: Secondary | ICD-10-CM | POA: Diagnosis not present

## 2023-05-19 DIAGNOSIS — N1832 Chronic kidney disease, stage 3b: Secondary | ICD-10-CM

## 2023-05-19 DIAGNOSIS — I152 Hypertension secondary to endocrine disorders: Secondary | ICD-10-CM | POA: Diagnosis not present

## 2023-05-19 DIAGNOSIS — N183 Chronic kidney disease, stage 3 unspecified: Secondary | ICD-10-CM

## 2023-05-19 DIAGNOSIS — Z0001 Encounter for general adult medical examination with abnormal findings: Secondary | ICD-10-CM

## 2023-05-19 DIAGNOSIS — E1159 Type 2 diabetes mellitus with other circulatory complications: Secondary | ICD-10-CM

## 2023-05-19 DIAGNOSIS — D539 Nutritional anemia, unspecified: Secondary | ICD-10-CM | POA: Diagnosis not present

## 2023-05-19 DIAGNOSIS — Z0000 Encounter for general adult medical examination without abnormal findings: Secondary | ICD-10-CM

## 2023-05-19 DIAGNOSIS — E66812 Obesity, class 2: Secondary | ICD-10-CM

## 2023-05-19 DIAGNOSIS — E1169 Type 2 diabetes mellitus with other specified complication: Secondary | ICD-10-CM

## 2023-05-19 MED ORDER — DOXAZOSIN MESYLATE 4 MG PO TABS: 4.0000 mg | ORAL_TABLET | Freq: Every day | ORAL | 1 refills | Status: DC

## 2023-05-19 NOTE — Assessment & Plan Note (Signed)
 Ongoing management of type 2 diabetes mellitus. Current medication includes metformin . Recent lab results to be reviewed for A1c levels. - Order A1c test.

## 2023-05-19 NOTE — Assessment & Plan Note (Signed)
 Blood pressure readings consistently in the 140s-150s range. Current medications include amlodipine , losartan , metoprolol , and doxazosin . Discussed increasing doxazosin  from 2 mg to 4 mg to improve blood pressure control and reduce stress on kidneys. She reports no low blood pressure readings at home. Discussed sodium intake and dietary habits to support blood pressure management. - Increase doxazosin  to 4 mg daily. Use current 2 mg tablets until depleted, then switch to 4 mg tablets. - Monitor blood pressure at home and report if it drops low.

## 2023-05-19 NOTE — Patient Instructions (Signed)

## 2023-05-19 NOTE — Assessment & Plan Note (Signed)
 Discussed long term PPI use Did not tolerate every other day dosing of omeprazole  Will try a few weeks of 20mg  dose to see if this will control symptoms

## 2023-05-19 NOTE — Assessment & Plan Note (Signed)
 Adverse reactions to statins and Repatha , including cold symptoms and leg pain. Zetia  was previously tried without success. Currently not on medication for hyperlipidemia due to intolerance. - Monitor cholesterol levels with lab tests.

## 2023-05-19 NOTE — Assessment & Plan Note (Signed)
 Ongoing management of vitamin D  deficiency with current supplementation. Vitamin D  levels to be monitored with lab tests. - Order vitamin D  level test.

## 2023-05-19 NOTE — Assessment & Plan Note (Signed)
 Unable to tolerate statins, zetia , [id number]

## 2023-05-19 NOTE — Assessment & Plan Note (Signed)
 Chronic kidney disease stage 3 with recent improvement in kidney function. Blood pressure control is crucial to prevent further kidney damage. - Increase doxazosin  to 4 mg daily to aid in blood pressure control and protect kidney function. - Order kidney function tests. - f/b Nephro

## 2023-05-19 NOTE — Progress Notes (Signed)
 Complete physical exam   Patient: Miranda Olsen   DOB: Apr 09, 1940   83 y.o. Female  MRN: [id number] Visit Date: 05/19/2023  Today's healthcare provider: Aden Agreste, MD   Chief Complaint  Patient presents with   Annual Exam    Last completed AWV 05/17/23 Diet -  No additional salt/general Exercise - none Feeling - well Sleeping - well Concerns -  acid reflux medication   Medical Management of Chronic Issues    6 month follow-up   Hypertension    Lower leg edema sometimes   Hyperlipidemia    Tingling in fingers and sometimes in toes when standing   Diabetes   Subjective    Miranda Olsen is a 83 y.o. female who presents today for a complete physical exam.   Discussed the use of AI scribe software for clinical note transcription with the patient, who gave verbal consent to proceed.  History of Present Illness   The patient, an 83 year old female with a history of type two diabetes, hypertension, hyperlipidemia, CKD3, anemia, vitamin D  deficiency, and obesity, presents for her annual physical. She expresses concern about her long-term use of omeprazole , which she has been taking since 2011. She reports that she tried to reduce her intake to every other day but was unable to tolerate the change. She also mentions that she has been experiencing consistently high blood pressure, despite being on metoprolol , losartan , and Cardura . The patient also reports knee pain, which has led her to use a cane for mobility. She notes that she has been experiencing some relief from her knee pain.        Last depression screening scores    05/19/2023    8:52 AM 05/17/2023   11:32 AM 11/09/2022    8:47 AM  PHQ 2/9 Scores  PHQ - 2 Score 0 0 0  PHQ- 9 Score 5 0 2   Last fall risk screening    05/19/2023    8:51 AM  Fall Risk   Falls in the past year? 0  Number falls in past yr: 0  Injury with Fall? 0  Risk for fall due to : No Fall Risks  Follow up Falls evaluation  completed        Medications: Outpatient Medications Prior to Visit  Medication Sig   acetaminophen  (TYLENOL ) 325 MG tablet Take 650 mg by mouth every 6 (six) hours as needed.   amLODipine  (NORVASC ) 10 MG tablet TAKE 1 TABLET BY MOUTH EVERY DAY   cholecalciferol  (VITAMIN D ) 1000 UNITS tablet Take 1,000 Units by mouth daily. Pt taking 2000 daily   Cinnamon 500 MG capsule Take 500 mg by mouth daily.   Cyanocobalamin  (VITAMIN B 12 PO) Take 2,000 Units by mouth daily.   ferrous sulfate  325 (65 FE) MG tablet TAKE 1 TABLET BY MOUTH EVERY DAY   Garlic 1000 MG CAPS Take by mouth daily.   losartan  (COZAAR ) 100 MG tablet Take 1 tablet (100 mg total) by mouth daily.   metFORMIN  (GLUCOPHAGE ) 500 MG tablet Take 1 tablet (500 mg total) by mouth daily with breakfast.   metoprolol  succinate (TOPROL -XL) 50 MG 24 hr tablet TAKE 1 TABLET BY MOUTH EVERY DAY WITH OR IMMEDIATELY FOLLOWING A MEAL   Omega-3 1000 MG CAPS Take 2 capsules by mouth daily.   omeprazole  (PRILOSEC) 40 MG capsule TAKE 1 CAPSULE BY MOUTH EVERY DAY   torsemide  (DEMADEX ) 10 MG tablet Take 10 mg by mouth every other day.   [DISCONTINUED] doxazosin  (  CARDURA ) 2 MG tablet Take 1 tablet (2 mg total) by mouth daily.   [DISCONTINUED] gabapentin  (NEURONTIN ) 300 MG capsule Take 1 capsule (300 mg total) by mouth 2 (two) times daily. (Patient not taking: Reported on 05/19/2023)   No facility-administered medications prior to visit.    Review of Systems    Objective    BP (!) 144/68 (BP Location: Left Arm, Patient Position: Sitting, Cuff Size: Large)   Pulse 65   Ht 5' (1.524 m)   Wt 203 lb (92.1 kg)   SpO2 100%   BMI 39.65 kg/m    Physical Exam Vitals reviewed.  Constitutional:      General: She is not in acute distress.    Appearance: Normal appearance. She is well-developed. She is not diaphoretic.  HENT:     Head: Normocephalic and atraumatic.     Right Ear: Tympanic membrane, ear canal and external ear normal.     Left Ear:  Tympanic membrane, ear canal and external ear normal.     Nose: Nose normal.     Mouth/Throat:     Mouth: Mucous membranes are moist.     Pharynx: Oropharynx is clear. No oropharyngeal exudate.  Eyes:     General: No scleral icterus.    Conjunctiva/sclera: Conjunctivae normal.     Pupils: Pupils are equal, round, and reactive to light.  Neck:     Thyroid : No thyromegaly.  Cardiovascular:     Rate and Rhythm: Normal rate and regular rhythm.     Heart sounds: Normal heart sounds. No murmur heard. Pulmonary:     Effort: Pulmonary effort is normal. No respiratory distress.     Breath sounds: Normal breath sounds. No wheezing or rales.  Abdominal:     General: There is no distension.     Palpations: Abdomen is soft.     Tenderness: There is no abdominal tenderness.  Musculoskeletal:        General: No deformity.     Cervical back: Neck supple.     Right lower leg: No edema.     Left lower leg: No edema.  Lymphadenopathy:     Cervical: No cervical adenopathy.  Skin:    General: Skin is warm and dry.     Findings: No rash.  Neurological:     Mental Status: She is alert and oriented to person, place, and time. Mental status is at baseline.     Gait: Gait abnormal (walks with cane).  Psychiatric:        Mood and Affect: Mood normal.        Behavior: Behavior normal.        Thought Content: Thought content normal.      No results found for any visits on 05/19/23.  Assessment & Plan    Routine Health Maintenance and Physical Exam  Exercise Activities and Dietary recommendations  Goals      DIET - INCREASE WATER INTAKE     Exercise 3x per week (30 min per time)     Recommend to exercise for 3 days a week for at least 30 minutes at a time.      Have 3 meals a day     Recommend to decrease portion sizes by eating 3 small healthy meals and at least 2 healthy snacks per day.     Track and Manage My Blood Pressure-Hypertension     Timeframe:  Long-Range Goal Priority:   High Start Date: 10/31/2020  Expected End Date: 10/31/2021                      Follow Up within 90 days   - check blood pressure 3 times per week - write blood pressure results in a log or diary    Why is this important?   You won't feel high blood pressure, but it can still hurt your blood vessels.  High blood pressure can cause heart or kidney problems. It can also cause a stroke.  Making lifestyle changes like losing a little weight or eating less salt will help.  Checking your blood pressure at home and at different times of the day can help to control blood pressure.  If the doctor prescribes medicine remember to take it the way the doctor ordered.  Call the office if you cannot afford the medicine or if there are questions about it.     Notes:         Immunization History  Administered Date(s) Administered   Moderna Covid-19 Fall Seasonal Vaccine 26yrs & older 03/10/2022   Moderna Covid-19 Vaccine Bivalent Booster 42yrs & up 03/06/2021   Moderna Sars-Covid-2 Vaccination 02/06/2019, 03/06/2019, 11/28/2019   Pfizer(Comirnaty)Fall Seasonal Vaccine 12 years and older 06/25/2020   Pneumococcal Conjugate-13 10/03/2013   Pneumococcal Polysaccharide-23 03/25/2011   Zoster Recombinant(Shingrix) 10/21/2021, 01/28/2022    Health Maintenance  Topic Date Due   DTaP/Tdap/Td (1 - Tdap) Never done   COVID-19 Vaccine (7 - 2024-25 season) 09/26/2022   HEMOGLOBIN A1C  05/10/2023   MAMMOGRAM  07/07/2023   INFLUENZA VACCINE  08/26/2023   OPHTHALMOLOGY EXAM  11/04/2023   Diabetic kidney evaluation - eGFR measurement  11/09/2023   Diabetic kidney evaluation - Urine ACR  11/09/2023   FOOT EXAM  11/09/2023   DEXA SCAN  04/09/2024   Medicare Annual Wellness (AWV)  05/16/2024   Pneumonia Vaccine 19+ Years old  Completed   Zoster Vaccines- Shingrix  Completed   HPV VACCINES  Aged Out   Meningococcal B Vaccine  Aged Out    Discussed health benefits of physical  activity, and encouraged her to engage in regular exercise appropriate for her age and condition.  Problem List Items Addressed This Visit       Cardiovascular and Mediastinum   Hypertension associated with diabetes (HCC)   Blood pressure readings consistently in the 140s-150s range. Current medications include amlodipine , losartan , metoprolol , and doxazosin . Discussed increasing doxazosin  from 2 mg to 4 mg to improve blood pressure control and reduce stress on kidneys. She reports no low blood pressure readings at home. Discussed sodium intake and dietary habits to support blood pressure management. - Increase doxazosin  to 4 mg daily. Use current 2 mg tablets until depleted, then switch to 4 mg tablets. - Monitor blood pressure at home and report if it drops low.      Relevant Medications   doxazosin  (CARDURA ) 4 MG tablet   Other Relevant Orders   Comprehensive metabolic panel with GFR     Digestive   Acid reflux   Discussed long term PPI use Did not tolerate every other day dosing of omeprazole  Will try a few weeks of 20mg  dose to see if this will control symptoms        Endocrine   T2DM (type 2 diabetes mellitus) (HCC)   Ongoing management of type 2 diabetes mellitus. Current medication includes metformin . Recent lab results to be reviewed for A1c levels. - Order A1c test.  Relevant Orders   Hemoglobin A1c   Hyperlipidemia associated with type 2 diabetes mellitus (HCC)   Adverse reactions to statins and Repatha , including cold symptoms and leg pain. Zetia  was previously tried without success. Currently not on medication for hyperlipidemia due to intolerance. - Monitor cholesterol levels with lab tests.      Relevant Medications   doxazosin  (CARDURA ) 4 MG tablet   Other Relevant Orders   Lipid Panel With LDL/HDL Ratio   CKD stage 3 due to type 2 diabetes mellitus (HCC)   Chronic kidney disease stage 3 with recent improvement in kidney function. Blood pressure control  is crucial to prevent further kidney damage. - Increase doxazosin  to 4 mg daily to aid in blood pressure control and protect kidney function. - Order kidney function tests. - f/b Nephro        Other   Anemia, deficiency   Relevant Orders   Vitamin B12   Avitaminosis D   Ongoing management of vitamin D  deficiency with current supplementation. Vitamin D  levels to be monitored with lab tests. - Order vitamin D  level test.      Relevant Orders   VITAMIN D  25 Hydroxy (Vit-D Deficiency, Fractures)   Obesity   Myalgia due to statin   Unable to tolerate statins, zetia , [id number]      Other Visit Diagnoses       Annual physical exam    -  Primary   Relevant Orders   Comprehensive metabolic panel with GFR   Lipid Panel With LDL/HDL Ratio   Hemoglobin A1c   VITAMIN D  25 Hydroxy (Vit-D Deficiency, Fractures)   Vitamin B12     B12 deficiency              Wellness Visit Annual physical examination conducted. Ongoing management of hypertension, type 2 diabetes mellitus, hyperlipidemia, and chronic kidney disease discussed. Reviewed current medications and adherence. Discussed potential side effects and long-term use of omeprazole , including its impact on calcium  absorption and bone health. She has been on omeprazole  since 2011 and has difficulty discontinuing it. Discussed trial of lower dose omeprazole  (20 mg) to assess tolerance and control of symptoms. Reviewed recent lab results and screenings, including kidney function improvement and previous adverse reactions to statins and Repatha  for hyperlipidemia. - Trial omeprazole  20 mg over-the-counter for 1-2 weeks to assess tolerance and symptom control. - Order labs: vitamin D , cholesterol, kidney and liver function, A1c, and B12. - Provide phone number for mammogram scheduling.  General Health Maintenance Reviewed immunization status and screenings. Up to date on pneumonia and shingles vaccines. Discussed tetanus and RSV vaccines.  Tetanus vaccine recommended every 10 years, available at pharmacy. RSV vaccine recommended to prevent severe illness in older adults. Discussed the potential severity of RSV illness and the protective benefit of the vaccine. - Recommend tetanus vaccine at pharmacy. - Recommend RSV vaccine at pharmacy. - Provide phone number for mammogram scheduling.  Follow-up Follow-up appointments scheduled to monitor chronic conditions an d lab results. - Follow up with Dr. Zelda Hickman in July. - Schedule follow-up appointment in 6 months (October) for routine check-up.        Return in about 6 months (around 11/18/2023) for chronic disease f/u.     Aden Agreste, MD  [facility] [phone] (phone) [phone] (fax)  [facility]

## 2023-05-23 DIAGNOSIS — E1169 Type 2 diabetes mellitus with other specified complication: Secondary | ICD-10-CM | POA: Diagnosis not present

## 2023-05-23 DIAGNOSIS — Z0000 Encounter for general adult medical examination without abnormal findings: Secondary | ICD-10-CM | POA: Diagnosis not present

## 2023-05-23 DIAGNOSIS — I152 Hypertension secondary to endocrine disorders: Secondary | ICD-10-CM | POA: Diagnosis not present

## 2023-05-23 DIAGNOSIS — N1832 Chronic kidney disease, stage 3b: Secondary | ICD-10-CM | POA: Diagnosis not present

## 2023-05-23 DIAGNOSIS — E1159 Type 2 diabetes mellitus with other circulatory complications: Secondary | ICD-10-CM | POA: Diagnosis not present

## 2023-05-23 DIAGNOSIS — E785 Hyperlipidemia, unspecified: Secondary | ICD-10-CM | POA: Diagnosis not present

## 2023-05-24 LAB — VITAMIN D 25 HYDROXY (VIT D DEFICIENCY, FRACTURES): Vit D, 25-Hydroxy: 31.7 ng/mL (ref 30.0–100.0)

## 2023-05-24 LAB — LIPID PANEL WITH LDL/HDL RATIO
Cholesterol, Total: 326 mg/dL — ABNORMAL HIGH (ref 100–199)
HDL: 70 mg/dL (ref >39)
LDL Chol Calc (NIH): 241 mg/dL — ABNORMAL HIGH (ref 0–99)
LDL/HDL Ratio: 3.4 ratio — ABNORMAL HIGH (ref 0.0–3.2)
Triglycerides: 91 mg/dL (ref 0–149)
VLDL Cholesterol Cal: 15 mg/dL (ref 5–40)

## 2023-05-24 LAB — COMPREHENSIVE METABOLIC PANEL WITH GFR
ALT: 8 IU/L (ref 0–32)
AST: 12 IU/L (ref 0–40)
Albumin: 4 g/dL (ref 3.7–4.7)
Alkaline Phosphatase: 54 IU/L (ref 44–121)
BUN/Creatinine Ratio: 17 (ref 12–28)
BUN: 21 mg/dL (ref 8–27)
Bilirubin Total: 0.3 mg/dL (ref 0.0–1.2)
CO2: 23 mmol/L (ref 20–29)
Calcium: 9.1 mg/dL (ref 8.7–10.3)
Chloride: 101 mmol/L (ref 96–106)
Creatinine, Ser: 1.25 mg/dL — ABNORMAL HIGH (ref 0.57–1.00)
Globulin, Total: 2.5 g/dL (ref 1.5–4.5)
Glucose: 97 mg/dL (ref 70–99)
Potassium: 4.6 mmol/L (ref 3.5–5.2)
Sodium: 136 mmol/L (ref 134–144)
Total Protein: 6.5 g/dL (ref 6.0–8.5)
eGFR: 43 mL/min/{1.73_m2} — ABNORMAL LOW (ref >59)

## 2023-05-24 LAB — HEMOGLOBIN A1C
Est. average glucose Bld gHb Est-mCnc: 137 mg/dL
Hgb A1c MFr Bld: 6.4 % — ABNORMAL HIGH (ref 4.8–5.6)

## 2023-05-24 LAB — VITAMIN B12: Vitamin B-12: 750 pg/mL (ref 232–1245)

## 2023-05-27 ENCOUNTER — Telehealth: Payer: Self-pay

## 2023-05-27 NOTE — Telephone Encounter (Signed)
 Copied from CRM [id number]. Topic: Clinical - Lab/Test Results >> May 27, 2023  8:21 AM Earnestine Goes B wrote: Reason for CRM: pt called to speak to nurse regarding her lab results. Please call pt back at [phone] After 3 pm

## 2023-06-03 ENCOUNTER — Other Ambulatory Visit: Payer: Self-pay | Admitting: Cardiovascular Disease

## 2023-06-03 DIAGNOSIS — I129 Hypertensive chronic kidney disease with stage 1 through stage 4 chronic kidney disease, or unspecified chronic kidney disease: Secondary | ICD-10-CM

## 2023-06-04 ENCOUNTER — Other Ambulatory Visit: Payer: Self-pay | Admitting: Family Medicine

## 2023-06-04 DIAGNOSIS — I10 Essential (primary) hypertension: Secondary | ICD-10-CM

## 2023-06-06 NOTE — Telephone Encounter (Signed)
 Requested Prescriptions  Pending Prescriptions Disp Refills   amLODipine  (NORVASC ) 10 MG tablet [Pharmacy Med Name: AMLODIPINE  BESYLATE 10 MG TAB] 90 tablet 1    Sig: TAKE 1 TABLET BY MOUTH EVERY DAY     Cardiovascular: Calcium  Channel Blockers 2 Failed - 06/06/2023  3:39 PM      Failed - Last BP in normal range    BP Readings from Last 1 Encounters:  05/19/23 (!) 144/68         Passed - Last Heart Rate in normal range    Pulse Readings from Last 1 Encounters:  05/19/23 65         Passed - Valid encounter within last 6 months    Recent Outpatient Visits           2 weeks ago Annual physical exam   [facility] [place], Stan Eans, MD

## 2023-06-12 ENCOUNTER — Other Ambulatory Visit: Payer: Self-pay | Admitting: Family Medicine

## 2023-06-12 DIAGNOSIS — E1122 Type 2 diabetes mellitus with diabetic chronic kidney disease: Secondary | ICD-10-CM

## 2023-07-08 ENCOUNTER — Ambulatory Visit
Admission: RE | Admit: 2023-07-08 | Discharge: 2023-07-08 | Disposition: A | Discharge status: Home / Self Care | Source: Ambulatory Visit | Attending: Family Medicine | Admitting: Family Medicine

## 2023-07-08 DIAGNOSIS — Z1231 Encounter for screening mammogram for malignant neoplasm of breast: Secondary | ICD-10-CM | POA: Diagnosis not present

## 2023-07-12 ENCOUNTER — Ambulatory Visit: Payer: Self-pay | Admitting: Family Medicine

## 2023-07-26 ENCOUNTER — Ambulatory Visit: Payer: Self-pay

## 2023-07-26 NOTE — Telephone Encounter (Signed)
 Patient advised as directed by provider. Per patient she is going to check her BP readings at home.Per patient appointment with the nephrologist is cancelled because she is not able to afford paying every time she go see him. Offered patient an appointment with Dr. KATHEE sooner that October and patient decline.

## 2023-07-26 NOTE — Telephone Encounter (Signed)
     FYI Only or Action Required?: Action required by provider: clinical question for provider and update on patient condition.  Patient was last seen in primary care on 05/19/2023 by Myrla Jon HERO, MD. Called Nurse Triage reporting Joint Swelling and Foot Swelling. Symptoms began several months ago. Interventions attempted: Prescription medications: doxazosin  (patient states she is holding her dose on certain days and notices improvement in symptoms), elevates her legs in a recliner. Symptoms are: bilateral ankle and foot swelling mild to moderate unchanged.  Triage Disposition: See PCP When Office is Open (Within 3 Days)  Patient/caregiver understands and will follow disposition?: No, wishes to speak with PCP                           Copied from CRM [id number]. Topic: Clinical - Red Word Triage >> Jul 26, 2023  9:16 AM Antwanette L wrote: Red Word that prompted transfer to Nurse Triage: Patient ankle and foot are swollen. Patient is currently taking doxazosin  (CARDURA ) 4 MG tablet Reason for Disposition  MILD or MODERATE ankle swelling (e.g., can't move joint normally, can't do usual activities) (Exceptions: Itchy, localized swelling; swelling is chronic.)  Answer Assessment - Initial Assessment Questions Patient states she would just like a message sent to her PCP due to it is very expensive for her to come in. She states her insurance has changed and now she receives large medical bills she can not afford.  1. LOCATION: Which ankle is swollen? Where is the swelling?     Bilateral ankles and feet.  2. ONSET: When did the swelling start?     End of April.  3. SWELLING: How bad is the swelling? Or, How large is it? (e.g., mild, moderate, severe; size of localized swelling)    - NONE: No joint swelling.   - LOCALIZED: Localized; small area of puffy or swollen skin (e.g., insect bite, skin irritation).   - MILD: Joint looks or feels mildly swollen or  puffy.   - MODERATE: Swollen; interferes with normal activities (e.g., work or school); decreased range of movement; may be limping.   - SEVERE: Very swollen; can't move swollen joint at all; limping a lot or unable to walk.     Patient states she can walk normally but is having a hard time putting on her shoes.  4. PAIN: Is there any pain? If Yes, ask: How bad is it? (Scale 1-10; or mild, moderate, severe)   - NONE (0): no pain.   - MILD (1-3): doesn't interfere with normal activities.    - MODERATE (4-7): interferes with normal activities (e.g., work or school) or awakens from sleep, limping.    - SEVERE (8-10): excruciating pain, unable to do any normal activities, unable to walk.      Feels like a squeezing, mild.  5. CAUSE: What do you think caused the ankle swelling?     Patient states it is linked to when she takes her doxazosin  and thinks it is from increasing the dosage from 2 to 4mg .  6. OTHER SYMPTOMS: Do you have any other symptoms? (e.g., fever, chest pain, difficulty breathing, calf pain)     Patient  states sometimes she has tingling in her feet. She denies chest pain, difficulty breathing, fever, redness, rash.  7. PREGNANCY: Is there any chance you are pregnant? When was your last menstrual period?     N/A.  Protocols used: Ankle Swelling-A-AH

## 2023-07-26 NOTE — Telephone Encounter (Signed)
 Ok to d/c cardura .  She has f/u appt with nephrology on 7/20, so her BP will be re-checked then. Monitor at home between now and then if able.

## 2023-07-28 NOTE — Telephone Encounter (Signed)
 Noted

## 2023-08-11 ENCOUNTER — Other Ambulatory Visit: Payer: Self-pay | Admitting: Family Medicine

## 2023-08-11 DIAGNOSIS — K219 Gastro-esophageal reflux disease without esophagitis: Secondary | ICD-10-CM

## 2023-08-12 NOTE — Telephone Encounter (Signed)
 Requested Prescriptions  Pending Prescriptions Disp Refills   omeprazole  (PRILOSEC) 40 MG capsule [Pharmacy Med Name: OMEPRAZOLE  DR 40 MG CAPSULE] 90 capsule 3    Sig: TAKE 1 CAPSULE BY MOUTH EVERY DAY     Gastroenterology: Proton Pump Inhibitors Passed - 08/12/2023 11:37 AM      Passed - Valid encounter within last 12 months    Recent Outpatient Visits           2 months ago Annual physical exam   [facility] [place], Jon HERO, MD

## 2023-08-31 ENCOUNTER — Other Ambulatory Visit: Payer: Self-pay | Admitting: Cardiovascular Disease

## 2023-08-31 DIAGNOSIS — I129 Hypertensive chronic kidney disease with stage 1 through stage 4 chronic kidney disease, or unspecified chronic kidney disease: Secondary | ICD-10-CM

## 2023-09-12 NOTE — Telephone Encounter (Unsigned)
 Copied from CRM #[id number]. Topic: General - Other >> Sep 12, 2023  2:00 PM Cleave MATSU wrote: Reason for CRM: carly from [employer] [facility] call wanted to let Dr. B know that she did hear a murmur from pt. She also stated that she isn't having any symptoms.

## 2023-09-17 IMAGING — MG MM DIGITAL SCREENING BILAT W/ TOMO AND CAD
6 of 10 series · 6 of 30 positions shown · non-contrast
Comparison: Previous exam(s).

ACR Breast Density Category a: The breast tissue is almost entirely
fatty.

CLINICAL DATA: Screening.

EXAM:
DIGITAL SCREENING BILATERAL MAMMOGRAM WITH TOMOSYNTHESIS AND CAD
TECHNIQUE: Bilateral screening digital craniocaudal and mediolateral oblique
mammograms were obtained. Bilateral screening digital breast
tomosynthesis was performed. The images were evaluated with
computer-aided detection.

[L CC synth-2D (1 of 2)]
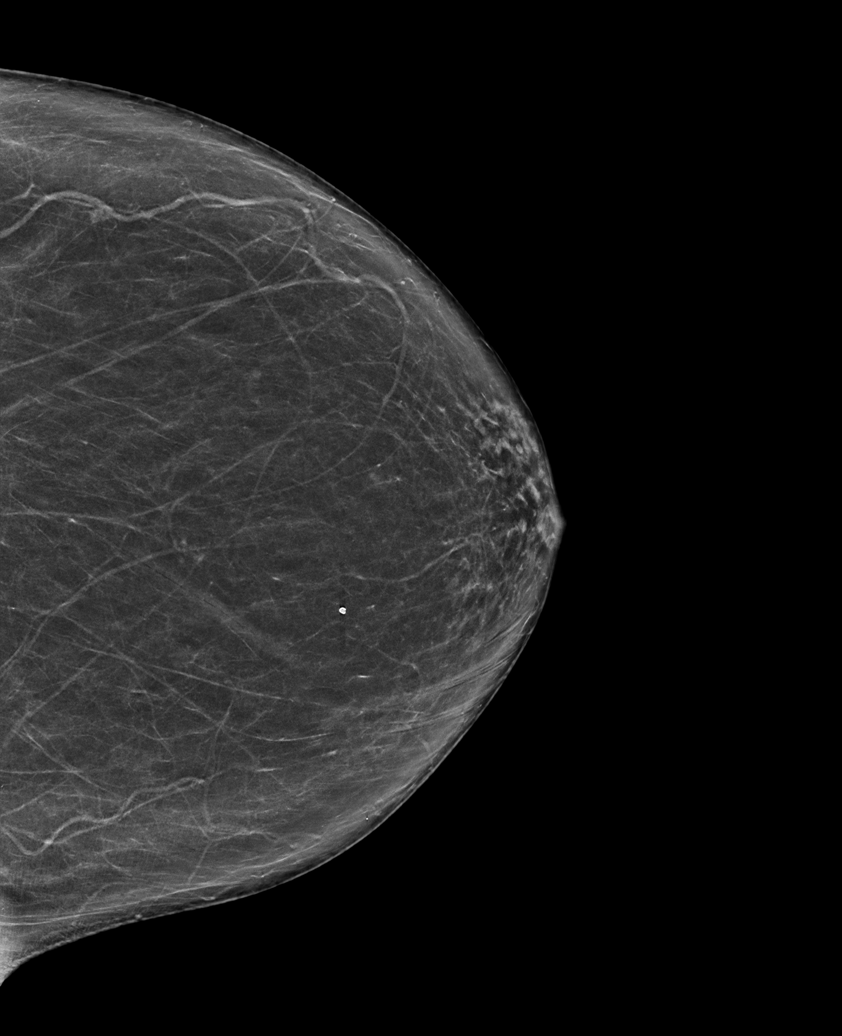

[L CC synth-2D (2 of 2)]
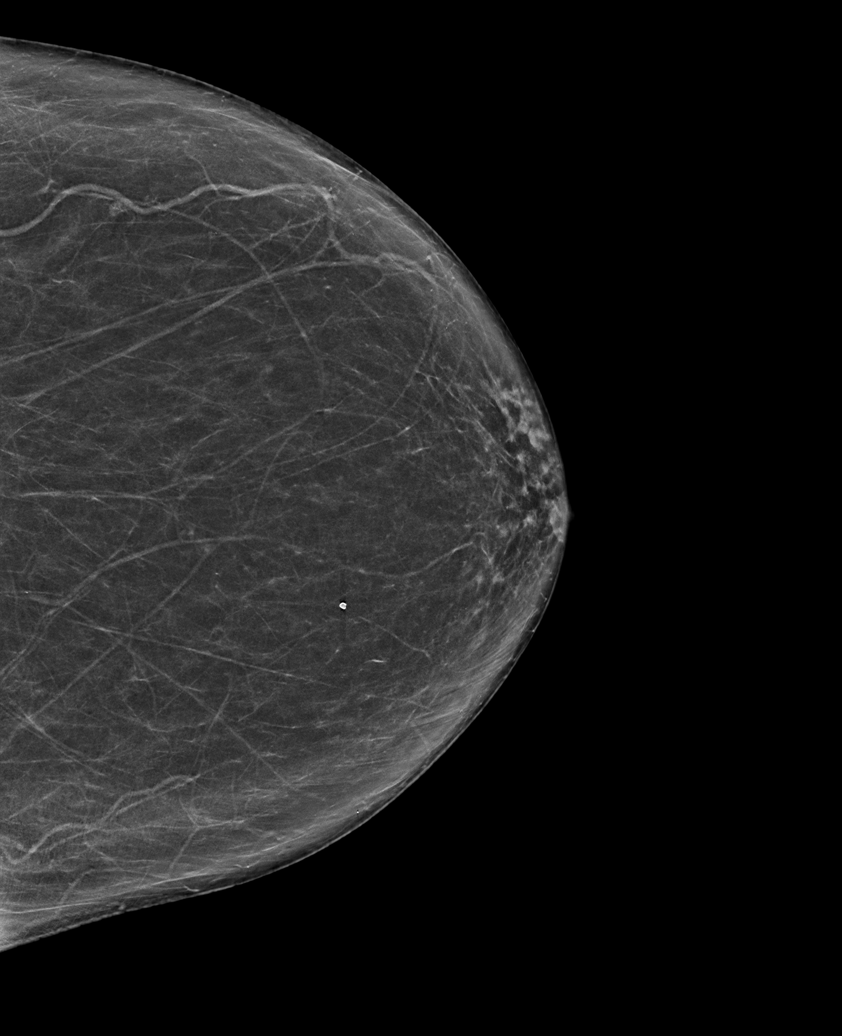

[R CC synth-2D]
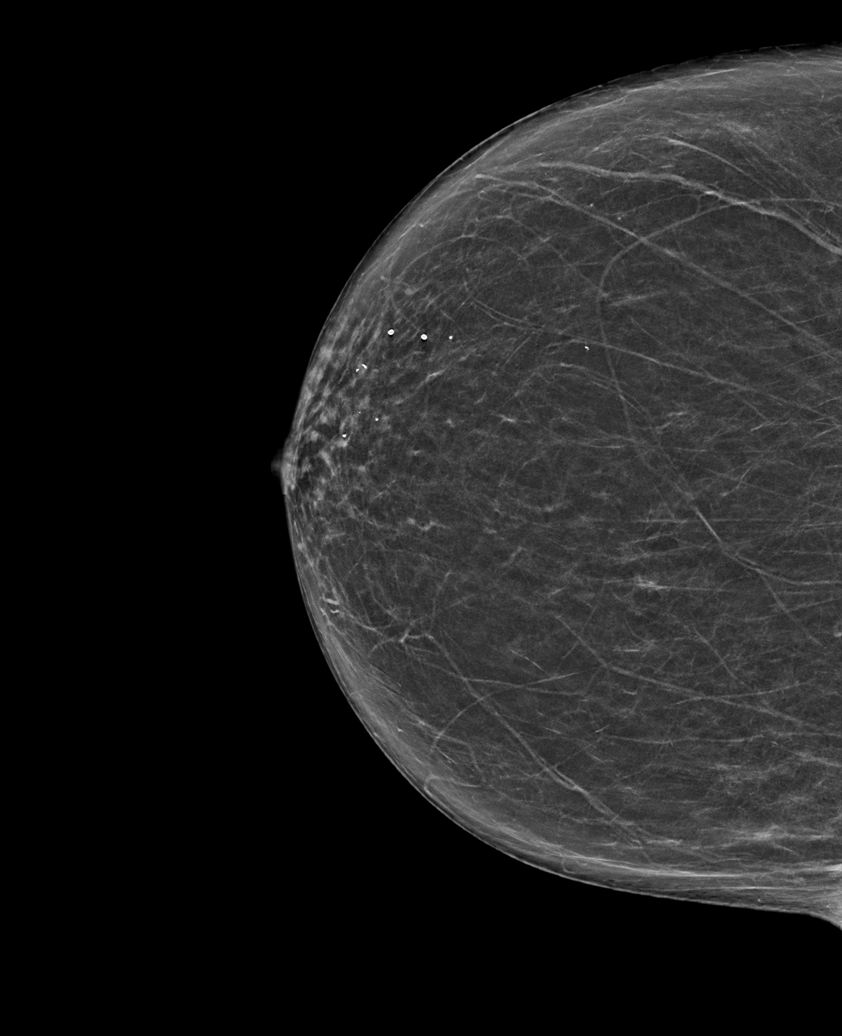

[R MLO synth-2D]
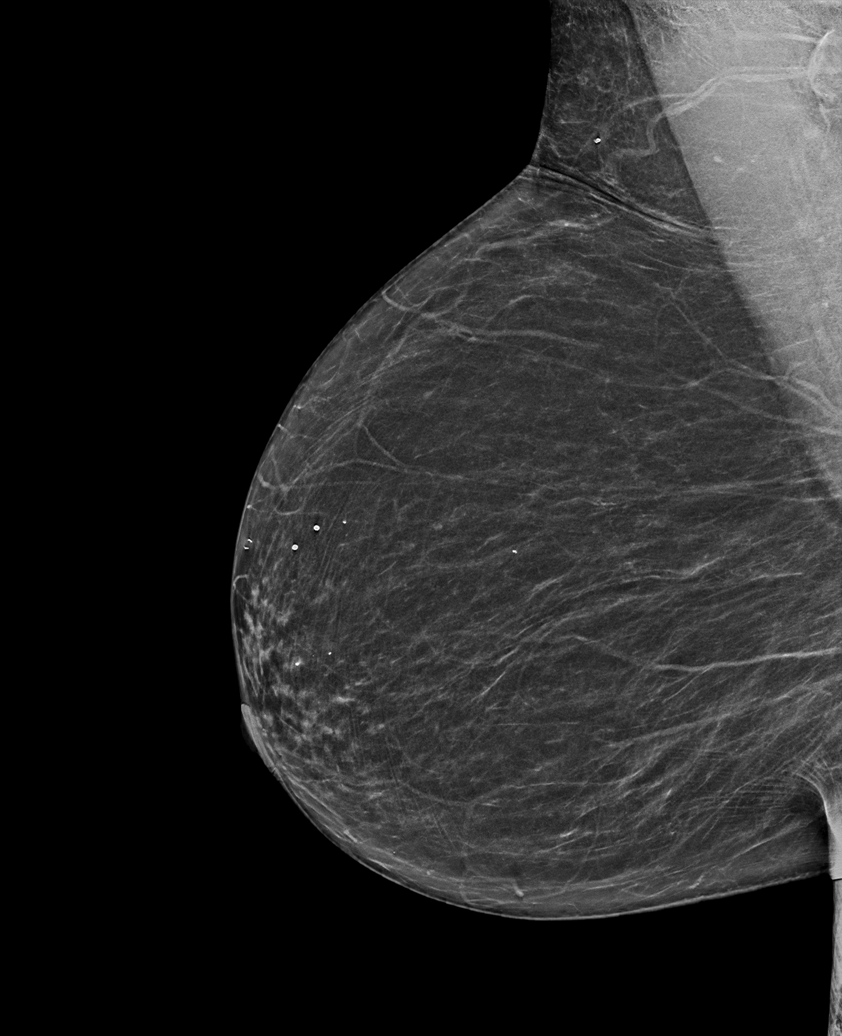

[L MLO synth-2D]
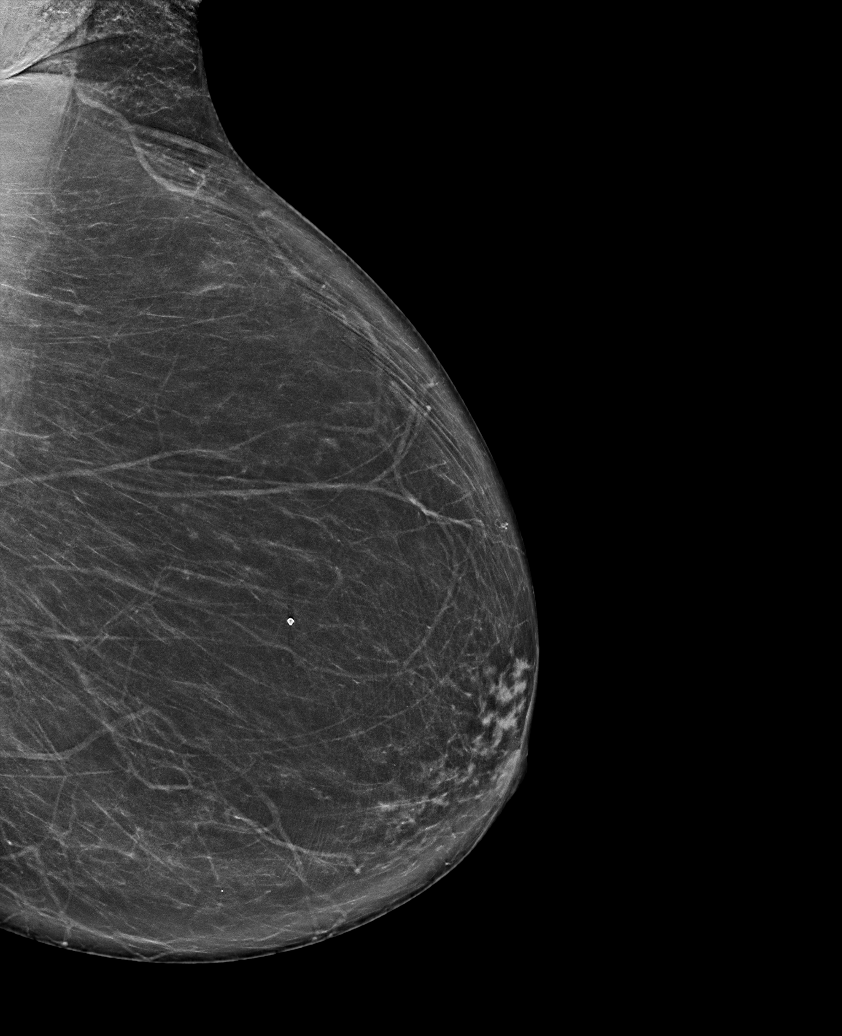

[R MLO tomo · tomo slice 33/66.0]
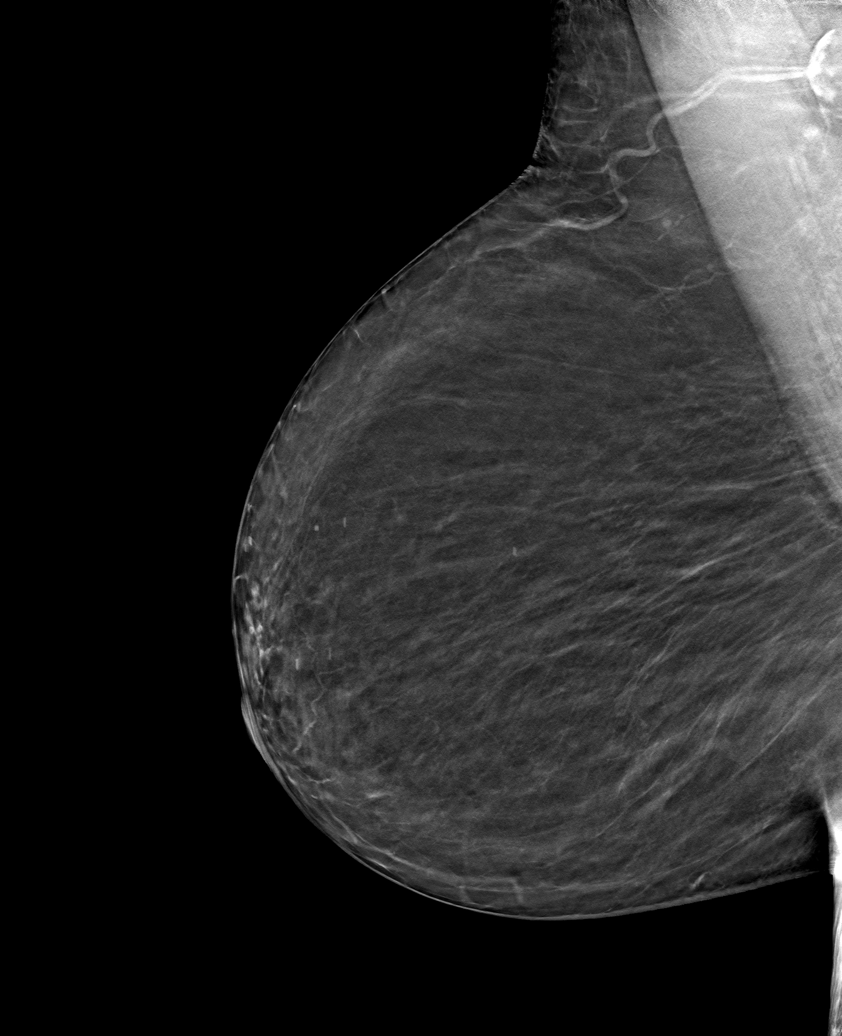

[6 of 30 positions shown; findings below may reference images not displayed]

FINDINGS: There are no findings suspicious for malignancy.
IMPRESSION: No mammographic evidence of malignancy. A result letter of this
screening mammogram will be mailed directly to the patient.

RECOMMENDATION:
Screening mammogram in one year. (Code:0E-3-N98)

BI-RADS CATEGORY  1: Negative.

## 2023-09-28 ENCOUNTER — Other Ambulatory Visit: Payer: Self-pay | Admitting: Cardiovascular Disease

## 2023-09-28 DIAGNOSIS — I129 Hypertensive chronic kidney disease with stage 1 through stage 4 chronic kidney disease, or unspecified chronic kidney disease: Secondary | ICD-10-CM

## 2023-10-20 ENCOUNTER — Other Ambulatory Visit: Payer: Self-pay | Admitting: Family Medicine

## 2023-10-20 DIAGNOSIS — N183 Chronic kidney disease, stage 3 unspecified: Secondary | ICD-10-CM

## 2023-11-09 ENCOUNTER — Encounter: Payer: Self-pay | Admitting: Family Medicine

## 2023-11-09 DIAGNOSIS — E119 Type 2 diabetes mellitus without complications: Secondary | ICD-10-CM | POA: Diagnosis not present

## 2023-11-09 DIAGNOSIS — Z961 Presence of intraocular lens: Secondary | ICD-10-CM | POA: Diagnosis not present

## 2023-11-09 LAB — OPHTHALMOLOGY REPORT-SCANNED

## 2023-11-17 ENCOUNTER — Other Ambulatory Visit: Payer: Self-pay | Admitting: Family Medicine

## 2023-11-18 NOTE — Telephone Encounter (Signed)
 Called pt to schedule OV. Call went to vm - unable to leave message vm not set up.

## 2023-11-18 NOTE — Telephone Encounter (Signed)
 Requested Prescriptions  Pending Prescriptions Disp Refills   doxazosin  (CARDURA ) 4 MG tablet [Pharmacy Med Name: DOXAZOSIN  MESYLATE 4 MG TAB] 30 tablet 0    Sig: TAKE 1 TABLET BY MOUTH EVERY DAY     Cardiovascular:  Alpha Blockers Failed - 11/18/2023  1:27 PM      Failed - Last BP in normal range    BP Readings from Last 1 Encounters:  05/19/23 (!) 144/68         Failed - Valid encounter within last 6 months    Recent Outpatient Visits           6 months ago Annual physical exam   [facility] [place], Jon HERO, MD       Future Appointments             In 1 month Gollan, Timothy J, MD [facility] refill. Patient will need an office visit for additional refills.

## 2023-11-22 ENCOUNTER — Ambulatory Visit: Admitting: Family Medicine

## 2023-11-24 ENCOUNTER — Other Ambulatory Visit: Payer: Self-pay | Admitting: Family Medicine

## 2023-11-24 DIAGNOSIS — E1122 Type 2 diabetes mellitus with diabetic chronic kidney disease: Secondary | ICD-10-CM

## 2023-12-21 ENCOUNTER — Other Ambulatory Visit: Payer: Self-pay | Admitting: Family Medicine

## 2023-12-21 DIAGNOSIS — I10 Essential (primary) hypertension: Secondary | ICD-10-CM

## 2024-01-01 NOTE — Progress Notes (Deleted)
 Cardiology Office Note  Date:  01/01/2024   ID:  Miranda Olsen, DOB 08-28-1940, MRN [id number]  PCP:  Miranda Jon HERO, MD   No chief complaint on file.   HPI:  [name] is a 83 year old woman with past medical history of Hypertension Hyperlipidemia Chronic kidney disease , GFR greater than 50 Diabetes Scoliosis Who presents for hypertension management, remote episode of syncope June 2022, hyperlipidemia, chronic renal insufficiency  Last seen by myself in clinic 6/24  Zio monitor: NSR, rare PACs Fell off after 6 days  Some chest discomfort, atypical, presenting at rest in the evenings Reports she is on PPI Reports that she got anxious after visiting nurse appreciated irregular pulse Was told chest pain might be cardiac related  Able to ambulate without chest pain concerning for angina Mild shortness of breath on exertion  Limited mobility secondary to chronic back pain Sleeps in recliner with her feet up Minimal leg edema  Statin myalgias  did not tolerate Crestor  or Zetia   Labs reviewed: CR 1.67 A1C 6.6 Cholesterol 300  EKG personally reviewed by myself on todays visit Normal sinus rhythm rate 75 bpm no significant ST-T wave changes  Other past medical history reviewed 1 episode of syncope 06/2020 Was taking care of her granddaughter, granddaughter was up all night she did not sleep very well Miranda Olsen was exhausted that morning, did not get anything to eat or drink, took her morning medications Reports that church was hot, was back with the children, sitting Felt very tired, started developing discomfort in her shoulders all over  Next thing she knows, she she was out for several seconds and does not remember what happens EMTs come out, blood pressure EKG normal did not want to go to the hospital. blood pressure being 126/93. In retrospect she does report taking medications on an empty stomach  Never got to drink my coffee  Since then  no further episodes, denies any orthostasis symptoms Blood pressure typically 130  Stressors, son passed away 11/24/19 CT scan abdomen pelvis March 2020  Small to moderate sized hiatal hernia, umbilical hernia, Tortuous aorta Minimal aortic atherosclerosis  Family history Mother with high blood pressure   PMH:   has a past medical history of Chronic kidney disease, Diabetes mellitus without complication (HCC), GERD (gastroesophageal reflux disease), Hypercholesterolemia, Hypertension, and Syncope.  PSH:    Past Surgical History:  Procedure Laterality Date   COLONOSCOPY WITH PROPOFOL  N/A 01/27/2015   Procedure: COLONOSCOPY WITH PROPOFOL ;  Surgeon: Donnice Vaughn Manes, MD;  Location: [facility] ENDOSCOPY;  Service: Endoscopy;  Laterality: N/A;   EYE SURGERY Left 01/21/2009    cataract surgery    EYE SURGERY Right 04/08/2009   cataract surgery   INSERTION OF MESH N/A 03/30/2022   Procedure: INSERTION OF MESH;  Surgeon: Desiderio Schanz, MD;  Location: [facility] ORS;  Service: General;  Laterality: N/A;   REPLACEMENT TOTAL KNEE Left 10/08/2009   Dr. Mardee   XI ROBOTIC ASSISTED VENTRAL HERNIA N/A 03/30/2022   Procedure: XI ROBOTIC ASSISTED VENTRAL HERNIA & plication of diastatis recti;  Surgeon: Desiderio Schanz, MD;  Location: [facility] ORS;  Service: General;  Laterality: N/A;    Current Outpatient Medications  Medication Sig Dispense Refill   acetaminophen  (TYLENOL ) 325 MG tablet Take 650 mg by mouth every 6 (six) hours as needed.     amLODipine  (NORVASC ) 10 MG tablet TAKE 1 TABLET BY MOUTH EVERY DAY 90 tablet 1   cholecalciferol  (VITAMIN D ) 1000 UNITS tablet Take 1,000  Units by mouth daily. Pt taking 2000 daily     Cinnamon 500 MG capsule Take 500 mg by mouth daily.     Cyanocobalamin  (VITAMIN B 12 PO) Take 2,000 Units by mouth daily.     doxazosin  (CARDURA ) 4 MG tablet TAKE 1 TABLET BY MOUTH EVERY DAY 30 tablet 0   ferrous sulfate  325 (65 FE) MG tablet TAKE 1 TABLET BY MOUTH EVERY DAY 90 tablet 0    Garlic 1000 MG CAPS Take by mouth daily.     losartan  (COZAAR ) 100 MG tablet TAKE 1 TABLET BY MOUTH EVERY DAY 90 tablet 2   metFORMIN  (GLUCOPHAGE ) 500 MG tablet TAKE 1 TABLET BY MOUTH EVERY DAY WITH BREAKFAST 90 tablet 0   metoprolol  succinate (TOPROL -XL) 50 MG 24 hr tablet TAKE 1 TABLET BY MOUTH DAILY WITH OR IMMEDIATELY FOLLOWING A MEAL (CALL [phone] FOR APPT) 90 tablet 1   Omega-3 1000 MG CAPS Take 2 capsules by mouth daily.     omeprazole  (PRILOSEC) 40 MG capsule TAKE 1 CAPSULE BY MOUTH EVERY DAY 90 capsule 3   torsemide  (DEMADEX ) 10 MG tablet Take 10 mg by mouth every other day.     No current facility-administered medications for this visit.    Allergies:   Clonidine  derivatives and Amoxicillin   Social History:  The patient  reports that she has never smoked. She has never been exposed to tobacco smoke. She has never used smokeless tobacco. She reports that she does not currently use alcohol. She reports that she does not use drugs.   Family History:   family history includes Breast cancer (age of onset: 80) in an other family member; Breast cancer (age of onset: 46) in her sister; Breast cancer (age of onset: 61) in her maternal aunt; Cancer in her sister; Congestive Heart Failure in her sister and sister; Leukemia in her father; Lung cancer in her brother; Parkinsonism in her mother.    Review of Systems: Review of Systems  Constitutional: Negative.   HENT: Negative.    Respiratory: Negative.    Cardiovascular: Negative.   Gastrointestinal: Negative.   Musculoskeletal:  Positive for back pain.  Neurological: Negative.   Psychiatric/Behavioral: Negative.    All other systems reviewed and are negative.   PHYSICAL EXAM: VS:  There were no vitals taken for this visit. , BMI There is no height or weight on file to calculate BMI. Constitutional:  oriented to person, place, and time. No distress.  HENT:  Head: Grossly normal Eyes:  no discharge. No scleral icterus.  Neck: No  JVD, no carotid bruits  Cardiovascular: Regular rate and rhythm, no murmurs appreciated Pulmonary/Chest: Clear to auscultation bilaterally, no wheezes or rails Abdominal: Soft.  no distension.  no tenderness.  Musculoskeletal: Normal range of motion Neurological:  normal muscle tone. Coordination normal. No atrophy Skin: Skin warm and dry Psychiatric: normal affect, pleasant   Recent Labs: 05/23/2023: ALT 8; BUN 21; Creatinine, Ser 1.25; Potassium 4.6; Sodium 136    Lipid Panel Lab Results  Component Value Date   CHOL 326 (H) 05/23/2023   HDL 70 05/23/2023   LDLCALC 241 (H) 05/23/2023   TRIG 91 05/23/2023      Wt Readings from Last 3 Encounters:  05/19/23 203 lb (92.1 kg)  05/17/23 203 lb 3.2 oz (92.2 kg)  11/09/22 202 lb 6.4 oz (91.8 kg)     ASSESSMENT AND PLAN:  Problem List Items Addressed This Visit   None  Essential hypertension Blood pressure is well controlled on  today's visit. No changes made to the medications.  Hyperlipidemia Myalgias on Crestor , cholesterol 300  aortic atherosclerosis on CT scan off zetia , leg pain, stopped it Given cholesterol was 300, reports she does not want Repatha   Obesity Calorie restriction recommended, unable to exercise in the setting of chronic back pain  Diabetes type 2 A1c steady 6.3 Chronic renal sufficiency, creatinine stable  Chest discomfort Atypical in nature, presenting at rest in the evenings, resolves without intervention, mild symptoms Recommend she try Mylanta for GERD symptoms when she has discomfort If no improvement in symptoms, could consider stress test Will avoid cardiac CTA in the setting of chronic renal insufficiency   Total encounter time more than 30 minutes  Greater than 50% was spent in counseling and coordination of care with the patient    Signed, Velinda Lunger, M.D., Ph.D. [facility] [place], [place] [phone]

## 2024-01-03 ENCOUNTER — Ambulatory Visit: Admitting: Cardiovascular Disease

## 2024-01-03 DIAGNOSIS — E1169 Type 2 diabetes mellitus with other specified complication: Secondary | ICD-10-CM

## 2024-01-03 DIAGNOSIS — R600 Localized edema: Secondary | ICD-10-CM

## 2024-01-03 DIAGNOSIS — E7800 Pure hypercholesterolemia, unspecified: Secondary | ICD-10-CM

## 2024-01-03 DIAGNOSIS — E1122 Type 2 diabetes mellitus with diabetic chronic kidney disease: Secondary | ICD-10-CM

## 2024-01-03 DIAGNOSIS — E119 Type 2 diabetes mellitus without complications: Secondary | ICD-10-CM

## 2024-01-03 DIAGNOSIS — E1159 Type 2 diabetes mellitus with other circulatory complications: Secondary | ICD-10-CM

## 2024-01-03 DIAGNOSIS — N183 Chronic kidney disease, stage 3 unspecified: Secondary | ICD-10-CM

## 2024-02-14 NOTE — Progress Notes (Unsigned)
 Cardiology Office Note  Date:  02/17/2024   ID:  SHAMYIA GRANDPRE, DOB 1940/10/31, MRN [id number]  PCP:  Myrla Jon HERO, MD   Chief Complaint  Patient presents with   12 month follow up     Patient c/o bilateral LE edema & shortness of breath with walking a long distance.     HPI:  Ms. Charlyne Robertshaw is a 84 year old woman with past medical history of Hypertension Hyperlipidemia Chronic kidney disease , GFR greater than 50 Diabetes Scoliosis Who presents for hypertension management, remote episode of syncope June 2022, hyperlipidemia, chronic renal insufficiency  Last seen by myself in clinic 6/24 Not taking fluid pill Reports that she developed some swelling She was told to stop her Cardura  but she was concerned about her blood pressure She decreased Cardura  2 mg daily from 4 mg Swelling better  In general reports that she does well, lives with her husband  Zio monitor: Results reviewed NSR, rare PACs  Prior history of chest discomfort, atypical, presenting at rest in the evenings, on PPI Recently symptoms stable, no significant discomfort She decreased omeprazole  down to 20 daily  Limited mobility secondary to chronic back pain Sleeps in recliner with her feet up  Statin myalgias  did not tolerate Crestor  or Zetia , repatha  (cold all the time Total chol 326 Interested in other options  Labs reviewed: CR 1.67 A1C 6.6 Cholesterol 326  EKG personally reviewed by myself on todays visit EKG Interpretation Date/Time:  Friday February 17 2024 08:25:18 EST Ventricular Rate:  76 PR Interval:  202 QRS Duration:  92 QT Interval:  396 QTC Calculation: 445 R Axis:   -26  Text Interpretation: Normal sinus rhythm Minimal voltage criteria for LVH, may be normal variant ( R in aVL ) When compared with ECG of 25-Jan-2015 17:42, No significant change was found Confirmed by Perla Lye [phone]) on 02/17/2024 8:55:27 AM   Other past medical history reviewed 1  episode of syncope 06/2020 Was taking care of her granddaughter, granddaughter was up all night she did not sleep very well Ms. Tayler was exhausted that morning, did not get anything to eat or drink, took her morning medications Reports that church was hot, was back with the children, sitting Felt very tired, started developing discomfort in her shoulders all over  Next thing she knows, she she was out for several seconds and does not remember what happens EMTs come out, blood pressure EKG normal did not want to go to the hospital. blood pressure being 126/93. In retrospect she does report taking medications on an empty stomach  Never got to drink my coffee  Since then no further episodes, denies any orthostasis symptoms Blood pressure typically 130  Stressors, son passed away 11-29-2019 CT scan abdomen pelvis March 2020  Small to moderate sized hiatal hernia, umbilical hernia, Tortuous aorta Minimal aortic atherosclerosis  Family history Mother with high blood pressure   PMH:   has a past medical history of Chronic kidney disease, Diabetes mellitus without complication (HCC), GERD (gastroesophageal reflux disease), Hypercholesterolemia, Hypertension, and Syncope.  PSH:    Past Surgical History:  Procedure Laterality Date   COLONOSCOPY WITH PROPOFOL  N/A 01/27/2015   Procedure: COLONOSCOPY WITH PROPOFOL ;  Surgeon: Donnice Vaughn Manes, MD;  Location: [facility] ENDOSCOPY;  Service: Endoscopy;  Laterality: N/A;   EYE SURGERY Left 01/21/2009    cataract surgery    EYE SURGERY Right 04/08/2009   cataract surgery   INSERTION OF MESH N/A 03/30/2022   Procedure: INSERTION  OF MESH;  Surgeon: Desiderio Schanz, MD;  Location: [facility] ORS;  Service: General;  Laterality: N/A;   REPLACEMENT TOTAL KNEE Left 10/08/2009   Dr. Mardee   XI ROBOTIC ASSISTED VENTRAL HERNIA N/A 03/30/2022   Procedure: XI ROBOTIC ASSISTED VENTRAL HERNIA & plication of diastatis recti;  Surgeon: Desiderio Schanz, MD;  Location:  [facility] ORS;  Service: General;  Laterality: N/A;    Current Outpatient Medications  Medication Sig Dispense Refill   acetaminophen  (TYLENOL ) 325 MG tablet Take 650 mg by mouth every 6 (six) hours as needed.     amLODipine  (NORVASC ) 10 MG tablet TAKE 1 TABLET BY MOUTH EVERY DAY 90 tablet 1   cholecalciferol  (VITAMIN D ) 1000 UNITS tablet Take 1,000 Units by mouth daily. Pt taking 2000 daily     Cinnamon 500 MG capsule Take 500 mg by mouth daily.     Cyanocobalamin  (VITAMIN B 12 PO) Take 2,000 Units by mouth daily.     doxazosin  (CARDURA ) 2 MG tablet Take 1 tablet by mouth daily.     ferrous sulfate  325 (65 FE) MG tablet TAKE 1 TABLET BY MOUTH EVERY DAY 90 tablet 0   Garlic 1000 MG CAPS Take by mouth daily.     losartan  (COZAAR ) 100 MG tablet TAKE 1 TABLET BY MOUTH EVERY DAY 90 tablet 2   metFORMIN  (GLUCOPHAGE ) 500 MG tablet TAKE 1 TABLET BY MOUTH EVERY DAY WITH BREAKFAST 90 tablet 0   metoprolol  succinate (TOPROL -XL) 50 MG 24 hr tablet TAKE 1 TABLET BY MOUTH DAILY WITH OR IMMEDIATELY FOLLOWING A MEAL (CALL [phone] FOR APPT) 90 tablet 1   Omega-3 1000 MG CAPS Take 2 capsules by mouth daily.     omeprazole  (PRILOSEC) 40 MG capsule TAKE 1 CAPSULE BY MOUTH EVERY DAY 90 capsule 3   torsemide  (DEMADEX ) 10 MG tablet Take 10 mg by mouth every other day. (Patient not taking: Reported on 02/17/2024)     No current facility-administered medications for this visit.    Allergies:   Clonidine  and derivatives and Amoxicillin   Social History:  The patient  reports that she has never smoked. She has never been exposed to tobacco smoke. She has never used smokeless tobacco. She reports that she does not currently use alcohol. She reports that she does not use drugs.   Family History:   family history includes Breast cancer (age of onset: 13) in an other family member; Breast cancer (age of onset: 67) in her sister; Breast cancer (age of onset: 83) in her maternal aunt; Cancer in her sister; Congestive Heart  Failure in her sister and sister; Leukemia in her father; Lung cancer in her brother; Parkinsonism in her mother.    Review of Systems: Review of Systems  Constitutional: Negative.   HENT: Negative.    Respiratory: Negative.    Cardiovascular: Negative.   Gastrointestinal: Negative.   Musculoskeletal:  Positive for back pain.  Neurological: Negative.   Psychiatric/Behavioral: Negative.    All other systems reviewed and are negative.   PHYSICAL EXAM: VS:  BP 130/84 (BP Location: Left Arm, Patient Position: Sitting, Cuff Size: Normal)   Pulse 76   Ht 5' 2 (1.575 m)   Wt 205 lb 2 oz (93 kg)   SpO2 96%   BMI 37.52 kg/m  , BMI Body mass index is 37.52 kg/m. Constitutional:  oriented to person, place, and time. No distress.  HENT:  Head: Normocephalic and atraumatic.  Eyes:  no discharge. No scleral icterus.  Neck: Normal range of motion.  Neck supple. No JVD present.  Cardiovascular: Normal rate, regular rhythm, normal heart sounds and intact distal pulses. Exam reveals no gallop and no friction rub. No edema No murmur heard. Pulmonary/Chest: Effort normal and breath sounds normal. No stridor. No respiratory distress.  no wheezes.  no rales.  no tenderness.  Abdominal: Soft.  no distension.  no tenderness.  Musculoskeletal: Normal range of motion.  no  tenderness or deformity.  Neurological:  normal muscle tone. Coordination normal. No atrophy Skin: Skin is warm and dry. No rash noted. not diaphoretic.  Psychiatric:  normal mood and affect. behavior is normal. Thought content normal.   Recent Labs: 05/23/2023: ALT 8; BUN 21; Creatinine, Ser 1.25; Potassium 4.6; Sodium 136    Lipid Panel Lab Results  Component Value Date   CHOL 326 (H) 05/23/2023   HDL 70 05/23/2023   LDLCALC 241 (H) 05/23/2023   TRIG 91 05/23/2023      Wt Readings from Last 3 Encounters:  02/17/24 205 lb 2 oz (93 kg)  05/19/23 203 lb (92.1 kg)  05/17/23 203 lb 3.2 oz (92.2 kg)     ASSESSMENT AND  PLAN:  Problem List Items Addressed This Visit   None Visit Diagnoses       Irregular heartbeat    -  Primary   Relevant Orders   EKG 12-Lead (Completed)      Essential hypertension Blood pressure is well controlled on today's visit. No changes made to the medications.  Hyperlipidemia Myalgias on Crestor ,  Did not tolerate Zetia  Did not tolerate Repatha , felt cold cholesterol >300  aortic atherosclerosis on CT scan Recommend she talk with pharmacy whether she is candidate for Leqvio  Obesity We have encouraged continued exercise, careful diet management in an effort to lose weight.  Diabetes type 2 Managed by primary care Chronic renal sufficiency, creatinine improving 1.2 Taking less torsemide , no recent lab work available, scheduled to see primary care shortly  Chest discomfort Atypical in nature, presenting at rest in the evenings,  Symptoms better on PPI Currently asymptomatic, no further workup at this time   Signed, Velinda Lunger, M.D., Ph.D. [facility] [place], [place] [phone]

## 2024-02-16 ENCOUNTER — Ambulatory Visit: Admitting: Family Medicine

## 2024-02-17 ENCOUNTER — Ambulatory Visit: Attending: Cardiovascular Disease | Admitting: Cardiovascular Disease

## 2024-02-17 ENCOUNTER — Encounter: Payer: Self-pay | Admitting: Cardiovascular Disease

## 2024-02-17 VITALS — BP 130/84 | HR 76 | Ht 62.0 in | Wt 205.1 lb

## 2024-02-17 DIAGNOSIS — E1169 Type 2 diabetes mellitus with other specified complication: Secondary | ICD-10-CM

## 2024-02-17 DIAGNOSIS — E785 Hyperlipidemia, unspecified: Secondary | ICD-10-CM | POA: Diagnosis not present

## 2024-02-17 DIAGNOSIS — I499 Cardiac arrhythmia, unspecified: Secondary | ICD-10-CM

## 2024-02-17 MED ORDER — DOXAZOSIN MESYLATE 2 MG PO TABS: 2.0000 mg | ORAL_TABLET | Freq: Every day | ORAL | 3 refills | Status: AC

## 2024-02-17 NOTE — Patient Instructions (Signed)
 Referral to pharmacy to discuss Leqvio coverage   Medication Instructions:  No changes  If you need a refill on your cardiac medications before your next appointment, please call your pharmacy.   Lab work: No new labs needed  Testing/Procedures: No new testing needed  Follow-Up: At [facility], you and your health needs are our priority.  As part of our continuing mission to provide you with exceptional heart care, we have created designated Provider Care Teams.  These Care Teams include your primary Cardiologist (physician) and Advanced Practice Providers (APPs -  Physician Assistants and Nurse Practitioners) who all work together to provide you with the care you need, when you need it.  You will need a follow up appointment in 12 months  Providers on your designated Care Team:   Lonni Meager, NP Bernardino Bring, PA-C [place]ence [place], [place]  COVID-19 Vaccine Information can be found at: [email or web address] For questions related to vaccine distribution or appointments, please email [email or web address] or call [phone].

## 2024-03-02 ENCOUNTER — Other Ambulatory Visit: Payer: Self-pay | Admitting: Family Medicine

## 2024-03-02 DIAGNOSIS — E1122 Type 2 diabetes mellitus with diabetic chronic kidney disease: Secondary | ICD-10-CM

## 2024-03-15 ENCOUNTER — Ambulatory Visit: Admitting: Family Medicine

## 2024-04-10 ENCOUNTER — Ambulatory Visit: Admitting: Pharmacist

## 2024-05-29 ENCOUNTER — Ambulatory Visit
